# Patient Record
Sex: Male | Born: 1975 | Race: White | Hispanic: No | Smoking: Never smoker
Health system: Southern US, Community
[De-identification: ages and names within clinical notes are randomized; demographics above are authoritative.]

## PROBLEM LIST (undated history)

## (undated) DIAGNOSIS — F32A Depression, unspecified: Secondary | ICD-10-CM

## (undated) DIAGNOSIS — R079 Chest pain, unspecified: Secondary | ICD-10-CM

## (undated) DIAGNOSIS — I2510 Atherosclerotic heart disease of native coronary artery without angina pectoris: Secondary | ICD-10-CM

## (undated) DIAGNOSIS — E785 Hyperlipidemia, unspecified: Secondary | ICD-10-CM

## (undated) DIAGNOSIS — K219 Gastro-esophageal reflux disease without esophagitis: Secondary | ICD-10-CM

## (undated) DIAGNOSIS — F419 Anxiety disorder, unspecified: Secondary | ICD-10-CM

## (undated) DIAGNOSIS — F329 Major depressive disorder, single episode, unspecified: Secondary | ICD-10-CM

## (undated) DIAGNOSIS — N200 Calculus of kidney: Secondary | ICD-10-CM

## (undated) DIAGNOSIS — Z9289 Personal history of other medical treatment: Secondary | ICD-10-CM

## (undated) DIAGNOSIS — I700 Atherosclerosis of aorta: Secondary | ICD-10-CM

## (undated) HISTORY — DX: Depression, unspecified: F32.A

## (undated) HISTORY — DX: Hyperlipidemia, unspecified: E78.5

## (undated) HISTORY — DX: Personal history of other medical treatment: Z92.89

## (undated) HISTORY — DX: Calculus of kidney: N20.0

## (undated) HISTORY — PX: CHOLECYSTECTOMY: SHX55

## (undated) HISTORY — PX: OTHER SURGICAL HISTORY: SHX169

## (undated) HISTORY — DX: Atherosclerotic heart disease of native coronary artery without angina pectoris: I25.10

## (undated) HISTORY — PX: VASECTOMY: SHX75

## (undated) HISTORY — DX: Gastro-esophageal reflux disease without esophagitis: K21.9

## (undated) HISTORY — DX: Anxiety disorder, unspecified: F41.9

## (undated) HISTORY — DX: Chest pain, unspecified: R07.9

## (undated) HISTORY — DX: Major depressive disorder, single episode, unspecified: F32.9

---

## 2011-02-06 HISTORY — PX: CHOLECYSTECTOMY: SHX55

## 2017-06-12 ENCOUNTER — Encounter: Payer: Self-pay | Admitting: Internal Medicine

## 2017-06-12 ENCOUNTER — Telehealth: Payer: Self-pay | Admitting: *Deleted

## 2017-06-12 ENCOUNTER — Ambulatory Visit (INDEPENDENT_AMBULATORY_CARE_PROVIDER_SITE_OTHER): Payer: PRIVATE HEALTH INSURANCE | Admitting: Internal Medicine

## 2017-06-12 VITALS — BP 136/78 | HR 76 | Temp 98.5°F | Ht 66.0 in | Wt 217.6 lb

## 2017-06-12 DIAGNOSIS — Z87442 Personal history of urinary calculi: Secondary | ICD-10-CM | POA: Diagnosis not present

## 2017-06-12 DIAGNOSIS — D229 Melanocytic nevi, unspecified: Secondary | ICD-10-CM

## 2017-06-12 DIAGNOSIS — N419 Inflammatory disease of prostate, unspecified: Secondary | ICD-10-CM | POA: Diagnosis not present

## 2017-06-12 DIAGNOSIS — R6882 Decreased libido: Secondary | ICD-10-CM | POA: Insufficient documentation

## 2017-06-12 DIAGNOSIS — M25542 Pain in joints of left hand: Secondary | ICD-10-CM

## 2017-06-12 DIAGNOSIS — D239 Other benign neoplasm of skin, unspecified: Secondary | ICD-10-CM

## 2017-06-12 DIAGNOSIS — N401 Enlarged prostate with lower urinary tract symptoms: Secondary | ICD-10-CM | POA: Diagnosis not present

## 2017-06-12 DIAGNOSIS — E785 Hyperlipidemia, unspecified: Secondary | ICD-10-CM | POA: Insufficient documentation

## 2017-06-12 DIAGNOSIS — F419 Anxiety disorder, unspecified: Secondary | ICD-10-CM | POA: Insufficient documentation

## 2017-06-12 DIAGNOSIS — E559 Vitamin D deficiency, unspecified: Secondary | ICD-10-CM

## 2017-06-12 DIAGNOSIS — K219 Gastro-esophageal reflux disease without esophagitis: Secondary | ICD-10-CM | POA: Insufficient documentation

## 2017-06-12 DIAGNOSIS — E669 Obesity, unspecified: Secondary | ICD-10-CM | POA: Diagnosis not present

## 2017-06-12 DIAGNOSIS — M2550 Pain in unspecified joint: Secondary | ICD-10-CM | POA: Insufficient documentation

## 2017-06-12 DIAGNOSIS — E291 Testicular hypofunction: Secondary | ICD-10-CM

## 2017-06-12 DIAGNOSIS — Z23 Encounter for immunization: Secondary | ICD-10-CM

## 2017-06-12 DIAGNOSIS — E538 Deficiency of other specified B group vitamins: Secondary | ICD-10-CM

## 2017-06-12 DIAGNOSIS — R3912 Poor urinary stream: Secondary | ICD-10-CM | POA: Insufficient documentation

## 2017-06-12 DIAGNOSIS — M25541 Pain in joints of right hand: Secondary | ICD-10-CM

## 2017-06-12 DIAGNOSIS — F329 Major depressive disorder, single episode, unspecified: Secondary | ICD-10-CM

## 2017-06-12 DIAGNOSIS — Z1329 Encounter for screening for other suspected endocrine disorder: Secondary | ICD-10-CM

## 2017-06-12 HISTORY — DX: Gastro-esophageal reflux disease without esophagitis: K21.9

## 2017-06-12 HISTORY — DX: Melanocytic nevi, unspecified: D22.9

## 2017-06-12 MED ORDER — LOVASTATIN 20 MG PO TABS: 20.0000 mg | ORAL_TABLET | Freq: Every day | ORAL | 3 refills | Status: DC

## 2017-06-12 MED ORDER — PANTOPRAZOLE SODIUM 40 MG PO TBEC: 40.0000 mg | DELAYED_RELEASE_TABLET | Freq: Every day | ORAL | 1 refills | Status: DC

## 2017-06-12 MED ORDER — LORAZEPAM 1 MG PO TABS: 1.0000 mg | ORAL_TABLET | Freq: Every day | ORAL | 2 refills | Status: DC | PRN

## 2017-06-12 NOTE — Progress Notes (Signed)
Pre visit review using our clinic review tool, if applicable. No additional management support is needed unless otherwise documented below in the visit note. 

## 2017-06-12 NOTE — Telephone Encounter (Signed)
Please place future orders for fasting labs. Pt coming in the morning.  Thanks

## 2017-06-12 NOTE — Patient Instructions (Signed)
Try Insight Timer, headspace, or calm meditation apps for phone  Take care   Tdap/DTaP Vaccine (Diphtheria, Tetanus, and Pertussis): What You Need to Know 1. Why get vaccinated? Diphtheria, tetanus, and pertussis are serious diseases caused by bacteria. Diphtheria and pertussis are spread from person to person. Tetanus enters the body through cuts or wounds. DIPHTHERIA causes a thick covering in the back of the throat.  It can lead to breathing problems, paralysis, heart failure, and even death.  TETANUS (Lockjaw) causes painful tightening of the muscles, usually all over the body.  It can lead to "locking" of the jaw so the victim cannot open his mouth or swallow. Tetanus leads to death in up to 2 out of 10 cases.  PERTUSSIS (Whooping Cough) causes coughing spells so bad that it is hard for infants to eat, drink, or breathe. These spells can last for weeks.  It can lead to pneumonia, seizures (jerking and staring spells), brain damage, and death.  Diphtheria, tetanus, and pertussis vaccine (DTaP) can help prevent these diseases. Most children who are vaccinated with DTaP will be protected throughout childhood. Many more children would get these diseases if we stopped vaccinating. DTaP is a safer version of an older vaccine called DTP. DTP is no longer used in the [place]. 2. Who should get DTaP vaccine and when? Children should get 5 doses of DTaP vaccine, one dose at each of the following ages:  2 months  4 months  6 months  15-18 months  4-6 years  DTaP may be given at the same time as other vaccines. 3. Some children should not get DTaP vaccine or should wait  Children with minor illnesses, such as a cold, may be vaccinated. But children who are moderately or severely ill should usually wait until they recover before getting DTaP vaccine.  Any child who had a life-threatening allergic reaction after a dose of DTaP should not get another dose.  Any child who suffered  a brain or nervous system disease within 7 days after a dose of DTaP should not get another dose.  Talk with your doctor if your child: ? had a seizure or collapsed after a dose of DTaP, ? cried non-stop for 3 hours or more after a dose of DTaP, ? had a fever over 105F after a dose of DTaP. Ask your doctor for more information. Some of these children should not get another dose of pertussis vaccine, but may get a vaccine without pertussis, called DT. 4. Older children and adults DTaP is not licensed for adolescents, adults, or children 73 years of age and older. But older people still need protection. A vaccine called Tdap is similar to DTaP. A single dose of Tdap is recommended for people 11 through 40 years of age. Another vaccine, called Td, protects against tetanus and diphtheria, but not pertussis. It is recommended every 10 years. There are separate Vaccine Information Statements for these vaccines. 5. What are the risks from DTaP vaccine? Getting diphtheria, tetanus, or pertussis disease is much riskier than getting DTaP vaccine. However, a vaccine, like any medicine, is capable of causing serious problems, such as severe allergic reactions. The risk of DTaP vaccine causing serious harm, or death, is extremely small. Mild problems (common)  Fever (up to about 1 child in 4)  Redness or swelling where the shot was given (up to about 1 child in 4)  Soreness or tenderness where the shot was given (up to about 1 child in 4) These problems  occur more often after the 4th and 5th doses of the DTaP series than after earlier doses. Sometimes the 4th or 5th dose of DTaP vaccine is followed by swelling of the entire arm or leg in which the shot was given, lasting 1-7 days (up to about 1 child in 14). Other mild problems include:  Fussiness (up to about 1 child in 3)  Tiredness or poor appetite (up to about 1 child in 10)  Vomiting (up to about 1 child in 54) These problems generally occur 1-3  days after the shot. Moderate problems (uncommon)  Seizure (jerking or staring) (about 1 child out of 14,000)  Non-stop crying, for 3 hours or more (up to about 1 child out of 1,000)  High fever, over 105F (about 1 child out of 16,000) Severe problems (very rare)  Serious allergic reaction (less than 1 out of a million doses)  Several other severe problems have been reported after DTaP vaccine. These include: ? Long-term seizures, coma, or lowered consciousness ? Permanent brain damage. These are so rare it is hard to tell if they are caused by the vaccine. Controlling fever is especially important for children who have had seizures, for any reason. It is also important if another family member has had seizures. You can reduce fever and pain by giving your child an aspirin-free pain reliever when the shot is given, and for the next 24 hours, following the package instructions. 6. What if there is a serious reaction? What should I look for? Look for anything that concerns you, such as signs of a severe allergic reaction, very high fever, or behavior changes. Signs of a severe allergic reaction can include hives, swelling of the face and throat, difficulty breathing, a fast heartbeat, dizziness, and weakness. These would start a few minutes to a few hours after the vaccination. What should I do?  If you think it is a severe allergic reaction or other emergency that can't wait, call 9-1-1 or get the person to the nearest hospital. Otherwise, call your doctor.  Afterward, the reaction should be reported to the Vaccine Adverse Event Reporting System (VAERS). Your doctor might file this report, or you can do it yourself through the VAERS web site at www.vaers.SamedayNews.es, or by calling 7045004218. ? VAERS is only for reporting reactions. They do not give medical advice. 7. The National Vaccine Injury Compensation Program The [employer] Vaccine Injury Compensation Program (VICP) is a federal  program that was created to compensate people who may have been injured by certain vaccines. Persons who believe they may have been injured by a vaccine can learn about the program and about filing a claim by calling 408-332-6070 or visiting the [place] website at GoldCloset.com.ee. 8. How can I learn more?  Ask your doctor.  Call your local or state health department.  Contact the Centers for Disease Control and Prevention (CDC): ? Call (843)720-8026 (1-800-CDC-INFO) or ? Visit CDC's website at http://hunter.com/ CDC DTaP Vaccine (Diphtheria, Tetanus, and Pertussis) VIS (06/21/05) This information is not intended to replace advice given to you by your health care provider. Make sure you discuss any questions you have with your health care provider. Document Released: 11/19/2005 Document Revised: 10/13/2015 Document Reviewed: 10/13/2015 Elsevier Interactive Patient Education  2017 [place].  Generalized Anxiety Disorder, Adult Generalized anxiety disorder (GAD) is a mental health disorder. People with this condition constantly worry about everyday events. Unlike normal anxiety, worry related to GAD is not triggered by a specific event. These worries also do  not fade or get better with time. GAD interferes with life functions, including relationships, work, and school. GAD can vary from mild to severe. People with severe GAD can have intense waves of anxiety with physical symptoms (panic attacks). What are the causes? The exact cause of GAD is not known. What increases the risk? This condition is more likely to develop in:  Women.  People who have a family history of anxiety disorders.  People who are very shy.  People who experience very stressful life events, such as the death of a loved one.  People who have a very stressful family environment.  What are the signs or symptoms? People with GAD often worry excessively about many things in their lives, such as  their health and family. They may also be overly concerned about:  Doing well at work.  Being on time.  Natural disasters.  Friendships.  Physical symptoms of GAD include:  Fatigue.  Muscle tension or having muscle twitches.  Trembling or feeling shaky.  Being easily startled.  Feeling like your heart is pounding or racing.  Feeling out of breath or like you cannot take a deep breath.  Having trouble falling asleep or staying asleep.  Sweating.  Nausea, diarrhea, or irritable bowel syndrome (IBS).  Headaches.  Trouble concentrating or remembering facts.  Restlessness.  Irritability.  How is this diagnosed? Your health care provider can diagnose GAD based on your symptoms and medical history. You will also have a physical exam. The health care provider will ask specific questions about your symptoms, including how severe they are, when they started, and if they come and go. Your health care provider may ask you about your use of alcohol or drugs, including prescription medicines. Your health care provider may refer you to a mental health specialist for further evaluation. Your health care provider will do a thorough examination and may perform additional tests to rule out other possible causes of your symptoms. To be diagnosed with GAD, a person must have anxiety that:  Is out of his or her control.  Affects several different aspects of his or her life, such as work and relationships.  Causes distress that makes him or her unable to take part in normal activities.  Includes at least three physical symptoms of GAD, such as restlessness, fatigue, trouble concentrating, irritability, muscle tension, or sleep problems.  Before your health care provider can confirm a diagnosis of GAD, these symptoms must be present more days than they are not, and they must last for six months or longer. How is this treated? The following therapies are usually used to treat  GAD:  Medicine. Antidepressant medicine is usually prescribed for long-term daily control. Antianxiety medicines may be added in severe cases, especially when panic attacks occur.  Talk therapy (psychotherapy). Certain types of talk therapy can be helpful in treating GAD by providing support, education, and guidance. Options include: ? Cognitive behavioral therapy (CBT). People learn coping skills and techniques to ease their anxiety. They learn to identify unrealistic or negative thoughts and behaviors and to replace them with positive ones. ? Acceptance and commitment therapy (ACT). This treatment teaches people how to be mindful as a way to cope with unwanted thoughts and feelings. ? Biofeedback. This process trains you to manage your body's response (physiological response) through breathing techniques and relaxation methods. You will work with a therapist while machines are used to monitor your physical symptoms.  Stress management techniques. These include yoga, meditation, and exercise.  A  mental health specialist can help determine which treatment is best for you. Some people see improvement with one type of therapy. However, other people require a combination of therapies. Follow these instructions at home:  Take over-the-counter and prescription medicines only as told by your health care provider.  Try to maintain a normal routine.  Try to anticipate stressful situations and allow extra time to manage them.  Practice any stress management or self-calming techniques as taught by your health care provider.  Do not punish yourself for setbacks or for not making progress.  Try to recognize your accomplishments, even if they are small.  Keep all follow-up visits as told by your health care provider. This is important. Contact a health care provider if:  Your symptoms do not get better.  Your symptoms get worse.  You have signs of depression, such as: ? A persistently sad,  cranky, or irritable mood. ? Loss of enjoyment in activities that used to bring you joy. ? Change in weight or eating. ? Changes in sleeping habits. ? Avoiding friends or family members. ? Loss of energy for normal tasks. ? Feelings of guilt or worthlessness. Get help right away if:  You have serious thoughts about hurting yourself or others. If you ever feel like you may hurt yourself or others, or have thoughts about taking your own life, get help right away. You can go to your nearest emergency department or call:  Your local emergency services (911 in the [place]  A suicide crisis helpline, such as the [facility] at (615)699-7168. This is open 24 hours a day.  Summary  Generalized anxiety disorder (GAD) is a mental health disorder that involves worry that is not triggered by a specific event.  People with GAD often worry excessively about many things in their lives, such as their health and family.  GAD may cause physical symptoms such as restlessness, trouble concentrating, sleep problems, frequent sweating, nausea, diarrhea, headaches, and trembling or muscle twitching.  A mental health specialist can help determine which treatment is best for you. Some people see improvement with one type of therapy. However, other people require a combination of therapies. This information is not intended to replace advice given to you by your health care provider. Make sure you discuss any questions you have with your health care provider. Document Released: 05/19/2012 Document Revised: 12/13/2015 Document Reviewed: 12/13/2015 Elsevier Interactive Patient Education  2018 [place] After being diagnosed with an anxiety disorder, you may be relieved to know why you have felt or behaved a certain way. It is natural to also feel overwhelmed about the treatment ahead and what it will mean for your life. With care and support, you can manage this  condition and recover from it. How to cope with anxiety Dealing with stress Stress is your body's reaction to life changes and events, both good and bad. Stress can last just a few hours or it can be ongoing. Stress can play a major role in anxiety, so it is important to learn both how to cope with stress and how to think about it differently. Talk with your health care provider or a counselor to learn more about stress reduction. He or she may suggest some stress reduction techniques, such as:  Music therapy. This can include creating or listening to music that you enjoy and that inspires you.  Mindfulness-based meditation. This involves being aware of your normal breaths, rather than trying to control your breathing.  It can be done while sitting or walking.  Centering prayer. This is a kind of meditation that involves focusing on a word, phrase, or sacred image that is meaningful to you and that brings you peace.  Deep breathing. To do this, expand your stomach and inhale slowly through your nose. Hold your breath for 3-5 seconds. Then exhale slowly, allowing your stomach muscles to relax.  Self-talk. This is a skill where you identify thought patterns that lead to anxiety reactions and correct those thoughts.  Muscle relaxation. This involves tensing muscles then relaxing them.  Choose a stress reduction technique that fits your lifestyle and personality. Stress reduction techniques take time and practice. Set aside 5-15 minutes a day to do them. Therapists can offer training in these techniques. The training may be covered by some insurance plans. Other things you can do to manage stress include:  Keeping a stress diary. This can help you learn what triggers your stress and ways to control your response.  Thinking about how you respond to certain situations. You may not be able to control everything, but you can control your reaction.  Making time for activities that help you relax, and  not feeling guilty about spending your time in this way.  Therapy combined with coping and stress-reduction skills provides the best chance for successful treatment. Medicines Medicines can help ease symptoms. Medicines for anxiety include:  Anti-anxiety drugs.  Antidepressants.  Beta-blockers.  Medicines may be used as the main treatment for anxiety disorder, along with therapy, or if other treatments are not working. Medicines should be prescribed by a health care provider. Relationships Relationships can play a big part in helping you recover. Try to spend more time connecting with trusted friends and family members. Consider going to couples counseling, taking family education classes, or going to family therapy. Therapy can help you and others better understand the condition. How to recognize changes in your condition Everyone has a different response to treatment for anxiety. Recovery from anxiety happens when symptoms decrease and stop interfering with your daily activities at home or work. This may mean that you will start to:  Have better concentration and focus.  Sleep better.  Be less irritable.  Have more energy.  Have improved memory.  It is important to recognize when your condition is getting worse. Contact your health care provider if your symptoms interfere with home or work and you do not feel like your condition is improving. Where to find help and support: You can get help and support from these sources:  Self-help groups.  Online and [employer].  A trusted spiritual leader.  Couples counseling.  Family education classes.  Family therapy.  Follow these instructions at home:  Eat a healthy diet that includes plenty of vegetables, fruits, whole grains, low-fat dairy products, and lean protein. Do not eat a lot of foods that are high in solid fats, added sugars, or salt.  Exercise. Most adults should do the following: ? Exercise for at  least 150 minutes each week. The exercise should increase your heart rate and make you sweat (moderate-intensity exercise). ? Strengthening exercises at least twice a week.  Cut down on caffeine, tobacco, alcohol, and other potentially harmful substances.  Get the right amount and quality of sleep. Most adults need 7-9 hours of sleep each night.  Make choices that simplify your life.  Take over-the-counter and prescription medicines only as told by your health care provider.  Avoid caffeine, alcohol, and certain over-the-counter cold  medicines. These may make you feel worse. Ask your pharmacist which medicines to avoid.  Keep all follow-up visits as told by your health care provider. This is important. Questions to ask your health care provider  Would I benefit from therapy?  How often should I follow up with a health care provider?  How long do I need to take medicine?  Are there any long-term side effects of my medicine?  Are there any alternatives to taking medicine? Contact a health care provider if:  You have a hard time staying focused or finishing daily tasks.  You spend many hours a day feeling worried about everyday life.  You become exhausted by worry.  You start to have headaches, feel tense, or have nausea.  You urinate more than normal.  You have diarrhea. Get help right away if:  You have a racing heart and shortness of breath.  You have thoughts of hurting yourself or others. If you ever feel like you may hurt yourself or others, or have thoughts about taking your own life, get help right away. You can go to your nearest emergency department or call:  Your local emergency services (911 in the [place]  A suicide crisis helpline, such as the [facility] at (936)197-4571. This is open 24-hours a day.  Summary  Taking steps to deal with stress can help calm you.  Medicines cannot cure anxiety disorders, but they can help ease  symptoms.  Family, friends, and partners can play a big part in helping you recover from an anxiety disorder. This information is not intended to replace advice given to you by your health care provider. Make sure you discuss any questions you have with your health care provider. Document Released: 01/17/2016 Document Revised: 01/17/2016 Document Reviewed: 01/17/2016 Elsevier Interactive Patient Education  [employer].

## 2017-06-12 NOTE — Telephone Encounter (Signed)
Orders in   TMS 

## 2017-06-12 NOTE — Progress Notes (Signed)
Chief Complaint  Patient presents with  . New Patient (Initial Visit)   New patient  1. GAD x 5-6 years more than depression but PHQ 9 score 13 today takes 1/2-1/4 to 1 whole Ativan 1 mg prn. He tried Celexa in the past but messed with sex drive which he is worried about with other medications similar to this. He also tried therapy in the past which he did not find beneficial  2. S/p wt loss surgery in 2013 was 280 now up to 217.6 and has gained some weight back.  3. GERD on prilosec 20 mg not effective and requests to be back on protonix 40 mg which helped  4. C/o joint pain in b/l PIP joints middle fingers both hands  5. He c/o nocturia, weak stream, reduced sex drive prostate pressure and h/o kidney stones calcium citrate. He feels like empyting bladder ok  6. HLD on lovastatin 20 mg qhs  Review of Systems  Constitutional: Negative for weight loss.  HENT: Negative for hearing loss.   Eyes: Negative for blurred vision.  Respiratory: Negative for shortness of breath.   Cardiovascular: Negative for chest pain.  Gastrointestinal: Positive for heartburn. Negative for abdominal pain.  Genitourinary:       +weak stream +nocturia  +reduced sex drive  +prostate pressure   Musculoskeletal: Positive for joint pain.  Skin:       +moles   Neurological: Negative for headaches.  Endo/Heme/Allergies: Positive for environmental allergies.  Psychiatric/Behavioral: Positive for depression. The patient is nervous/anxious. The patient does not have insomnia.    Past Medical History:  Diagnosis Date  . Anxiety   . Depression   . GERD (gastroesophageal reflux disease)   . Hyperlipidemia    Past Surgical History:  Procedure Laterality Date  . vertical sleeve gastrectomy     2013 85% stomach removed was 280 lbs before surgery    Family History  Problem Relation Age of Onset  . Diabetes Father   . Heart disease Father   . Hyperlipidemia Father   . Hypertension Father   . Heart disease  Brother    Social History   Socioeconomic History  . Marital status: Married    Spouse name: Not on file  . Number of children: Not on file  . Years of education: Not on file  . Highest education level: Not on file  Occupational History  . Occupation: medical coding     Comment: [employer]  . Financial resource strain: Not on file  . Food insecurity:    Worry: Not on file    Inability: Not on file  . Transportation needs:    Medical: Not on file    Non-medical: Not on file  Tobacco Use  . Smoking status: Never Smoker  . Smokeless tobacco: Never Used  Substance and Sexual Activity  . Alcohol use: Yes    Comment: occas. 2-3 x per year   . Drug use: Not Currently  . Sexual activity: Yes    Partners: Female  Lifestyle  . Physical activity:    Days per week: Not on file    Minutes per session: Not on file  . Stress: Not on file  Relationships  . Social connections:    Talks on phone: Not on file    Gets together: Not on file    Attends religious service: Not on file    Active member of club or organization: Not on file    Attends meetings of clubs or  organizations: Not on file    Relationship status: Not on file  . Intimate partner violence:    Fear of current or ex partner: Not on file    Emotionally abused: Not on file    Physically abused: Not on file    Forced sexual activity: Not on file  Other Topics Concern  . Not on file  Social History Narrative   Moved from [place]    Married    Some college    Careers information officer for [employer]   Current Meds  Medication Sig  . aspirin EC 81 MG tablet Take 81 mg by mouth daily. Taking 5 days per week  . fluticasone (FLONASE) 50 MCG/ACT nasal spray Place into both nostrils daily.  Marland Kitchen L-Arginine 500 MG CAPS Take 1,000 mg by mouth daily.   Marland Kitchen lovastatin (MEVACOR) 20 MG tablet Take 1 tablet (20 mg total) by mouth at bedtime.  . triamcinolone (NASACORT ALLERGY 24HR CHILDREN) 55 MCG/ACT AERO nasal inhaler Place 2 sprays  into the nose daily.  Marland Kitchen UNABLE TO FIND 400 mg daily. Med Name: SAMe Supplement per pt for anxiety  . YOHIMBINE HCL PO Take 5 mg by mouth daily.  . [DISCONTINUED] LORazepam (ATIVAN) 1 MG tablet Take 1 mg by mouth as needed for anxiety.  . [DISCONTINUED] lovastatin (MEVACOR) 20 MG tablet Take 20 mg by mouth at bedtime.  . [DISCONTINUED] omeprazole (PRILOSEC) 20 MG capsule Take 20 mg by mouth daily.   No Known Allergies No results found for this or any previous visit (from the past 2160 hour(s)). Objective  Body mass index is 35.12 kg/m. Wt Readings from Last 3 Encounters:  06/12/17 217 lb 9.6 oz (98.7 kg)   Temp Readings from Last 3 Encounters:  06/12/17 98.5 F (36.9 C) (Oral)   BP Readings from Last 3 Encounters:  06/12/17 136/78   Pulse Readings from Last 3 Encounters:  06/12/17 76    Physical Exam  Constitutional: He is oriented to person, place, and time. Vital signs are normal. He appears well-developed and well-nourished. He is cooperative.  HENT:  Head: Normocephalic and atraumatic.  Mouth/Throat: Oropharynx is clear and moist and mucous membranes are normal.  Eyes: Pupils are equal, round, and reactive to light. Conjunctivae are normal.  Cardiovascular: Normal rate, regular rhythm and normal heart sounds.  Pulmonary/Chest: Effort normal and breath sounds normal.  Neurological: He is alert and oriented to person, place, and time. Gait normal.  Skin: Skin is warm, dry and intact.  Multiple nevi to trunk   Psychiatric: He has a normal mood and affect. His speech is normal and behavior is normal. Judgment and thought content normal. Cognition and memory are normal.  Nursing note and vitals reviewed.   Assessment   1. GERD 2. HLD  3. Anxiety/depression PHQ 13 per pt has more anxiety than depression  4. Obesity BMI >35 s/p wt loss surgery 2013  5. Arthralgia b/l PIP joints middle fingers b/l  6. Nocturia (1x qhs), reduced sex drive and prostate pressure and weak stream  (ddx prostatitis vs BPH) with h/o kidney stones calcium citrate  7. Multiple nevi to trunk 8. HM Plan   1. Failed omeprazole 20 mg not effective per pt change to protonix 40 mg qd prev. On and helped better  2. Check fasting labs  Refilled lovastatin 20 mg qhs  Given cholesterol handout  3.  Referred to [facility]  Pt declines to try SSRIs 2/2 sexual side effects  Disc meditation  Prn Ativan 1 mg  4. Disc exercise to lose  5. RA w/u  Consider Xrays hands in future  6.  Referred to urology  Check PSA and UA and testosterone levels  7.  Referred derm Dr. Kellie Moor  8.  Had flu shot 11/21/16  Tdap given today  Never smoker  Had MMR and hep B shot 2nd series with work f/u with job titers in future -may want to get records of titer in future    Provider: Dr. Olivia Mackie McLean-[facility]oc[facility]

## 2017-06-13 ENCOUNTER — Other Ambulatory Visit (INDEPENDENT_AMBULATORY_CARE_PROVIDER_SITE_OTHER): Payer: PRIVATE HEALTH INSURANCE

## 2017-06-13 ENCOUNTER — Other Ambulatory Visit: Payer: Self-pay | Admitting: Internal Medicine

## 2017-06-13 DIAGNOSIS — N419 Inflammatory disease of prostate, unspecified: Secondary | ICD-10-CM | POA: Diagnosis not present

## 2017-06-13 DIAGNOSIS — E538 Deficiency of other specified B group vitamins: Secondary | ICD-10-CM | POA: Diagnosis not present

## 2017-06-13 DIAGNOSIS — Z87442 Personal history of urinary calculi: Secondary | ICD-10-CM

## 2017-06-13 DIAGNOSIS — E291 Testicular hypofunction: Secondary | ICD-10-CM

## 2017-06-13 DIAGNOSIS — E559 Vitamin D deficiency, unspecified: Secondary | ICD-10-CM | POA: Diagnosis not present

## 2017-06-13 DIAGNOSIS — F329 Major depressive disorder, single episode, unspecified: Secondary | ICD-10-CM

## 2017-06-13 DIAGNOSIS — E785 Hyperlipidemia, unspecified: Secondary | ICD-10-CM

## 2017-06-13 DIAGNOSIS — Z1329 Encounter for screening for other suspected endocrine disorder: Secondary | ICD-10-CM

## 2017-06-13 DIAGNOSIS — R6882 Decreased libido: Secondary | ICD-10-CM

## 2017-06-13 DIAGNOSIS — F419 Anxiety disorder, unspecified: Secondary | ICD-10-CM

## 2017-06-13 DIAGNOSIS — M25541 Pain in joints of right hand: Secondary | ICD-10-CM

## 2017-06-13 DIAGNOSIS — M25542 Pain in joints of left hand: Secondary | ICD-10-CM

## 2017-06-13 DIAGNOSIS — R3912 Poor urinary stream: Secondary | ICD-10-CM

## 2017-06-13 LAB — COMPREHENSIVE METABOLIC PANEL
ALT: 13 U/L (ref 0–53)
AST: 13 U/L (ref 0–37)
Albumin: 4.4 g/dL (ref 3.5–5.2)
Alkaline Phosphatase: 67 U/L (ref 39–117)
BUN: 11 mg/dL (ref 6–23)
CO2: 32 mEq/L (ref 19–32)
Calcium: 9.5 mg/dL (ref 8.4–10.5)
Chloride: 103 mEq/L (ref 96–112)
Creatinine, Ser: 0.88 mg/dL (ref 0.40–1.50)
GFR: 102.1 mL/min (ref >60.00)
Glucose, Bld: 100 mg/dL — ABNORMAL HIGH (ref 70–99)
Potassium: 4.3 mEq/L (ref 3.5–5.1)
Sodium: 140 mEq/L (ref 135–145)
Total Bilirubin: 0.8 mg/dL (ref 0.2–1.2)
Total Protein: 7.1 g/dL (ref 6.0–8.3)

## 2017-06-13 LAB — CBC WITH DIFFERENTIAL/PLATELET
Basophils Absolute: 0 10*3/uL (ref 0.0–0.1)
Basophils Relative: 0.6 % (ref 0.0–3.0)
Eosinophils Absolute: 0.5 10*3/uL (ref 0.0–0.7)
Eosinophils Relative: 7 % — ABNORMAL HIGH (ref 0.0–5.0)
HCT: 44.4 % (ref 39.0–52.0)
Hemoglobin: 15.5 g/dL (ref 13.0–17.0)
Lymphocytes Relative: 30.9 % (ref 12.0–46.0)
Lymphs Abs: 2.1 10*3/uL (ref 0.7–4.0)
MCHC: 35 g/dL (ref 30.0–36.0)
MCV: 88.8 fl (ref 78.0–100.0)
Monocytes Absolute: 0.5 10*3/uL (ref 0.1–1.0)
Monocytes Relative: 7.4 % (ref 3.0–12.0)
Neutro Abs: 3.6 10*3/uL (ref 1.4–7.7)
Neutrophils Relative %: 54.1 % (ref 43.0–77.0)
Platelets: 235 10*3/uL (ref 150.0–400.0)
RBC: 5 Mil/uL (ref 4.22–5.81)
RDW: 14 % (ref 11.5–15.5)
WBC: 6.7 10*3/uL (ref 4.0–10.5)

## 2017-06-13 LAB — LIPID PANEL
Cholesterol: 218 mg/dL — ABNORMAL HIGH (ref 0–200)
HDL: 42.8 mg/dL (ref >39.00)
LDL Cholesterol: 140 mg/dL — ABNORMAL HIGH (ref 0–99)
NonHDL: 174.73
Total CHOL/HDL Ratio: 5
Triglycerides: 173 mg/dL — ABNORMAL HIGH (ref 0.0–149.0)
VLDL: 34.6 mg/dL (ref 0.0–40.0)

## 2017-06-13 LAB — SEDIMENTATION RATE: Sed Rate: 3 mm/hr (ref 0–15)

## 2017-06-13 LAB — C-REACTIVE PROTEIN: CRP: 0.2 mg/dL — ABNORMAL LOW (ref 0.5–20.0)

## 2017-06-13 LAB — VITAMIN D 25 HYDROXY (VIT D DEFICIENCY, FRACTURES): VITD: 25.13 ng/mL — ABNORMAL LOW (ref 30.00–100.00)

## 2017-06-13 LAB — TSH: TSH: 1.67 u[IU]/mL (ref 0.35–4.50)

## 2017-06-13 LAB — T4, FREE: Free T4: 0.81 ng/dL (ref 0.60–1.60)

## 2017-06-13 LAB — VITAMIN B12: Vitamin B-12: 285 pg/mL (ref 211–911)

## 2017-06-13 LAB — PSA: PSA: 0.78 ng/mL (ref 0.10–4.00)

## 2017-06-13 MED ORDER — LOVASTATIN 40 MG PO TABS: 40.0000 mg | ORAL_TABLET | Freq: Every day | ORAL | 1 refills | Status: DC

## 2017-06-14 LAB — URINALYSIS, ROUTINE W REFLEX MICROSCOPIC
Bilirubin, UA: NEGATIVE
Glucose, UA: NEGATIVE
Ketones, UA: NEGATIVE
Leukocytes, UA: NEGATIVE
Nitrite, UA: NEGATIVE
Protein, UA: NEGATIVE
RBC, UA: NEGATIVE
Specific Gravity, UA: <=1.005 — AB (ref 1.005–1.030)
Urobilinogen, Ur: 0.2 mg/dL (ref 0.2–1.0)
pH, UA: 7.5 (ref 5.0–7.5)

## 2017-06-14 LAB — TESTOSTERONE TOTAL,FREE,BIO, MALES
Albumin: 4.4 g/dL (ref 3.6–5.1)
Sex Hormone Binding: 31 nmol/L (ref 10–50)
Testosterone, Bioavailable: 147.9 ng/dL (ref >=110.0)
Testosterone, Free: 73.5 pg/mL (ref 46.0–224.0)
Testosterone: 511 ng/dL (ref 250–827)

## 2017-06-16 LAB — ANA: Anti Nuclear Antibody(ANA): NEGATIVE

## 2017-06-16 LAB — RHEUMATOID FACTOR: Rhuematoid fact SerPl-aCnc: <14 IU/mL (ref <14)

## 2017-06-16 LAB — CYCLIC CITRUL PEPTIDE ANTIBODY, IGG: Cyclic Citrullin Peptide Ab: <16 UNITS

## 2017-06-20 ENCOUNTER — Other Ambulatory Visit: Payer: Self-pay | Admitting: Internal Medicine

## 2017-06-20 DIAGNOSIS — K219 Gastro-esophageal reflux disease without esophagitis: Secondary | ICD-10-CM

## 2017-06-20 MED ORDER — PANTOPRAZOLE SODIUM 40 MG PO TBEC: 40.0000 mg | DELAYED_RELEASE_TABLET | Freq: Every day | ORAL | 1 refills | Status: DC

## 2017-06-20 MED ORDER — ESOMEPRAZOLE MAGNESIUM 40 MG PO CPDR: 40.0000 mg | DELAYED_RELEASE_CAPSULE | Freq: Every day | ORAL | 1 refills | Status: DC

## 2017-06-21 ENCOUNTER — Other Ambulatory Visit: Payer: Self-pay | Admitting: Internal Medicine

## 2017-06-28 ENCOUNTER — Ambulatory Visit: Payer: No Typology Code available for payment source | Admitting: Psychology

## 2017-07-23 ENCOUNTER — Ambulatory Visit: Payer: Self-pay | Admitting: Urology

## 2017-09-12 ENCOUNTER — Ambulatory Visit: Payer: PRIVATE HEALTH INSURANCE | Admitting: Internal Medicine

## 2017-10-21 ENCOUNTER — Other Ambulatory Visit: Payer: Self-pay | Admitting: Internal Medicine

## 2017-10-21 ENCOUNTER — Encounter: Payer: Self-pay | Admitting: Internal Medicine

## 2017-10-21 DIAGNOSIS — F419 Anxiety disorder, unspecified: Secondary | ICD-10-CM

## 2017-10-21 DIAGNOSIS — F329 Major depressive disorder, single episode, unspecified: Secondary | ICD-10-CM

## 2017-10-21 MED ORDER — BUPROPION HCL ER (XL) 150 MG PO TB24: 150.0000 mg | ORAL_TABLET | Freq: Every day | ORAL | 2 refills | Status: DC

## 2017-11-06 ENCOUNTER — Encounter: Payer: Self-pay | Admitting: Internal Medicine

## 2017-12-04 ENCOUNTER — Encounter: Payer: Self-pay | Admitting: Internal Medicine

## 2017-12-04 ENCOUNTER — Ambulatory Visit (INDEPENDENT_AMBULATORY_CARE_PROVIDER_SITE_OTHER): Payer: PRIVATE HEALTH INSURANCE | Admitting: Internal Medicine

## 2017-12-04 VITALS — BP 124/84 | HR 70 | Temp 98.1°F | Ht 66.0 in | Wt 202.6 lb

## 2017-12-04 DIAGNOSIS — F329 Major depressive disorder, single episode, unspecified: Secondary | ICD-10-CM

## 2017-12-04 DIAGNOSIS — F419 Anxiety disorder, unspecified: Secondary | ICD-10-CM

## 2017-12-04 DIAGNOSIS — E785 Hyperlipidemia, unspecified: Secondary | ICD-10-CM

## 2017-12-04 DIAGNOSIS — D229 Melanocytic nevi, unspecified: Secondary | ICD-10-CM

## 2017-12-04 MED ORDER — BUPROPION HCL 75 MG PO TABS: 75.0000 mg | ORAL_TABLET | Freq: Every day | ORAL | 12 refills | Status: DC

## 2017-12-04 MED ORDER — LORAZEPAM 1 MG PO TABS: 1.0000 mg | ORAL_TABLET | Freq: Every day | ORAL | 2 refills | Status: DC | PRN

## 2017-12-04 NOTE — Patient Instructions (Addendum)
L theanine  -[employer] or Whole Foods supplement for anxiety and sleep  D3 5000 IU daily  Look into a diffuser with [facility]  [facility] [place] check with insurance  My chart me in 1-2 weeks and let me know what is going on with headaches     Mediterranean Diet A Mediterranean diet refers to food and lifestyle choices that are based on the traditions of countries located on the [employer]. This way of eating has been shown to help prevent certain conditions and improve outcomes for people who have chronic diseases, like kidney disease and heart disease. What are tips for following this plan? Lifestyle  Cook and eat meals together with your family, when possible.  Drink enough fluid to keep your urine clear or pale yellow.  Be physically active every day. This includes: ? Aerobic exercise like running or swimming. ? Leisure activities like gardening, walking, or housework.  Get 7-8 hours of sleep each night.  If recommended by your health care provider, drink red wine in moderation. This means 1 glass a day for nonpregnant women and 2 glasses a day for men. A glass of wine equals 5 oz (150 mL). Reading food labels  Check the serving size of packaged foods. For foods such as rice and pasta, the serving size refers to the amount of cooked product, not dry.  Check the total fat in packaged foods. Avoid foods that have saturated fat or trans fats.  Check the ingredients list for added sugars, such as corn syrup. Shopping  At the grocery store, buy most of your food from the areas near the walls of the store. This includes: ? Fresh fruits and vegetables (produce). ? Grains, beans, nuts, and seeds. Some of these may be available in unpackaged forms or large amounts (in bulk). ? Fresh seafood. ? Poultry and eggs. ? Low-fat dairy products.  Buy whole ingredients instead of prepackaged foods.  Buy fresh fruits and vegetables in-season from local farmers  markets.  Buy frozen fruits and vegetables in resealable bags.  If you do not have access to quality fresh seafood, buy precooked frozen shrimp or canned fish, such as tuna, salmon, or sardines.  Buy small amounts of raw or cooked vegetables, salads, or olives from the deli or salad bar at your store.  Stock your pantry so you always have certain foods on hand, such as olive oil, canned tuna, canned tomatoes, rice, pasta, and beans. Cooking  Cook foods with extra-virgin olive oil instead of using butter or other vegetable oils.  Have meat as a side dish, and have vegetables or grains as your main dish. This means having meat in small portions or adding small amounts of meat to foods like pasta or stew.  Use beans or vegetables instead of meat in common dishes like chili or lasagna.  Experiment with different cooking methods. Try roasting or broiling vegetables instead of steaming or sauteing them.  Add frozen vegetables to soups, stews, pasta, or rice.  Add nuts or seeds for added healthy fat at each meal. You can add these to yogurt, salads, or vegetable dishes.  Marinate fish or vegetables using olive oil, lemon juice, garlic, and fresh herbs. Meal planning  Plan to eat 1 vegetarian meal one day each week. Try to work up to 2 vegetarian meals, if possible.  Eat seafood 2 or more times a week.  Have healthy snacks readily available, such as: ? Vegetable sticks with hummus. ? [place] yogurt. ? Fruit  and nut trail mix.  Eat balanced meals throughout the week. This includes: ? Fruit: 2-3 servings a day ? Vegetables: 4-5 servings a day ? Low-fat dairy: 2 servings a day ? Fish, poultry, or lean meat: 1 serving a day ? Beans and legumes: 2 or more servings a week ? Nuts and seeds: 1-2 servings a day ? Whole grains: 6-8 servings a day ? Extra-virgin olive oil: 3-4 servings a day  Limit red meat and sweets to only a few servings a month What are my food choices?  Mediterranean  diet ? Recommended ? Grains: Whole-grain pasta. Brown rice. Bulgar wheat. Polenta. Couscous. Whole-wheat bread. [place]. ? Vegetables: Artichokes. Beets. Broccoli. Cabbage. Carrots. Eggplant. Green beans. Chard. Kale. Spinach. Onions. Leeks. Peas. Squash. Tomatoes. Peppers. Radishes. ? Fruits: Apples. Apricots. Avocado. Berries. Bananas. Cherries. Dates. Figs. Grapes. Lemons. Melon. Oranges. Peaches. Plums. Pomegranate. ? Meats and other protein foods: Beans. Almonds. Sunflower seeds. Pine nuts. Peanuts. [place]. Salmon. Scallops. Shrimp. [place]. Tilapia. Clams. Oysters. Eggs. ? Dairy: Low-fat milk. Cheese. Greek yogurt. ? Beverages: Water. Red wine. Herbal tea. ? Fats and oils: Extra virgin olive oil. Avocado oil. Grape seed oil. ? Sweets and desserts: [place] yogurt with honey. Baked apples. Poached pears. Trail mix. ? Seasoning and other foods: Basil. Cilantro. Coriander. Cumin. Mint. Parsley. Sage. Rosemary. Tarragon. Garlic. Oregano. Thyme. Pepper. Balsalmic vinegar. Tahini. Hummus. Tomato sauce. Olives. Mushrooms. ? Limit these ? Grains: Prepackaged pasta or rice dishes. Prepackaged cereal with added sugar. ? Vegetables: Deep fried potatoes (french fries). ? Fruits: Fruit canned in syrup. ? Meats and other protein foods: Beef. Pork. Lamb. Poultry with skin. Hot dogs. [place] Salines. ? Dairy: Ice cream. Sour cream. Whole milk. ? Beverages: Juice. Sugar-sweetened soft drinks. Beer. Liquor and spirits. ? Fats and oils: Butter. Canola oil. Vegetable oil. Beef fat (tallow). Lard. ? Sweets and desserts: Cookies. Cakes. Pies. Candy. ? Seasoning and other foods: Mayonnaise. Premade sauces and marinades. ? The items listed may not be a complete list. Talk with your dietitian about what dietary choices are right for you. Summary  The Mediterranean diet includes both food and lifestyle choices.  Eat a variety of fresh fruits and vegetables, beans, nuts, seeds, and whole grains.  Limit the amount of red  meat and sweets that you eat.  Talk with your health care provider about whether it is safe for you to drink red wine in moderation. This means 1 glass a day for nonpregnant women and 2 glasses a day for men. A glass of wine equals 5 oz (150 mL). This information is not intended to replace advice given to you by your health care provider. Make sure you discuss any questions you have with your health care provider. Document Released: 09/15/2015 Document Revised: 10/18/2015 Document Reviewed: 09/15/2015 Elsevier Interactive Patient Education  2018 [place].   Generalized Anxiety Disorder, Adult Generalized anxiety disorder (GAD) is a mental health disorder. People with this condition constantly worry about everyday events. Unlike normal anxiety, worry related to GAD is not triggered by a specific event. These worries also do not fade or get better with time. GAD interferes with life functions, including relationships, work, and school. GAD can vary from mild to severe. People with severe GAD can have intense waves of anxiety with physical symptoms (panic attacks). What are the causes? The exact cause of GAD is not known. What increases the risk? This condition is more likely to develop in:  Women.  People who have a family history of anxiety disorders.  People who are very shy.  People who experience very stressful life events, such as the death of a loved one.  People who have a very stressful family environment.  What are the signs or symptoms? People with GAD often worry excessively about many things in their lives, such as their health and family. They may also be overly concerned about:  Doing well at work.  Being on time.  Natural disasters.  Friendships.  Physical symptoms of GAD include:  Fatigue.  Muscle tension or having muscle twitches.  Trembling or feeling shaky.  Being easily startled.  Feeling like your heart is pounding or racing.  Feeling out of  breath or like you cannot take a deep breath.  Having trouble falling asleep or staying asleep.  Sweating.  Nausea, diarrhea, or irritable bowel syndrome (IBS).  Headaches.  Trouble concentrating or remembering facts.  Restlessness.  Irritability.  How is this diagnosed? Your health care provider can diagnose GAD based on your symptoms and medical history. You will also have a physical exam. The health care provider will ask specific questions about your symptoms, including how severe they are, when they started, and if they come and go. Your health care provider may ask you about your use of alcohol or drugs, including prescription medicines. Your health care provider may refer you to a mental health specialist for further evaluation. Your health care provider will do a thorough examination and may perform additional tests to rule out other possible causes of your symptoms. To be diagnosed with GAD, a person must have anxiety that:  Is out of his or her control.  Affects several different aspects of his or her life, such as work and relationships.  Causes distress that makes him or her unable to take part in normal activities.  Includes at least three physical symptoms of GAD, such as restlessness, fatigue, trouble concentrating, irritability, muscle tension, or sleep problems.  Before your health care provider can confirm a diagnosis of GAD, these symptoms must be present more days than they are not, and they must last for six months or longer. How is this treated? The following therapies are usually used to treat GAD:  Medicine. Antidepressant medicine is usually prescribed for long-term daily control. Antianxiety medicines may be added in severe cases, especially when panic attacks occur.  Talk therapy (psychotherapy). Certain types of talk therapy can be helpful in treating GAD by providing support, education, and guidance. Options include: ? Cognitive behavioral therapy  (CBT). People learn coping skills and techniques to ease their anxiety. They learn to identify unrealistic or negative thoughts and behaviors and to replace them with positive ones. ? Acceptance and commitment therapy (ACT). This treatment teaches people how to be mindful as a way to cope with unwanted thoughts and feelings. ? Biofeedback. This process trains you to manage your body's response (physiological response) through breathing techniques and relaxation methods. You will work with a therapist while machines are used to monitor your physical symptoms.  Stress management techniques. These include yoga, meditation, and exercise.  A mental health specialist can help determine which treatment is best for you. Some people see improvement with one type of therapy. However, other people require a combination of therapies. Follow these instructions at home:  Take over-the-counter and prescription medicines only as told by your health care provider.  Try to maintain a normal routine.  Try to anticipate stressful situations and allow extra time to manage them.  Practice any stress management or self-calming techniques  as taught by your health care provider.  Do not punish yourself for setbacks or for not making progress.  Try to recognize your accomplishments, even if they are small.  Keep all follow-up visits as told by your health care provider. This is important. Contact a health care provider if:  Your symptoms do not get better.  Your symptoms get worse.  You have signs of depression, such as: ? A persistently sad, cranky, or irritable mood. ? Loss of enjoyment in activities that used to bring you joy. ? Change in weight or eating. ? Changes in sleeping habits. ? Avoiding friends or family members. ? Loss of energy for normal tasks. ? Feelings of guilt or worthlessness. Get help right away if:  You have serious thoughts about hurting yourself or others. If you ever feel like  you may hurt yourself or others, or have thoughts about taking your own life, get help right away. You can go to your nearest emergency department or call:  Your local emergency services (911 in the [place]  A suicide crisis helpline, such as the [facility] at 863-263-2007. This is open 24 hours a day.  Summary  Generalized anxiety disorder (GAD) is a mental health disorder that involves worry that is not triggered by a specific event.  People with GAD often worry excessively about many things in their lives, such as their health and family.  GAD may cause physical symptoms such as restlessness, trouble concentrating, sleep problems, frequent sweating, nausea, diarrhea, headaches, and trembling or muscle twitching.  A mental health specialist can help determine which treatment is best for you. Some people see improvement with one type of therapy. However, other people require a combination of therapies. This information is not intended to replace advice given to you by your health care provider. Make sure you discuss any questions you have with your health care provider. Document Released: 05/19/2012 Document Revised: 12/13/2015 Document Reviewed: 12/13/2015 Elsevier Interactive Patient Education  2018 [employer].   Cholesterol Cholesterol is a white, waxy, fat-like substance that is needed by the human body in small amounts. The liver makes all the cholesterol we need. Cholesterol is carried from the liver by the blood through the blood vessels. Deposits of cholesterol (plaques) may build up on blood vessel (artery) walls. Plaques make the arteries narrower and stiffer. Cholesterol plaques increase the risk for heart attack and stroke. You cannot feel your cholesterol level even if it is very high. The only way to know that it is high is to have a blood test. Once you know your cholesterol levels, you should keep a record of the test results. Work with your health  care provider to keep your levels in the desired range. What do the results mean?  Total cholesterol is a rough measure of all the cholesterol in your blood.  LDL (low-density lipoprotein) is the "bad" cholesterol. This is the type that causes plaque to build up on the artery walls. You want this level to be low.  HDL (high-density lipoprotein) is the "good" cholesterol because it cleans the arteries and carries the LDL away. You want this level to be high.  Triglycerides are fat that the body can either burn for energy or store. High levels are closely linked to heart disease. What are the desired levels of cholesterol?  Total cholesterol below 200.  LDL below 100 for people who are at risk, below 70 for people at very high risk.  HDL above 40 is good.  A level of 60 or higher is considered to be protective against heart disease.  Triglycerides below 150. How can I lower my cholesterol? Diet Follow your diet program as told by your health care provider.  Choose fish or white meat chicken and [place], roasted or baked. Limit fatty cuts of red meat, fried foods, and processed meats, such as sausage and lunch meats.  Eat lots of fresh fruits and vegetables.  Choose whole grains, beans, pasta, potatoes, and cereals.  Choose olive oil, corn oil, or canola oil, and use only small amounts.  Avoid butter, mayonnaise, shortening, or palm kernel oils.  Avoid foods with trans fats.  Drink skim or nonfat milk and eat low-fat or nonfat yogurt and cheeses. Avoid whole milk, cream, ice cream, egg yolks, and full-fat cheeses.  Healthier desserts include angel food cake, ginger snaps, animal crackers, hard candy, popsicles, and low-fat or nonfat frozen yogurt. Avoid pastries, cakes, pies, and cookies.  Exercise  Follow your exercise program as told by your health care provider. A regular program: ? Helps to decrease LDL and raise HDL. ? Helps with weight control.  Do things that increase  your activity level, such as gardening, walking, and taking the stairs.  Ask your health care provider about ways that you can be more active in your daily life.  Medicine  Take over-the-counter and prescription medicines only as told by your health care provider. ? Medicine may be prescribed by your health care provider to help lower cholesterol and decrease the risk for heart disease. This is usually done if diet and exercise have failed to bring down cholesterol levels. ? If you have several risk factors, you may need medicine even if your levels are normal.  This information is not intended to replace advice given to you by your health care provider. Make sure you discuss any questions you have with your health care provider. Document Released: 10/17/2000 Document Revised: 08/20/2015 Document Reviewed: 07/23/2015 Elsevier Interactive Patient Education  [employer].

## 2017-12-04 NOTE — Progress Notes (Signed)
Pre visit review using our clinic review tool, if applicable. No additional management support is needed unless otherwise documented below in the visit note. 

## 2017-12-04 NOTE — Progress Notes (Signed)
Chief Complaint  Patient presents with  . Follow-up   F/u  1. Anxiety and depression phq 5 improved from 13 and GAD 7 13. He has generalized anxiety about everything though he is meditating. He cant afford therapy due to cost -wellbutrin 150 mg xl qd helped with mood but caused mild h/a daily and he was having had to take ibuprofen prn. Reviewed side effects and this is 25-35% change h/a wellbutrin  2. HLD on mevacor 40 mg qd and pt reports doing keto diet with a lot of pork and eggs   Review of Systems  Constitutional: Positive for weight loss.  HENT: Negative for hearing loss.   Eyes: Negative for blurred vision.  Cardiovascular: Negative for chest pain.  Genitourinary:       Denies weak urinary stream    Skin: Negative for rash.  Psychiatric/Behavioral: Positive for depression. The patient is nervous/anxious.    Past Medical History:  Diagnosis Date  . Anxiety   . Depression   . GERD (gastroesophageal reflux disease)   . Hyperlipidemia    Past Surgical History:  Procedure Laterality Date  . vertical sleeve gastrectomy     2013 85% stomach removed was 280 lbs before surgery    Family History  Problem Relation Age of Onset  . Diabetes Father   . Heart disease Father   . Hyperlipidemia Father   . Hypertension Father   . Heart disease Brother    Social History   Socioeconomic History  . Marital status: Married    Spouse name: Not on file  . Number of children: Not on file  . Years of education: Not on file  . Highest education level: Not on file  Occupational History  . Occupation: medical coding     Comment: [employer]  . Financial resource strain: Not on file  . Food insecurity:    Worry: Not on file    Inability: Not on file  . Transportation needs:    Medical: Not on file    Non-medical: Not on file  Tobacco Use  . Smoking status: Never Smoker  . Smokeless tobacco: Never Used  Substance and Sexual Activity  . Alcohol use: Yes    Comment:  occas. 2-3 x per year   . Drug use: Not Currently  . Sexual activity: Yes    Partners: Female  Lifestyle  . Physical activity:    Days per week: Not on file    Minutes per session: Not on file  . Stress: Not on file  Relationships  . Social connections:    Talks on phone: Not on file    Gets together: Not on file    Attends religious service: Not on file    Active member of club or organization: Not on file    Attends meetings of clubs or organizations: Not on file    Relationship status: Not on file  . Intimate partner violence:    Fear of current or ex partner: Not on file    Emotionally abused: Not on file    Physically abused: Not on file    Forced sexual activity: Not on file  Other Topics Concern  . Not on file  Social History Narrative   Moved from [place]    Married    Some college    Careers information officer for [employer]   Owns work, wears seat belt, safe in relationship    Current Meds  Medication Sig  . aspirin EC 81 MG tablet  Take 81 mg by mouth daily. Taking 5 days per week  . cyclobenzaprine (FLEXERIL) 10 MG tablet Take 10 mg by mouth 3 (three) times daily as needed for muscle spasms.  Marland Kitchen esomeprazole (NEXIUM) 40 MG capsule Take 1 capsule (40 mg total) by mouth daily. 30 minutes before food  . fluticasone (FLONASE) 50 MCG/ACT nasal spray Place into both nostrils daily.  Marland Kitchen L-Arginine 500 MG CAPS Take 1,000 mg by mouth daily.   Marland Kitchen LORazepam (ATIVAN) 1 MG tablet Take 1 tablet (1 mg total) by mouth daily as needed for anxiety.  . lovastatin (MEVACOR) 40 MG tablet Take 1 tablet (40 mg total) by mouth at bedtime.  . pantoprazole (PROTONIX) 40 MG tablet Take 1 tablet (40 mg total) by mouth daily. 30 minutes before meal  . triamcinolone (NASACORT ALLERGY 24HR CHILDREN) 55 MCG/ACT AERO nasal inhaler Place 2 sprays into the nose daily.  Marland Kitchen UNABLE TO FIND 400 mg daily. Med Name: SAMe Supplement per pt for anxiety  . YOHIMBINE HCL PO Take 5 mg by mouth daily.  . [DISCONTINUED]  buPROPion (WELLBUTRIN XL) 150 MG 24 hr tablet Take 1 tablet (150 mg total) by mouth daily.  . [DISCONTINUED] LORazepam (ATIVAN) 1 MG tablet Take 1 tablet (1 mg total) by mouth daily as needed for anxiety.   No Known Allergies No results found for this or any previous visit (from the past 2160 hour(s)). Objective  Body mass index is 32.7 kg/m. Wt Readings from Last 3 Encounters:  12/04/17 202 lb 9.6 oz (91.9 kg)  06/12/17 217 lb 9.6 oz (98.7 kg)   Temp Readings from Last 3 Encounters:  12/04/17 98.1 F (36.7 C) (Oral)  06/12/17 98.5 F (36.9 C) (Oral)   BP Readings from Last 3 Encounters:  12/04/17 124/84  06/12/17 136/78   Pulse Readings from Last 3 Encounters:  12/04/17 70  06/12/17 76    Physical Exam  Constitutional: He is oriented to person, place, and time. Vital signs are normal. He appears well-developed and well-nourished. He is cooperative.  HENT:  Head: Normocephalic and atraumatic.  Mouth/Throat: Oropharynx is clear and moist and mucous membranes are normal.  Eyes: Pupils are equal, round, and reactive to light. Conjunctivae are normal.  Cardiovascular: Normal rate, regular rhythm and normal heart sounds.  Pulmonary/Chest: Effort normal and breath sounds normal.  Neurological: He is alert and oriented to person, place, and time. Gait normal.  Skin: Skin is warm, dry and intact.  Multi nevi trunk   Psychiatric: He has a normal mood and affect. His speech is normal and behavior is normal. Judgment and thought content normal. Cognition and memory are normal.  Nursing note and vitals reviewed.   Assessment   1. Anxiety and depression PHQ 9 score 5 today and GAD 7 score 13 today 2. HLD  3. HM  Plan  1. Reduce wellbutrin xl 150 mg qd to 75 IR to see if helps with h/a pt to my chart in 1-2 weeks Prn Ativan 1 mg qd prn if needed can cut in 1/2 bid prn  Declines SSRI was on celexa caused reduced libido in past  Unable to afford therapy 2/2 cost currently check with  insurance to see if they have preferred choice of therapy  Disc aromotherapy, meditation and mindfulness, L theanine supplement otc  2.  On mevacor 40 mg qd  Given cholesterol info today  rec not keto diet  rec chia/flaxseed premier protein shake   3.  Had flu shot 11/21/16 consider in  future  Tdap utd  Never smoker  Had MMR and hep B shot 2nd series with work f/u with job titers in future -may want to get records of titer in future  Declines urology dermatology for now 2/2 cost  rec D3 5000 IU qd   Provider: Dr. Olivia Mackie McLean-Scocuzza-[facility] Medicine

## 2017-12-12 ENCOUNTER — Encounter: Payer: Self-pay | Admitting: Internal Medicine

## 2017-12-20 ENCOUNTER — Other Ambulatory Visit: Payer: Self-pay | Admitting: Internal Medicine

## 2017-12-20 DIAGNOSIS — F419 Anxiety disorder, unspecified: Secondary | ICD-10-CM

## 2017-12-20 DIAGNOSIS — F329 Major depressive disorder, single episode, unspecified: Secondary | ICD-10-CM

## 2017-12-20 MED ORDER — BUPROPION HCL 75 MG PO TABS: 75.0000 mg | ORAL_TABLET | Freq: Two times a day (BID) | ORAL | 3 refills | Status: DC

## 2017-12-24 ENCOUNTER — Other Ambulatory Visit: Payer: Self-pay | Admitting: Internal Medicine

## 2017-12-24 DIAGNOSIS — F329 Major depressive disorder, single episode, unspecified: Secondary | ICD-10-CM

## 2017-12-24 DIAGNOSIS — F419 Anxiety disorder, unspecified: Secondary | ICD-10-CM

## 2017-12-24 MED ORDER — BUPROPION HCL 75 MG PO TABS: 75.0000 mg | ORAL_TABLET | Freq: Two times a day (BID) | ORAL | 3 refills | Status: DC

## 2018-01-27 ENCOUNTER — Encounter: Payer: Self-pay | Admitting: Internal Medicine

## 2018-02-27 ENCOUNTER — Encounter: Payer: Self-pay | Admitting: Internal Medicine

## 2018-02-28 ENCOUNTER — Other Ambulatory Visit: Payer: Self-pay | Admitting: Internal Medicine

## 2018-02-28 ENCOUNTER — Ambulatory Visit: Payer: Self-pay | Admitting: Internal Medicine

## 2018-02-28 DIAGNOSIS — N529 Male erectile dysfunction, unspecified: Secondary | ICD-10-CM

## 2018-03-27 ENCOUNTER — Ambulatory Visit: Payer: Self-pay | Admitting: Internal Medicine

## 2018-04-11 ENCOUNTER — Encounter: Payer: Self-pay | Admitting: Urology

## 2018-04-11 ENCOUNTER — Ambulatory Visit: Payer: 59 | Admitting: Urology

## 2018-04-11 VITALS — BP 144/79 | HR 77 | Ht 66.5 in | Wt 200.0 lb

## 2018-04-11 DIAGNOSIS — N529 Male erectile dysfunction, unspecified: Secondary | ICD-10-CM

## 2018-04-11 DIAGNOSIS — R399 Unspecified symptoms and signs involving the genitourinary system: Secondary | ICD-10-CM | POA: Diagnosis not present

## 2018-04-11 MED ORDER — TAMSULOSIN HCL 0.4 MG PO CAPS: 0.4000 mg | ORAL_CAPSULE | Freq: Every day | ORAL | 0 refills | Status: DC

## 2018-04-11 MED ORDER — SILDENAFIL CITRATE 20 MG PO TABS: ORAL_TABLET | ORAL | 0 refills | Status: DC

## 2018-04-11 NOTE — Patient Instructions (Signed)
Erectile Dysfunction  Erectile dysfunction (ED) is the inability to get or keep an erection in order to have sexual intercourse. Erectile dysfunction may include:   Inability to get an erection.   Lack of enough hardness of the erection to allow penetration.   Loss of the erection before sex is finished.  What are the causes?  This condition may be caused by:   Certain medicines, such as:  ? Pain relievers.  ? Antihistamines.  ? Antidepressants.  ? Blood pressure medicines.  ? Water pills (diuretics).  ? Ulcer medicines.  ? Muscle relaxants.  ? Drugs.   Excessive drinking.   Psychological causes, such as:  ? Anxiety.  ? Depression.  ? Sadness.  ? Exhaustion.  ? Performance fear.  ? Stress.   Physical causes, such as:  ? Artery problems. This may include diabetes, smoking, liver disease, or atherosclerosis.  ? High blood pressure.  ? Hormonal problems, such as low testosterone.  ? Obesity.  ? Nerve problems. This may include back or pelvic injuries, diabetes mellitus, multiple sclerosis, or Parkinson disease.  What are the signs or symptoms?  Symptoms of this condition include:   Inability to get an erection.   Lack of enough hardness of the erection to allow penetration.   Loss of the erection before sex is finished.   Normal erections at some times, but with frequent unsatisfactory episodes.   Low sexual satisfaction in either partner due to erection problems.   A curved penis occurring with erection. The curve may cause pain or the penis may be too curved to allow for intercourse.   Never having nighttime erections.  How is this diagnosed?  This condition is often diagnosed by:   Performing a physical exam to find other diseases or specific problems with the penis.   Asking you detailed questions about the problem.   Performing blood tests to check for diabetes mellitus or to measure hormone levels.   Performing other tests to check for underlying health conditions.   Performing an ultrasound  exam to check for scarring.   Performing a test to check blood flow to the penis.   Doing a sleep study at home to measure nighttime erections.  How is this treated?  This condition may be treated by:   Medicine taken by mouth to help you achieve an erection (oral medicine).   Hormone replacement therapy to replace low testosterone levels.   Medicine that is injected into the penis. Your health care provider may instruct you how to give yourself these injections at home.   Vacuum pump. This is a pump with a ring on it. The pump and ring are placed on the penis and used to create pressure that helps the penis become erect.   Penile implant surgery. In this procedure, you may receive:  ? An inflatable implant. This consists of cylinders, a pump, and a reservoir. The cylinders can be inflated with a fluid that helps to create an erection, and they can be deflated after intercourse.  ? A semi-rigid implant. This consists of two silicone rubber rods. The rods provide some rigidity. They are also flexible, so the penis can both curve downward in its normal position and become straight for sexual intercourse.   Blood vessel surgery, to improve blood flow to the penis. During this procedure, a blood vessel from a different part of the body is placed into the penis to allow blood to flow around (bypass) damaged or blocked blood vessels.     Lifestyle changes, such as exercising more, losing weight, and quitting smoking.  Follow these instructions at home:  Medicines     Take over-the-counter and prescription medicines only as told by your health care provider. Do not increase the dosage without first discussing it with your health care provider.   If you are using self-injections, perform injections as directed by your health care provider. Make sure to avoid any veins that are on the surface of the penis. After giving an injection, apply pressure to the injection site for 5 minutes.  General  instructions   Exercise regularly, as directed by your health care provider. Work with your health care provider to lose weight, if needed.   Do not use any products that contain nicotine or tobacco, such as cigarettes and e-cigarettes. If you need help quitting, ask your health care provider.   Before using a vacuum pump, read the instructions that come with the pump and discuss any questions with your health care provider.   Keep all follow-up visits as told by your health care provider. This is important.  Contact a health care provider if:   You feel nauseous.   You vomit.  Get help right away if:   You are taking oral or injectable medicines and you have an erection that lasts longer than 4 hours. If your health care provider is unavailable, go to the nearest emergency room for evaluation. An erection that lasts much longer than 4 hours can result in permanent damage to your penis.   You have severe pain in your groin or abdomen.   You develop redness or severe swelling of your penis.   You have redness spreading up into your groin or lower abdomen.   You are unable to urinate.   You experience chest pain or a rapid heart beat (palpitations) after taking oral medicines.  Summary   Erectile dysfunction (ED) is the inability to get or keep an erection during sexual intercourse. This problem can usually be treated successfully.   This condition is diagnosed based on a physical exam, your symptoms, and tests to determine the cause. Treatment varies depending on the cause, and may include medicines, hormone therapy, surgery, or vacuum pump.   You may need follow-up visits to make sure that you are using your medicines or devices correctly.   Get help right away if you are taking or injecting medicines and you have an erection that lasts longer than 4 hours.  This information is not intended to replace advice given to you by your health care provider. Make sure you discuss any questions you have with  your health care provider.  Document Released: 01/20/2000 Document Revised: 02/08/2016 Document Reviewed: 02/08/2016  Elsevier Interactive Patient Education  2019 [employer] Inc[employer]

## 2018-04-11 NOTE — Progress Notes (Signed)
04/11/2018 12:27 PM   Marcus Roberts 12/14/1975 [id number]  Referring provider: McLean-Scocuzza, Nino Glow, MD [place], [place] [address]  Chief Complaint  Patient presents with  . Erectile Dysfunction    HPI: 43 year old male seen in consultation at the request of Dr. Terese Door for evaluation of erectile dysfunction and lower urinary tract symptoms.  He presents with a 6 to 7-year history of intermittent lower urinary tract symptoms consisting of urinary hesitancy, intermittent urinary stream and a weak urinary stream.  This is associated with a pressure sensation in his perineum.  He denies dysuria or gross hematuria.  Denies flank, abdominal, pelvic or scrotal pain.  Denies previous treatment for these symptoms.  He also complains of difficulty achieving and maintaining an erection.  He states his current erections are 30-40% of normal.  He can usually achieve penetration however is typically unable to maintain the erection and intercourse is generally not successful.  Denies pain or curvature with erections.  Total, free and bioavailable testosterone levels in May 2019 were normal.  He has had some decreased libido.  No prior treatment for his ED other than L-arginine and yohimbine.  Organic risk factors include hypercholesterolemia.  Denies tobacco use.   PMH: Past Medical History:  Diagnosis Date  . Anxiety   . Depression   . GERD (gastroesophageal reflux disease)   . Hyperlipidemia     Surgical History: Past Surgical History:  Procedure Laterality Date  . vertical sleeve gastrectomy     2013 85% stomach removed was 280 lbs before surgery     Home Medications:  Allergies as of 04/11/2018   No Known Allergies     Medication List       Accurate as of April 11, 2018 12:27 PM. Always use your most recent med list.        aspirin EC 81 MG tablet Take 81 mg by mouth daily. Taking 5 days per week   buPROPion 75 MG tablet Commonly known as:   Wellbutrin Take 1 tablet (75 mg total) by mouth 2 (two) times daily. In am   cyclobenzaprine 10 MG tablet Commonly known as:  FLEXERIL Take 10 mg by mouth 3 (three) times daily as needed for muscle spasms.   esomeprazole 40 MG capsule Commonly known as:  NexIUM Take 1 capsule (40 mg total) by mouth daily. 30 minutes before food   fluticasone 50 MCG/ACT nasal spray Commonly known as:  FLONASE Place into both nostrils daily.   L-Arginine 500 MG Caps Take 1,000 mg by mouth daily.   LORazepam 1 MG tablet Commonly known as:  ATIVAN Take 1 tablet (1 mg total) by mouth daily as needed for anxiety.   lovastatin 40 MG tablet Commonly known as:  MEVACOR Take 1 tablet (40 mg total) by mouth at bedtime.   Nasacort Allergy 24HR Children 55 MCG/ACT Aero nasal inhaler Generic drug:  triamcinolone Place 2 sprays into the nose daily.   pantoprazole 40 MG tablet Commonly known as:  Protonix Take 1 tablet (40 mg total) by mouth daily. 30 minutes before meal   UNABLE TO FIND 400 mg daily. Med Name: SAMe Supplement per pt for anxiety   YOHIMBINE HCL PO Take 5 mg by mouth daily.       Allergies: No Known Allergies  Family History: Family History  Problem Relation Age of Onset  . Diabetes Father   . Heart disease Father   . Hyperlipidemia Father   . Hypertension Father   . Heart  disease Brother     Social History:  reports that he has never smoked. He has never used smokeless tobacco. He reports current alcohol use. He reports previous drug use.  ROS: UROLOGY Frequent Urination?: No Hard to postpone urination?: No Burning/pain with urination?: No Get up at night to urinate?: No Leakage of urine?: No Urine stream starts and stops?: Yes Trouble starting stream?: Yes Do you have to strain to urinate?: No Blood in urine?: No Urinary tract infection?: No Sexually transmitted disease?: No Injury to kidneys or bladder?: No Painful intercourse?: No Weak stream?: Yes Erection  problems?: Yes Penile pain?: No  Gastrointestinal Nausea?: No Vomiting?: No Indigestion/heartburn?: Yes Diarrhea?: No Constipation?: Yes  Constitutional Fever: No Night sweats?: No Weight loss?: No Fatigue?: No  Skin Skin rash/lesions?: No Itching?: No  Eyes Blurred vision?: No Double vision?: No  Ears/Nose/Throat Sore throat?: No Sinus problems?: Yes  Hematologic/Lymphatic Swollen glands?: No Easy bruising?: No  Cardiovascular Leg swelling?: No Chest pain?: No  Respiratory Cough?: No Shortness of breath?: No  Endocrine Excessive thirst?: No  Musculoskeletal Back pain?: No Joint pain?: No  Neurological Headaches?: No Dizziness?: No  Psychologic Depression?: Yes Anxiety?: Yes  Physical Exam: BP (!) 144/79 (BP Location: Left Arm, Patient Position: Sitting, Cuff Size: Normal)   Pulse 77   Ht 5' 6.5" (1.689 m)   Wt 200 lb (90.7 kg)   BMI 31.80 kg/m   Constitutional:  Alert and oriented, No acute distress. HEENT: [place] AT, moist mucus membranes.  Trachea midline, no masses. Cardiovascular: No clubbing, cyanosis, or edema. Respiratory: Normal respiratory effort, no increased work of breathing. GI: Abdomen is soft, nontender, nondistended, no abdominal masses GU: No CVA tenderness.  Penis subcoronal hypospadias.  Testes descended bilaterally without masses or tenderness cord/epididymis palpably normal bilaterally.  Prostate 30 g, smooth without tenderness.  No pelvic floor tenderness noted. Skin: No rashes, bruises or suspicious lesions. Neurologic: Grossly intact, no focal deficits, moving all 4 extremities. Psychiatric: Normal mood and affect.  Laboratory Data:  Lab Results  Component Value Date   PSA 0.78 06/13/2017    Lab Results  Component Value Date   TESTOSTERONE 511 06/13/2017    Assessment & Plan:   43 year old male with lower urinary tract symptoms and ED.  He states his symptoms are bothersome enough that he desires a trial of  medication.  When initially give a trial of tamsulosin 0.4 mg daily.  For his ED Rx generic sildenafil was sent to his pharmacy.  Return in about 6 weeks (around 05/23/2018) for Recheck 4-6 weeks.   Abbie Sons, [place] [address] [place],  [address] [phone]

## 2018-04-14 ENCOUNTER — Encounter: Payer: Self-pay | Admitting: Urology

## 2018-04-16 ENCOUNTER — Other Ambulatory Visit: Payer: Self-pay | Admitting: Family Medicine

## 2018-04-16 ENCOUNTER — Encounter: Payer: Self-pay | Admitting: Urology

## 2018-04-16 MED ORDER — SILDENAFIL CITRATE 20 MG PO TABS: ORAL_TABLET | ORAL | 0 refills | Status: DC

## 2018-04-20 ENCOUNTER — Other Ambulatory Visit: Payer: Self-pay | Admitting: Urology

## 2018-04-20 MED ORDER — ALFUZOSIN HCL ER 10 MG PO TB24: 10.0000 mg | ORAL_TABLET | Freq: Every day | ORAL | 0 refills | Status: DC

## 2018-05-05 ENCOUNTER — Telehealth: Payer: Self-pay | Admitting: Family Medicine

## 2018-05-05 ENCOUNTER — Encounter: Payer: Self-pay | Admitting: Urology

## 2018-05-05 NOTE — Telephone Encounter (Signed)
Please call and reschedule appointment.  Hello, Can this visit be rescheduled from Thursday May 15, 2018 to sometime in May please?    Marcus Roberts

## 2018-05-05 NOTE — Telephone Encounter (Signed)
App has been rescheduled to 06-26-18 @ 8:15 I have sent the patient a my chart message   Sharyn Lull

## 2018-05-15 ENCOUNTER — Ambulatory Visit: Payer: 59 | Admitting: Urology

## 2018-05-15 ENCOUNTER — Other Ambulatory Visit: Payer: Self-pay | Admitting: *Deleted

## 2018-05-15 MED ORDER — TAMSULOSIN HCL 0.4 MG PO CAPS: 0.4000 mg | ORAL_CAPSULE | Freq: Every day | ORAL | 0 refills | Status: DC

## 2018-05-19 ENCOUNTER — Telehealth: Payer: Self-pay

## 2018-05-19 MED ORDER — ALFUZOSIN HCL ER 10 MG PO TB24: 10.0000 mg | ORAL_TABLET | Freq: Every day | ORAL | 0 refills | Status: DC

## 2018-05-19 NOTE — Telephone Encounter (Signed)
Refill for Alfuzosin sent to [facility]

## 2018-06-05 ENCOUNTER — Ambulatory Visit: Payer: Self-pay | Admitting: Internal Medicine

## 2018-06-09 ENCOUNTER — Other Ambulatory Visit: Payer: Self-pay

## 2018-06-09 MED ORDER — TAMSULOSIN HCL 0.4 MG PO CAPS: 0.4000 mg | ORAL_CAPSULE | Freq: Every day | ORAL | 0 refills | Status: DC

## 2018-06-24 ENCOUNTER — Telehealth: Payer: Self-pay | Admitting: Internal Medicine

## 2018-06-24 ENCOUNTER — Other Ambulatory Visit: Payer: Self-pay | Admitting: Internal Medicine

## 2018-06-24 DIAGNOSIS — F419 Anxiety disorder, unspecified: Secondary | ICD-10-CM

## 2018-06-24 DIAGNOSIS — K219 Gastro-esophageal reflux disease without esophagitis: Secondary | ICD-10-CM

## 2018-06-24 MED ORDER — ESOMEPRAZOLE MAGNESIUM 40 MG PO CPDR: 40.0000 mg | DELAYED_RELEASE_CAPSULE | Freq: Every day | ORAL | 1 refills | Status: DC

## 2018-06-24 MED ORDER — LORAZEPAM 1 MG PO TABS: 1.0000 mg | ORAL_TABLET | Freq: Every day | ORAL | 2 refills | Status: DC | PRN

## 2018-06-24 NOTE — Telephone Encounter (Signed)
Call and schedule f/u yearly by 12/05/18   Thanks [place]

## 2018-06-26 ENCOUNTER — Telehealth (INDEPENDENT_AMBULATORY_CARE_PROVIDER_SITE_OTHER): Payer: 59 | Admitting: Urology

## 2018-06-26 ENCOUNTER — Other Ambulatory Visit: Payer: Self-pay

## 2018-06-26 DIAGNOSIS — R399 Unspecified symptoms and signs involving the genitourinary system: Secondary | ICD-10-CM

## 2018-06-26 DIAGNOSIS — N529 Male erectile dysfunction, unspecified: Secondary | ICD-10-CM | POA: Insufficient documentation

## 2018-06-26 MED ORDER — TAMSULOSIN HCL 0.4 MG PO CAPS: 0.4000 mg | ORAL_CAPSULE | Freq: Every day | ORAL | 3 refills | Status: DC

## 2018-06-26 MED ORDER — SILDENAFIL CITRATE 20 MG PO TABS: ORAL_TABLET | ORAL | 3 refills | Status: DC

## 2018-06-26 NOTE — Progress Notes (Signed)
Virtual Visit via Video Note  I connected with Marcus Roberts on 06/26/18 at 10:00 AM EDT by a video enabled telemedicine application and verified that I am speaking with the correct person using two identifiers.  Location: Patient: Home Provider::   I discussed the limitations of evaluation and management by telemedicine and the availability of in person appointments. The patient expressed understanding and agreed to proceed.  History of Present Illness: 43 year old male presents for follow-up of lower urinary tract symptoms and erectile dysfunction via video visit secondary to COVID-19 pandemic.  He was initially seen on 04/11/2018 with a 6-7-year history of intermittent obstructive voiding symptoms.  He also complained of difficulty achieving and maintaining an erection.  He was initially started on alfuzosin which with mild improvement in his voiding symptoms.  He was subsequently switched to tamsulosin which he states has worked well.  He is currently satisfied with his voiding pattern.  He was also started on sildenafil and is taking 20 mg prior to intercourse which he states has been effective.   Observations/Objective: Alert, in no acute distress  Assessment and Plan: 43 year old male with lower urinary tract symptoms and erectile dysfunction doing well on alpha-blocker and PDE 5 therapy respectively.  Tamsulosin and sildenafil were refilled.  Follow Up Instructions: Annual follow-up   I discussed the assessment and treatment plan with the patient. The patient was provided an opportunity to ask questions and all were answered. The patient agreed with the plan and demonstrated an understanding of the instructions.   The patient was advised to call back or seek an in-person evaluation if the symptoms worsen or if the condition fails to improve as anticipated.  I provided 8 minutes of non-face-to-face time during this encounter.   Abbie Sons, MD

## 2018-07-09 ENCOUNTER — Other Ambulatory Visit: Payer: Self-pay | Admitting: Internal Medicine

## 2018-07-09 DIAGNOSIS — K219 Gastro-esophageal reflux disease without esophagitis: Secondary | ICD-10-CM

## 2018-10-03 ENCOUNTER — Other Ambulatory Visit: Payer: Self-pay | Admitting: Urology

## 2018-10-03 ENCOUNTER — Encounter: Payer: Self-pay | Admitting: Urology

## 2018-10-03 MED ORDER — SILODOSIN 8 MG PO CAPS: 8.0000 mg | ORAL_CAPSULE | Freq: Every day | ORAL | 1 refills | Status: DC

## 2018-10-28 ENCOUNTER — Other Ambulatory Visit: Payer: Self-pay

## 2018-10-28 DIAGNOSIS — R399 Unspecified symptoms and signs involving the genitourinary system: Secondary | ICD-10-CM

## 2018-10-28 MED ORDER — SILODOSIN 8 MG PO CAPS: 8.0000 mg | ORAL_CAPSULE | Freq: Every day | ORAL | 11 refills | Status: DC

## 2018-12-05 ENCOUNTER — Ambulatory Visit: Payer: PRIVATE HEALTH INSURANCE | Admitting: Internal Medicine

## 2018-12-08 ENCOUNTER — Other Ambulatory Visit: Payer: Self-pay

## 2018-12-08 ENCOUNTER — Emergency Department: Payer: 59

## 2018-12-08 ENCOUNTER — Emergency Department
Admission: EM | Admit: 2018-12-08 | Discharge: 2018-12-08 | Disposition: A | Discharge status: Home / Self Care | Payer: 59 | Attending: Emergency Medicine | Admitting: Emergency Medicine

## 2018-12-08 ENCOUNTER — Encounter: Payer: Self-pay | Admitting: Internal Medicine

## 2018-12-08 ENCOUNTER — Encounter: Payer: Self-pay | Admitting: Emergency Medicine

## 2018-12-08 DIAGNOSIS — R0789 Other chest pain: Secondary | ICD-10-CM

## 2018-12-08 DIAGNOSIS — R0602 Shortness of breath: Secondary | ICD-10-CM | POA: Insufficient documentation

## 2018-12-08 DIAGNOSIS — Z7982 Long term (current) use of aspirin: Secondary | ICD-10-CM | POA: Insufficient documentation

## 2018-12-08 DIAGNOSIS — R079 Chest pain, unspecified: Secondary | ICD-10-CM | POA: Diagnosis present

## 2018-12-08 DIAGNOSIS — Z79899 Other long term (current) drug therapy: Secondary | ICD-10-CM | POA: Insufficient documentation

## 2018-12-08 LAB — COMPREHENSIVE METABOLIC PANEL
ALT: 20 U/L (ref 0–44)
AST: 20 U/L (ref 15–41)
Albumin: 4.3 g/dL (ref 3.5–5.0)
Alkaline Phosphatase: 56 U/L (ref 38–126)
Anion gap: 11 (ref 5–15)
BUN: 12 mg/dL (ref 6–20)
CO2: 27 mmol/L (ref 22–32)
Calcium: 9.1 mg/dL (ref 8.9–10.3)
Chloride: 103 mmol/L (ref 98–111)
Creatinine, Ser: 1 mg/dL (ref 0.61–1.24)
GFR calc Af Amer: >60 mL/min (ref >60)
GFR calc non Af Amer: >60 mL/min (ref >60)
Glucose, Bld: 120 mg/dL — ABNORMAL HIGH (ref 70–99)
Potassium: 3.9 mmol/L (ref 3.5–5.1)
Sodium: 141 mmol/L (ref 135–145)
Total Bilirubin: 0.9 mg/dL (ref 0.3–1.2)
Total Protein: 7.2 g/dL (ref 6.5–8.1)

## 2018-12-08 LAB — CBC
HCT: 43.7 % (ref 39.0–52.0)
Hemoglobin: 15.1 g/dL (ref 13.0–17.0)
MCH: 30.8 pg (ref 26.0–34.0)
MCHC: 34.6 g/dL (ref 30.0–36.0)
MCV: 89 fL (ref 80.0–100.0)
Platelets: 200 10*3/uL (ref 150–400)
RBC: 4.91 MIL/uL (ref 4.22–5.81)
RDW: 12.1 % (ref 11.5–15.5)
WBC: 6.6 10*3/uL (ref 4.0–10.5)
nRBC: 0 % (ref 0.0–0.2)

## 2018-12-08 LAB — TROPONIN I (HIGH SENSITIVITY)
Troponin I (High Sensitivity): 36 ng/L — ABNORMAL HIGH (ref <18)
Troponin I (High Sensitivity): 43 ng/L — ABNORMAL HIGH (ref <18)
Troponin I (High Sensitivity): 45 ng/L — ABNORMAL HIGH (ref <18)

## 2018-12-08 MED ORDER — SODIUM CHLORIDE 0.9% FLUSH: 3.0000 mL | Freq: Once | INTRAVENOUS | Status: DC

## 2018-12-08 MED ORDER — ASPIRIN 81 MG PO CHEW
324.0000 mg | CHEWABLE_TABLET | Freq: Once | ORAL | Status: AC
  Administered 2018-12-08: 324 mg via ORAL
  Filled 2018-12-08: qty 4

## 2018-12-08 NOTE — ED Triage Notes (Signed)
Pt reports pain to the center of his chest intermittently for the past 2-3 weeks; tonight the pain woke him from sleep and this was concerning; pt reports some shortness of breath, some nausea, no vomiting and some diaphoresis when he woke; pt awake and alert; skin warm and dry; talking in complete coherent sentences

## 2018-12-08 NOTE — ED Provider Notes (Signed)
[facility] Emergency Department Provider Note  ____________________________________________  Time seen: Approximately 9:04 AM  I have reviewed the triage vital signs and the nursing notes.   HISTORY  Chief Complaint Chest Pain and Shortness of Breath    HPI Marcus Roberts is a 43 y.o. male with a history of GERD, anxiety, hyperlipidemia who comes the ED complaining of intermittent central chest pain radiating to the throat, described as burning, occurring for the past 3 weeks, seems to be worse at night.  Woke him up from sleep this evening at about 2:30 AM.  Resolved by the time he arrived in the emergency department.   Not exertional.  Not pleuritic.  No fevers or chills or cough.  Denies vomiting or diaphoresis.  No shortness of breath, palpitations or dizziness.  Has a history of GERD.  Takes esomeprazole every other day.     Past Medical History:  Diagnosis Date  . Anxiety   . Depression   . GERD (gastroesophageal reflux disease)   . Hyperlipidemia      Patient Active Problem List   Diagnosis Date Noted  . Lower urinary tract symptoms 06/26/2018  . Erectile dysfunction 06/26/2018  . GERD (gastroesophageal reflux disease) 06/12/2017  . HLD (hyperlipidemia) 06/12/2017  . Anxiety and depression 06/12/2017  . Obesity (BMI 30.0-34.9) 06/12/2017  . Arthralgia 06/12/2017  . Reduced libido 06/12/2017  . History of kidney stones 06/12/2017  . Multiple nevi 06/12/2017     Past Surgical History:  Procedure Laterality Date  . vertical sleeve gastrectomy     2013 85% stomach removed was 280 lbs before surgery      Prior to Admission medications   Medication Sig Start Date End Date Taking? Authorizing Provider  aspirin EC 81 MG tablet Take 81 mg by mouth daily. Taking 5 days per week   Yes [provider]  cyclobenzaprine (FLEXERIL) 10 MG tablet Take 10 mg by mouth 3 (three) times daily as needed for muscle spasms.   Yes [provider]  esomeprazole (NEXIUM) 40 MG capsule Take 1 capsule (40 mg total) by mouth daily. 30 minutes before food 06/24/18  Yes McLean-Scocuzza, Nino Glow, MD  fluticasone (FLONASE) 50 MCG/ACT nasal spray Place into both nostrils daily.   Yes [provider]  L-Arginine 500 MG CAPS Take 1,000 mg by mouth daily.    Yes [provider]  LORazepam (ATIVAN) 1 MG tablet Take 1 tablet (1 mg total) by mouth daily as needed for anxiety. 06/24/18  Yes McLean-Scocuzza, Nino Glow, MD     Allergies Patient has no known allergies.   Family History  Problem Relation Age of Onset  . Diabetes Father   . Heart disease Father   . Hyperlipidemia Father   . Hypertension Father   . Heart disease Brother   Positive early CAD in brother and father.  Social History Social History   Tobacco Use  . Smoking status: Never Smoker  . Smokeless tobacco: Never Used  Substance Use Topics  . Alcohol use: Yes    Comment: occas. 2-3 x per year   . Drug use: Never    Review of Systems  Constitutional:   No fever or chills.  ENT:   No sore throat. No rhinorrhea. Cardiovascular:   Positive chest pain as above without syncope. Respiratory:   No dyspnea or cough. Gastrointestinal:   Negative for abdominal pain, vomiting and diarrhea.  Musculoskeletal:   Negative for focal pain or swelling All other systems reviewed  and are negative except as documented above in ROS and HPI.  ____________________________________________   PHYSICAL EXAM:  VITAL SIGNS: ED Triage Vitals  Enc Vitals Group     BP 12/08/18 0332 (!) 151/85     Pulse Rate 12/08/18 0332 70     Resp 12/08/18 0850 14     Temp 12/08/18 0332 98.8 F (37.1 C)     Temp Source 12/08/18 0332 Oral     SpO2 12/08/18 0332 100 %     Weight 12/08/18 0335 200 lb (90.7 kg)     Height 12/08/18 0335 5\' 6"  (1.676 m)     Head Circumference --      Peak Flow --      Pain Score 12/08/18 0334 4     Pain Loc --      Pain Edu? --      Excl.  in [place]? --     Vital signs reviewed, nursing assessments reviewed.   Constitutional:   Alert and oriented. Non-toxic appearance. Eyes:   Conjunctivae are normal. EOMI. PERRL. ENT      Head:   Normocephalic and atraumatic.      Nose:   Wearing a mask.      Mouth/Throat:   Wearing a mask.      Neck:   No meningismus. Full ROM. Hematological/Lymphatic/Immunilogical:   No cervical lymphadenopathy. Cardiovascular:   RRR. Symmetric bilateral radial and DP pulses.  No murmurs. Cap refill less than 2 seconds. Respiratory:   Normal respiratory effort without tachypnea/retractions. Breath sounds are clear and equal bilaterally. No wheezes/rales/rhonchi.  No inducible wheezing or cough with FEV1 maneuver Gastrointestinal:   Soft and nontender. Non distended. There is no CVA tenderness.  No rebound, rigidity, or guarding.  Musculoskeletal:   Normal range of motion in all extremities. No joint effusions.  No lower extremity tenderness.  No edema. Neurologic:   Normal speech and language.  Motor grossly intact. No acute focal neurologic deficits are appreciated.  Skin:    Skin is warm, dry and intact. No rash noted.  No petechiae, purpura, or bullae.  ____________________________________________    LABS (pertinent positives/negatives) (all labs ordered are listed, but only abnormal results are displayed) Labs Reviewed  COMPREHENSIVE METABOLIC PANEL - Abnormal; Notable for the following components:      Result Value   Glucose, Bld 120 (*)    All other components within normal limits  TROPONIN I (HIGH SENSITIVITY) - Abnormal; Notable for the following components:   Troponin I (High Sensitivity) 36 (*)    All other components within normal limits  TROPONIN I (HIGH SENSITIVITY) - Abnormal; Notable for the following components:   Troponin I (High Sensitivity) 43 (*)    All other components within normal limits  TROPONIN I (HIGH SENSITIVITY) - Abnormal; Notable for the following components:    Troponin I (High Sensitivity) 45 (*)    All other components within normal limits  CBC   ____________________________________________   EKG  Interpreted by me  Date: 12/08/2018  Rate: 75  Rhythm: normal sinus rhythm  QRS Axis: normal  Intervals: normal  ST/T Wave abnormalities: normal  Conduction Disutrbances: none  Narrative Interpretation: unremarkable      ____________________________________________    RADIOLOGY  Dg Chest 2 View  Result Date: 12/08/2018 CLINICAL DATA:  Chest pain EXAM: CHEST - 2 VIEW COMPARISON:  None. FINDINGS: The heart size and mediastinal contours are within normal limits. Both lungs are clear. The visualized skeletal structures are unremarkable. IMPRESSION: No active cardiopulmonary  disease. Electronically Signed   By: Constance Holster M.D.   On: 12/08/2018 04:09    ____________________________________________   PROCEDURES Procedures  ____________________________________________  DIFFERENTIAL DIAGNOSIS   GERD, non-STEMI  CLINICAL IMPRESSION / ASSESSMENT AND PLAN / ED COURSE  Medications ordered in the ED: Medications  sodium chloride flush (NS) 0.9 % injection 3 mL (has no administration in time range)  aspirin chewable tablet 324 mg (324 mg Oral Given 12/08/18 0851)    Pertinent labs & imaging results that were available during my care of the patient were reviewed by me and considered in my medical decision making (see chart for details).  Nickalis Warrell Common was evaluated in Emergency Department on 12/08/2018 for the symptoms described in the history of present illness. He was evaluated in the context of the global COVID-19 pandemic, which necessitated consideration that the patient might be at risk for infection with the SARS-CoV-2 virus that causes COVID-19. Institutional protocols and algorithms that pertain to the evaluation of patients at risk for COVID-19 are in a state of rapid change based on information released by regulatory bodies  including the CDC and federal and state organizations. These policies and algorithms were followed during the patient's care in the ED.   Patient presents with atypical chest pain symptoms.Considering the patient's symptoms, medical history, and physical examination today, I have low suspicion for ACS, PE, TAD, pneumothorax, carditis, mediastinitis, pneumonia, CHF, or sepsis.  Initial EKG, chest x-ray, and labs are all reassuring.  Due to positive family history, will check a second troponin for interval change, plan for outpatient follow-up with primary care if further reassuring.  Recommended he increase his Nexium to once daily for the next week and take Tums as needed to help with the symptoms.   ----------------------------------------- 10:09 AM on 12/08/2018 -----------------------------------------  Repeat troponin stable in the ED.  Not consistent with non-STEMI or other ACS event.  Stable for discharge home and primary care follow-up.     ____________________________________________   FINAL CLINICAL IMPRESSION(S) / ED DIAGNOSES    Final diagnoses:  Atypical chest pain     ED Discharge Orders    None      Portions of this note were generated with dragon dictation software. Dictation errors may occur despite best attempts at proofreading.   Carrie Mew, MD 12/08/18 1010

## 2018-12-10 ENCOUNTER — Other Ambulatory Visit: Payer: Self-pay

## 2018-12-11 ENCOUNTER — Ambulatory Visit: Payer: 59 | Admitting: Internal Medicine

## 2018-12-11 ENCOUNTER — Other Ambulatory Visit: Payer: Self-pay

## 2018-12-11 ENCOUNTER — Encounter: Payer: Self-pay | Admitting: Internal Medicine

## 2018-12-11 VITALS — BP 122/60 | HR 84 | Temp 98.2°F | Ht 66.0 in | Wt 213.0 lb

## 2018-12-11 DIAGNOSIS — R778 Other specified abnormalities of plasma proteins: Secondary | ICD-10-CM

## 2018-12-11 DIAGNOSIS — Z1329 Encounter for screening for other suspected endocrine disorder: Secondary | ICD-10-CM

## 2018-12-11 DIAGNOSIS — R739 Hyperglycemia, unspecified: Secondary | ICD-10-CM

## 2018-12-11 DIAGNOSIS — R079 Chest pain, unspecified: Secondary | ICD-10-CM | POA: Diagnosis not present

## 2018-12-11 DIAGNOSIS — R7989 Other specified abnormal findings of blood chemistry: Secondary | ICD-10-CM

## 2018-12-11 DIAGNOSIS — Z1389 Encounter for screening for other disorder: Secondary | ICD-10-CM

## 2018-12-11 DIAGNOSIS — E559 Vitamin D deficiency, unspecified: Secondary | ICD-10-CM

## 2018-12-11 DIAGNOSIS — E785 Hyperlipidemia, unspecified: Secondary | ICD-10-CM | POA: Diagnosis not present

## 2018-12-11 HISTORY — DX: Other specified abnormal findings of blood chemistry: R79.89

## 2018-12-11 LAB — D-DIMER, QUANTITATIVE: D-Dimer, Quant: <0.19 mcg/mL FEU (ref <0.50)

## 2018-12-11 LAB — TROPONIN I (HIGH SENSITIVITY): High Sens Troponin I: 12 ng/L (ref 2–17)

## 2018-12-11 NOTE — Patient Instructions (Addendum)
Dr. Lorin [facility] cardiology  Debrox ear wax drops x 4-7 days 1x per month    Nonspecific Chest Pain, Adult Chest pain can be caused by many different conditions. It can be caused by a condition that is life-threatening and requires treatment right away. It can also be caused by something that is not life-threatening. If you have chest pain, it can be hard to know the difference, so it is important to get help right away to make sure that you do not have a serious condition. Some life-threatening causes of chest pain include:  Heart attack.  A tear in the body's main blood vessel (aortic dissection).  Inflammation around your heart (pericarditis).  A problem in the lungs, such as a blood clot (pulmonary embolism) or a collapsed lung (pneumothorax). Some non life-threatening causes of chest pain include:  Heartburn.  Anxiety or stress.  Damage to the bones, muscles, and cartilage that make up your chest wall.  Pneumonia or bronchitis.  Shingles infection (varicella-zoster virus). Chest pain can feel like:  Pain or discomfort on the surface of your chest or deep in your chest.  Crushing, pressure, aching, or squeezing pain.  Burning or tingling.  Dull or sharp pain that is worse when you move, cough, or take a deep breath.  Pain or discomfort that is also felt in your back, neck, jaw, shoulder, or arm, or pain that spreads to any of these areas. Your chest pain may come and go. It may also be constant. Your health care provider will do lab tests and other studies to find the cause of your pain. Treatment will depend on the cause of your chest pain. Follow these instructions at home: Medicines  Take over-the-counter and prescription medicines only as told by your health care provider.  If you were prescribed an antibiotic, take it as told by your health care provider. Do not stop taking the antibiotic even if you start to feel better. Lifestyle   Rest as directed by  your health care provider.  Do not use any products that contain nicotine or tobacco, such as cigarettes and e-cigarettes. If you need help quitting, ask your health care provider.  Do not drink alcohol.  Make healthy lifestyle choices as recommended. These may include: ? Getting regular exercise. Ask your health care provider to suggest some activities that are safe for you. ? Eating a heart-healthy diet. This includes plenty of fresh fruits and vegetables, whole grains, low-fat (lean) protein, and low-fat dairy products. A dietitian can help you find healthy eating options. ? Maintaining a healthy weight. ? Managing any other health conditions you have, such as high blood pressure (hypertension) or diabetes. ? Reducing stress, such as with yoga or relaxation techniques. General instructions  Pay attention to any changes in your symptoms. Tell your health care provider about them or any new symptoms.  Avoid any activities that cause chest pain.  Keep all follow-up visits as told by your health care provider. This is important. This includes visits for any further testing if your chest pain does not go away. Contact a health care provider if:  Your chest pain does not go away.  You feel depressed.  You have a fever. Get help right away if:  Your chest pain gets worse.  You have a cough that gets worse, or you cough up blood.  You have severe pain in your abdomen.  You faint.  You have sudden, unexplained chest discomfort.  You have sudden, unexplained discomfort in your arms,  back, neck, or jaw.  You have shortness of breath at any time.  You suddenly start to sweat, or your skin gets clammy.  You feel nausea or you vomit.  You suddenly feel lightheaded or dizzy.  You have severe weakness, or unexplained weakness or fatigue.  Your heart begins to beat quickly, or it feels like it is skipping beats. These symptoms may represent a serious problem that is an emergency.  Do not wait to see if the symptoms will go away. Get medical help right away. Call your local emergency services (911 in the [place] Do not drive yourself to the hospital. Summary  Chest pain can be caused by a condition that is serious and requires urgent treatment. It may also be caused by something that is not life-threatening.  If you have chest pain, it is very important to see your health care provider. Your health care provider may do lab tests and other studies to find the cause of your pain.  Follow your health care provider's instructions on taking medicines, making lifestyle changes, and getting emergency treatment if symptoms become worse.  Keep all follow-up visits as told by your health care provider. This includes visits for any further testing if your chest pain does not go away. This information is not intended to replace advice given to you by your health care provider. Make sure you discuss any questions you have with your health care provider. Document Released: 11/01/2004 Document Revised: 07/25/2017 Document Reviewed: 07/25/2017 Elsevier Patient Education  2020 [employer].

## 2018-12-11 NOTE — Progress Notes (Signed)
Chief Complaint  Patient presents with  . Follow-up   F/u  1. Chest pain ED 12/08/2018 he does have GERD but this is different 12/08/2018 6-7/10 woke out of sleep and 12/10/2018 4-5/10. CP with exertion and rest and FH MI x 2 dad and brother around age 34s. No chest pain today He also had left leg pain recently. He was given aspirin in the ED 325 mg x 1 and has tried tums (unclear if helped) and nexium w/o relief  He is concerned. Also CP lasted 10-15 minutes squeezing sensation, burning associated with SOB, erratic pulse from 40s-100 and nausea.  2. HLD and FH heart disease in brother and dad   Review of Systems  Constitutional: Negative for weight loss.  HENT: Negative for hearing loss.   Respiratory: Positive for shortness of breath.   Cardiovascular: Positive for chest pain and palpitations.  Gastrointestinal: Negative for heartburn.  Skin: Negative for rash.   Past Medical History:  Diagnosis Date  . Anxiety   . Depression   . GERD (gastroesophageal reflux disease)   . Hyperlipidemia    Past Surgical History:  Procedure Laterality Date  . vertical sleeve gastrectomy     2013 85% stomach removed was 280 lbs before surgery    Family History  Problem Relation Age of Onset  . Diabetes Father   . Heart disease Father        x2  . Hyperlipidemia Father   . Hypertension Father   . Heart disease Brother        MI in 47s   Social History   Socioeconomic History  . Marital status: Married    Spouse name: Not on file  . Number of children: Not on file  . Years of education: Not on file  . Highest education level: Not on file  Occupational History  . Occupation: medical coding     Comment: [employer]  . Financial resource strain: Not on file  . Food insecurity    Worry: Not on file    Inability: Not on file  . Transportation needs    Medical: Not on file    Non-medical: Not on file  Tobacco Use  . Smoking status: Never Smoker  . Smokeless tobacco: Never  Used  Substance and Sexual Activity  . Alcohol use: Yes    Comment: occas. 2-3 x per year   . Drug use: Never  . Sexual activity: Yes    Partners: Female  Lifestyle  . Physical activity    Days per week: Not on file    Minutes per session: Not on file  . Stress: Not on file  Relationships  . Social Herbalist on phone: Not on file    Gets together: Not on file    Attends religious service: Not on file    Active member of club or organization: Not on file    Attends meetings of clubs or organizations: Not on file    Relationship status: Not on file  . Intimate partner violence    Fear of current or ex partner: Not on file    Emotionally abused: Not on file    Physically abused: Not on file    Forced sexual activity: Not on file  Other Topics Concern  . Not on file  Social History Narrative   Moved from [place]    Married    Some college    Careers information officer for [employer]   Owns work, wears  seat belt, safe in relationship    Current Meds  Medication Sig  . aspirin EC 81 MG tablet Take 81 mg by mouth daily. Taking 5 days per week  . cyclobenzaprine (FLEXERIL) 10 MG tablet Take 10 mg by mouth 3 (three) times daily as needed for muscle spasms.  Marland Kitchen esomeprazole (NEXIUM) 40 MG capsule Take 1 capsule (40 mg total) by mouth daily. 30 minutes before food  . fluticasone (FLONASE) 50 MCG/ACT nasal spray Place into both nostrils daily.  Marland Kitchen L-Arginine 500 MG CAPS Take 1,000 mg by mouth daily.   Marland Kitchen LORazepam (ATIVAN) 1 MG tablet Take 1 tablet (1 mg total) by mouth daily as needed for anxiety.   No Known Allergies Recent Results (from the past 2160 hour(s))  CBC     Status: None   Collection Time: 12/08/18  3:36 AM  Result Value Ref Range   WBC 6.6 4.0 - 10.5 K/uL   RBC 4.91 4.22 - 5.81 MIL/uL   Hemoglobin 15.1 13.0 - 17.0 g/dL   HCT 43.7 39.0 - 52.0 %   MCV 89.0 80.0 - 100.0 fL   MCH 30.8 26.0 - 34.0 pg   MCHC 34.6 30.0 - 36.0 g/dL   RDW 12.1 11.5 - 15.5 %   Platelets 200  150 - 400 K/uL   nRBC 0.0 0.0 - 0.2 %    Comment: Performed at [facility], [address], [place], [place] [address]  Troponin I (High Sensitivity)     Status: Abnormal   Collection Time: 12/08/18  3:36 AM  Result Value Ref Range   Troponin I (High Sensitivity) 36 (H) <18 ng/L    Comment: (NOTE) Elevated high sensitivity troponin I (hsTnI) values and significant  changes across serial measurements may suggest ACS but many other  chronic and acute conditions are known to elevate hsTnI results.  Refer to the "Links" section for chest pain algorithms and additional  guidance. Performed at [facility], [address] [place], [place] [address]   Comprehensive metabolic panel     Status: Abnormal   Collection Time: 12/08/18  3:36 AM  Result Value Ref Range   Sodium 141 135 - 145 mmol/L   Potassium 3.9 3.5 - 5.1 mmol/L   Chloride 103 98 - 111 mmol/L   CO2 27 22 - 32 mmol/L   Glucose, Bld 120 (H) 70 - 99 mg/dL   BUN 12 6 - 20 mg/dL   Creatinine, Ser 1.00 0.61 - 1.24 mg/dL   Calcium 9.1 8.9 - 10.3 mg/dL   Total Protein 7.2 6.5 - 8.1 g/dL   Albumin 4.3 3.5 - 5.0 g/dL   AST 20 15 - 41 U/L   ALT 20 0 - 44 U/L   Alkaline Phosphatase 56 38 - 126 U/L   Total Bilirubin 0.9 0.3 - 1.2 mg/dL   GFR calc non Af Amer >60 >60 mL/min   GFR calc Af Amer >60 >60 mL/min   Anion gap 11 5 - 15    Comment: Performed at [facility], [address], [place],  [address]  Troponin I (High Sensitivity)     Status: Abnormal   Collection Time: 12/08/18  5:44 AM  Result Value Ref Range   Troponin I (High Sensitivity) 43 (H) <18 ng/L    Comment: (NOTE) Elevated high sensitivity troponin I (hsTnI) values and significant  changes across serial measurements may suggest ACS but many other  chronic and acute conditions are known to elevate hsTnI results.  Refer to the "Links" section for chest pain algorithms and additional  guidance. Performed at [facility],  [place], [place] [address]   Troponin I (High Sensitivity)     Status: Abnormal   Collection Time: 12/08/18  8:49 AM  Result Value Ref Range   Troponin I (High Sensitivity) 45 (H) <18 ng/L    Comment: (NOTE) Elevated high sensitivity troponin I (hsTnI) values and significant  changes across serial measurements may suggest ACS but many other  chronic and acute conditions are known to elevate hsTnI results.  Refer to the "Links" section for chest pain algorithms and additional  guidance. Performed at [facility], [address] [place], [place] [address]    Objective  Body mass index is 34.38 kg/m. Wt Readings from Last 3 Encounters:  12/11/18 213 lb (96.6 kg)  12/08/18 200 lb (90.7 kg)  04/11/18 200 lb (90.7 kg)   Temp Readings from Last 3 Encounters:  12/11/18 98.2 F (36.8 C) (Oral)  12/08/18 98.8 F (37.1 C) (Oral)  12/04/17 98.1 F (36.7 C) (Oral)   BP Readings from Last 3 Encounters:  12/11/18 122/60  12/08/18 130/84  04/11/18 (!) 144/79   Pulse Readings from Last 3 Encounters:  12/11/18 84  12/08/18 (!) 58  04/11/18 77    Physical Exam Vitals signs and nursing note reviewed.  Constitutional:      Appearance: Normal appearance. He is well-developed and well-groomed. He is obese.     Comments: +mask on    HENT:     Head: Normocephalic and atraumatic.  Eyes:     Conjunctiva/sclera: Conjunctivae normal.     Pupils: Pupils are equal, round, and reactive to light.  Cardiovascular:     Rate and Rhythm: Normal rate and regular rhythm.     Heart sounds: Normal heart sounds. No murmur.     Comments: CP not reproducible   Pulmonary:     Effort: Pulmonary effort is normal.     Breath sounds: Normal breath sounds.  Skin:    General: Skin is warm and dry.  Neurological:     General: No focal deficit present.     Mental Status: He is alert and oriented to person, place, and time. Mental status is at baseline.     Gait: Gait  normal.  Psychiatric:        Attention and Perception: Attention and perception normal.        Mood and Affect: Mood and affect normal.        Speech: Speech normal.        Behavior: Behavior normal. Behavior is cooperative.        Thought Content: Thought content normal.        Cognition and Memory: Cognition and memory normal.        Judgment: Judgment normal.     Assessment  Plan  Chest pain, with elevating troponin - Plan: D-Dimer r/o PE/DVT, Quantitative, Ambulatory referral to Cardiology, Lipid panel, Troponin I (High Sensitivity), CANCELED: Troponin I (High Sensitivity) Results for ANDY, ALLENDE (MRN [id number]) as of 12/11/2018 14:51  Ref. Range 12/08/2018 03:36 12/08/2018 05:44 12/08/2018 08:49  Troponin I (High Sensitivity) Latest Ref Range: <18 ng/L 36 (H) 43 (H) 45 (H)  See if cards can work in sooner than 12/18/2018  Consider CTA chest and [place] legs   Hyperlipidemia, unspecified hyperlipidemia type - Plan: Lipid panel  HM Had flu shot had 11/10/2018  Tdap utd  Never smoker  Had MMR  and hep B shot 2nd series with work f/u with job titers in future -may want to get records of titer in future Declines urology dermatology for now 2/2 cost  rec D3 5000 IU qd    Provider: Dr. Olivia Mackie McLean-Scocuzza-Internal Medicine

## 2018-12-12 ENCOUNTER — Other Ambulatory Visit: Payer: Self-pay | Admitting: Internal Medicine

## 2018-12-12 ENCOUNTER — Ambulatory Visit (INDEPENDENT_AMBULATORY_CARE_PROVIDER_SITE_OTHER): Payer: 59 | Admitting: Internal Medicine

## 2018-12-12 ENCOUNTER — Encounter: Payer: Self-pay | Admitting: Internal Medicine

## 2018-12-12 VITALS — BP 140/87 | HR 70 | Ht 66.5 in | Wt 211.5 lb

## 2018-12-12 DIAGNOSIS — R0609 Other forms of dyspnea: Secondary | ICD-10-CM

## 2018-12-12 DIAGNOSIS — E782 Mixed hyperlipidemia: Secondary | ICD-10-CM | POA: Diagnosis not present

## 2018-12-12 DIAGNOSIS — R0789 Other chest pain: Secondary | ICD-10-CM | POA: Diagnosis not present

## 2018-12-12 DIAGNOSIS — R0600 Dyspnea, unspecified: Secondary | ICD-10-CM | POA: Diagnosis not present

## 2018-12-12 DIAGNOSIS — K219 Gastro-esophageal reflux disease without esophagitis: Secondary | ICD-10-CM

## 2018-12-12 MED ORDER — ESOMEPRAZOLE MAGNESIUM 40 MG PO CPDR: 40.0000 mg | DELAYED_RELEASE_CAPSULE | Freq: Every day | ORAL | 3 refills | Status: DC

## 2018-12-12 NOTE — Patient Instructions (Addendum)
Medication Instructions:  Your physician recommends that you continue on your current medications as directed. Please refer to the Current Medication list given to you today.  *If you need a refill on your cardiac medications before your next appointment, please call your pharmacy*  Lab Work: 1) COVID PRE- TEST: You will need a COVID TEST prior to the procedure:  LOCATION: [address] site.  DATE/TIME:  ______________________________________    If you have labs (blood work) drawn today and your tests are completely normal, you will receive your results only by: Marland Kitchen MyChart Message (if you have MyChart) OR . A paper copy in the mail If you have any lab test that is abnormal or we need to change your treatment, we will call you to review the results.  Testing/Procedures: 1) Your physician has requested that you have an echocardiogram. Echocardiography is a painless test that uses sound waves to create images of your heart. It provides your doctor with information about the size and shape of your heart and how well your heart's chambers and valves are working. This procedure takes approximately one hour. There are no restrictions for this procedure. You may get an IV, if needed, to receive an ultrasound enhancing agent through to better visualize your heart.   2) Your physician has requested that you have an exercise tolerance test. For further information please visit [email or web address]. Please also follow instruction sheet, as given.   DO NOT drink or eat foods with caffeine for 24 hours before the test. (Chocolate, coffee, tea, decaf coffee/tea, or energy drinks)  DO NOT smoke for 4 hours before your test.  If you use an inhaler, bring it with you to the test.  Wear comfortable shoes and clothing. Women do not wear dresses.   Follow-Up: At [facility], you and your health needs are our priority.  As part of our continuing mission to provide you with  exceptional heart care, we have created designated Provider Care Teams.  These Care Teams include your primary Cardiologist (physician) and Advanced Practice Providers (APPs -  Physician Assistants and Nurse Practitioners) who all work together to provide you with the care you need, when you need it.  Your next appointment:   1 month.  The format for your next appointment:   In Person  Provider:    You may see DR Harrell Gave END or one of the following Advanced Practice Providers on your designated Care Team:    Murray Hodgkins, NP  Christell Faith, PA-C  Marrianne Mood, PA-C    Exercise Stress Test An exercise stress test is a test to check how your heart works during exercise. You will need to walk on a treadmill or ride an exercise bike for this test. An electrocardiogram (ECG) will record your heartbeat when you are at rest and when you are exercising. You may have an ultrasound or nuclear test after the exercise test. The test is done to check for coronary artery disease (CAD). It is also done to:  See how well you can exercise.  Watch for high blood pressure during exercise.  Test how well you can exercise after treatment.  Check the blood flow to your arms and legs. If your test result is not normal, more testing may be needed. What happens before the procedure?  Follow instructions from your doctor about what you cannot eat or drink. ? Do not have any drinks or foods that have caffeine in them for 24 hours before the test,  or as told by your doctor. This includes coffee, tea (even decaf tea), sodas, chocolate, and cocoa.  Ask your doctor about changing or stopping your normal medicines. This is important if you: ? Take diabetes medicines. ? Take beta-blocker medicines. ? Wear a nitroglycerin patch.  If you use an inhaler, bring it with you to the test.  Do not put lotions, powders, creams, or oils on your chest before the test.  Wear comfortable shoes and clothing.   Do not use any products that have nicotine or tobacco in them, such as cigarettes and e-cigarettes. Stop using them at least 4 hours before the test. If you need help quitting, ask your doctor. What happens during the procedure?   Patches (electrodes) will be put on your chest.  Wires will be connected to the patches. The wires will send signals to a machine to record your heartbeat.  Your heart rate will be watched while you are resting and while you are exercising. Your blood pressure will also be watched during the test.  You will walk on a treadmill or use a stationary bike. If you cannot use these, you may be asked to turn a crank with your hands.  The activity will get harder and will raise your heart rate.  You may be asked to breathe into a tube a few times during the test. This measures the gases that you breathe out.  You will be asked how you are feeling throughout the test.  You will exercise until your heart reaches a target heart rate. You will stop early if: ? You feel dizzy. ? You have chest pain. ? You are out of breath. ? Your blood pressure is too high or too low. ? You have an irregular heartbeat. ? You have pain or aching in your arms or legs. The procedure may vary among doctors and hospitals. What happens after the procedure?  Your blood pressure, heart rate, breathing rate, and blood oxygen level will be watched after the test.  You may return to your normal diet and activities as told by your doctor.  It is up to you to get the results of your test. Ask your doctor, or the department that is doing the test, when your results will be ready. Summary  An exercise stress test is a test to check how your heart works during exercise.  This test is done to check for coronary artery disease.  Your heart rate will be watched while you are resting and while you are exercising.  Follow instructions from your doctor about what you cannot eat or drink before  the test. This information is not intended to replace advice given to you by your health care provider. Make sure you discuss any questions you have with your health care provider. Document Released: 07/11/2007 Document Revised: 05/06/2018 Document Reviewed: 04/24/2016 Elsevier Patient Education  [place].   Echocardiogram An echocardiogram is a procedure that uses painless sound waves (ultrasound) to produce an image of the heart. Images from an echocardiogram can provide important information about:  Signs of coronary artery disease (CAD).  Aneurysm detection. An aneurysm is a weak or damaged part of an artery wall that bulges out from the normal force of blood pumping through the body.  Heart size and shape. Changes in the size or shape of the heart can be associated with certain conditions, including heart failure, aneurysm, and CAD.  Heart muscle function.  Heart valve function.  Signs of a past heart  attack.  Fluid buildup around the heart.  Thickening of the heart muscle.  A tumor or infectious growth around the heart valves. Tell a health care provider about:  Any allergies you have.  All medicines you are taking, including vitamins, herbs, eye drops, creams, and over-the-counter medicines.  Any blood disorders you have.  Any surgeries you have had.  Any medical conditions you have.  Whether you are pregnant or may be pregnant. What are the risks? Generally, this is a safe procedure. However, problems may occur, including:  Allergic reaction to dye (contrast) that may be used during the procedure. What happens before the procedure? No specific preparation is needed. You may eat and drink normally. What happens during the procedure?   An IV tube may be inserted into one of your veins.  You may receive contrast through this tube. A contrast is an injection that improves the quality of the pictures from your heart.  A gel will be applied to your  chest.  A wand-like tool (transducer) will be moved over your chest. The gel will help to transmit the sound waves from the transducer.  The sound waves will harmlessly bounce off of your heart to allow the heart images to be captured in real-time motion. The images will be recorded on a computer. The procedure may vary among health care providers and hospitals. What happens after the procedure?  You may return to your normal, everyday life, including diet, activities, and medicines, unless your health care provider tells you not to do that. Summary  An echocardiogram is a procedure that uses painless sound waves (ultrasound) to produce an image of the heart.  Images from an echocardiogram can provide important information about the size and shape of your heart, heart muscle function, heart valve function, and fluid buildup around your heart.  You do not need to do anything to prepare before this procedure. You may eat and drink normally.  After the echocardiogram is completed, you may return to your normal, everyday life, unless your health care provider tells you not to do that. This information is not intended to replace advice given to you by your health care provider. Make sure you discuss any questions you have with your health care provider. Document Released: 01/20/2000 Document Revised: 05/15/2018 Document Reviewed: 02/25/2016 Elsevier Patient Education  2020 [employer].

## 2018-12-12 NOTE — Progress Notes (Signed)
New Outpatient Visit Date: 12/12/2018  Referring Provider: McLean-Scocuzza, Nino Glow, MD [place],  [place] [address]  Chief Complaint: Chest pain  HPI:  Marcus Roberts is a 43 y.o. male who is being seen today for the evaluation of chest pain at the request of Dr. Terese Door. He has a history of hyperlipidemia, GERD, obesity status post bariatric surgery, and kidney stones.  He reports chest pain off and on for the last couple of weeks.  He describes it as tightness and burning in the center of his chest.  He often feels "pasty" when it happens and notes that his heart rate is elevated.  Sunday night (5 days ago), he had more severe episode that woke him from sleep and led to evaluation in the emergency department.  Work-up was unrevealing other than minimally elevated high-sensitivity troponin that remained flat.  He has used as needed use omeprazole in the past but has been taking it daily for the last few days.  He has not had any further chest discomfort during this time.  In retrospect, however, he reports that over the last 2 weeks he has experienced vague tightness in his chest when he walks across his yard as well as mild dyspnea.  He describes it as feeling like he has a cold in his chest.  Marcus Roberts denies a history of prior cardiac disease and testing.  He denies lightheadedness, edema, orthopnea, and PND.  He has not had significant palpitations other than the aforementioned elevations in his heart rate that seem to accompany his chest pain at times.  He consumes 2 cups of "half caff" coffee per day but otherwise no significant caffeine.  --------------------------------------------------------------------------------------------------  Cardiovascular History & Procedures: Cardiovascular Problems:  Chest pain  Palpitations/tachycardia  Risk Factors:  Hyperlipidemia, male gender, obesity, and family history.  Cath/PCI:  None.  CV Surgery:  None.  EP  Procedures and Devices:  None.  Non-Invasive Evaluation(s):  None.  Recent CV Pertinent Labs: Lab Results  Component Value Date   CHOL 218 (H) 06/13/2017   HDL 42.80 06/13/2017   LDLCALC 140 (H) 06/13/2017   TRIG 173.0 (H) 06/13/2017   CHOLHDL 5 06/13/2017   K 3.9 12/08/2018   BUN 12 12/08/2018   CREATININE 1.00 12/08/2018    --------------------------------------------------------------------------------------------------  Past Medical History:  Diagnosis Date  . Anxiety   . Depression   . GERD (gastroesophageal reflux disease)   . Hyperlipidemia   . Kidney stones     Past Surgical History:  Procedure Laterality Date  . CHOLECYSTECTOMY    . vertical sleeve gastrectomy     20 13 85% stomach removed was 280 lbs before surgery     Current Meds  Medication Sig  . aspirin EC 81 MG tablet Take 81 mg by mouth daily. Taking 5 days per week  . cyclobenzaprine (FLEXERIL) 10 MG tablet Take 10 mg by mouth 3 (three) times daily as needed for muscle spasms.  Marland Kitchen esomeprazole (NEXIUM) 40 MG capsule Take 1 capsule (40 mg total) by mouth daily. 30 minutes before food  . fluticasone (FLONASE) 50 MCG/ACT nasal spray Place into both nostrils daily.  Marland Kitchen L-Arginine 500 MG CAPS Take 1,000 mg by mouth daily.   Marland Kitchen LORazepam (ATIVAN) 1 MG tablet Take 1 tablet (1 mg total) by mouth daily as needed for anxiety.    Allergies: Patient has no known allergies.  Social History   Tobacco Use  . Smoking status: Never Smoker  . Smokeless tobacco: Never Used  Substance Use Topics  . Alcohol use: Yes    Comment: occas. 2-3 x per year   . Drug use: Never    Family History  Problem Relation Age of Onset  . Diabetes Father   . Heart disease Father        x2  . Hyperlipidemia Father   . Hypertension Father   . Heart attack Father 42       x2  . Heart disease Brother        MI in 39s  . Heart attack Brother 40    Review of Systems: A 12-system review of systems was performed and was  negative except as noted in the HPI.  --------------------------------------------------------------------------------------------------  Physical Exam: BP 140/87 (BP Location: Right Arm, Patient Position: Sitting, Cuff Size: Normal)   Pulse 70   Ht 5' 6.5" (1.689 m)   Wt 211 lb 8 oz (95.9 kg)   SpO2 98%   BMI 33.63 kg/m   General: NAD. HEENT: No conjunctival pallor or scleral icterus.  Facemask in place. Neck: Supple without lymphadenopathy, thyromegaly, JVD, or HJR. No carotid bruit. Lungs: Normal work of breathing. Clear to auscultation bilaterally without wheezes or crackles. Heart: Regular rate and rhythm without murmurs, rubs, or gallops. Non-displaced PMI. Abd: Bowel sounds present. Soft, NT/ND without hepatosplenomegaly Ext: No lower extremity edema. Radial, PT, and DP pulses are 2+ bilaterally Skin: Warm and dry without rash. Neuro: CNIII-XII intact. Strength and fine-touch sensation intact in upper and lower extremities bilaterally. Psych: Normal mood and affect.  EKG: Normal sinus rhythm with sinus arrhythmia.  No significant abnormality.  Lab Results  Component Value Date   WBC 6.6 12/08/2018   HGB 15.1 12/08/2018   HCT 43.7 12/08/2018   MCV 89.0 12/08/2018   PLT 200 12/08/2018    Lab Results  Component Value Date   NA 141 12/08/2018   K 3.9 12/08/2018   CL 103 12/08/2018   CO2 27 12/08/2018   BUN 12 12/08/2018   CREATININE 1.00 12/08/2018   GLUCOSE 120 (H) 12/08/2018   ALT 20 12/08/2018    Lab Results  Component Value Date   CHOL 218 (H) 06/13/2017   HDL 42.80 06/13/2017   LDLCALC 140 (H) 06/13/2017   TRIG 173.0 (H) 06/13/2017   CHOLHDL 5 06/13/2017     --------------------------------------------------------------------------------------------------  ASSESSMENT AND PLAN: Atypical chest pain and dyspnea: Chest pain has been present for about 2 weeks.  There is a subtle exertional component, though the most severe episode happened at rest.   This led to ED evaluation where minimal troponin elevation without upward trend was noted.  This finding is nonspecific.  His episodes have at times been accompanied by elevated heart rates.  I wonder if there could be a transient tachyarrhythmia contributing to his symptoms.  His examination and EKG are normal today.  Risk factors include hyperlipidemia, obesity, male gender, and family history of premature coronary artery disease.  We will obtain a transthoracic echocardiogram and exercise tolerance test.  If these tests are normal, coronary calcium score may be helpful for further risk stratification and optimization of medical therapy in the setting of a strong family history of ASCVD.  I will defer medication changes at this time.  Based on results of testing, we may need to consider further assessment for ischemic heart disease versus event monitor placement.  I think it is reasonable to continue with esoomeprazole for now.  Hyperlipidemia: We will work on lifestyle modifications for the time being.  However,  if there is evidence of ASCVD, pharmacotherapy may need to be considered.  Follow-up: Return to clinic in 1 month.  Nelva Bush, MD 12/14/2018 11:07 AM

## 2018-12-14 ENCOUNTER — Encounter: Payer: Self-pay | Admitting: Internal Medicine

## 2018-12-16 ENCOUNTER — Other Ambulatory Visit: Payer: Self-pay | Admitting: *Deleted

## 2018-12-16 NOTE — Addendum Note (Signed)
Addended by: Othelia Pulling C on: 12/16/2018 11:30 AM   Modules accepted: Orders

## 2018-12-17 ENCOUNTER — Other Ambulatory Visit: Payer: Self-pay

## 2018-12-17 ENCOUNTER — Other Ambulatory Visit
Admission: RE | Admit: 2018-12-17 | Discharge: 2018-12-17 | Disposition: A | Discharge status: Home / Self Care | Payer: 59 | Source: Ambulatory Visit | Attending: Internal Medicine | Admitting: Internal Medicine

## 2018-12-17 DIAGNOSIS — Z20828 Contact with and (suspected) exposure to other viral communicable diseases: Secondary | ICD-10-CM | POA: Insufficient documentation

## 2018-12-17 DIAGNOSIS — Z01812 Encounter for preprocedural laboratory examination: Secondary | ICD-10-CM | POA: Insufficient documentation

## 2018-12-17 LAB — SARS CORONAVIRUS 2 (TAT 6-24 HRS): SARS Coronavirus 2: NEGATIVE

## 2018-12-18 ENCOUNTER — Ambulatory Visit: Payer: 59 | Admitting: Cardiology

## 2018-12-18 ENCOUNTER — Encounter (INDEPENDENT_AMBULATORY_CARE_PROVIDER_SITE_OTHER): Payer: 59

## 2018-12-18 DIAGNOSIS — R0600 Dyspnea, unspecified: Secondary | ICD-10-CM | POA: Diagnosis not present

## 2018-12-18 DIAGNOSIS — R0789 Other chest pain: Secondary | ICD-10-CM

## 2018-12-18 DIAGNOSIS — R0609 Other forms of dyspnea: Secondary | ICD-10-CM

## 2018-12-19 ENCOUNTER — Telehealth: Payer: Self-pay | Admitting: Internal Medicine

## 2018-12-19 DIAGNOSIS — R079 Chest pain, unspecified: Secondary | ICD-10-CM

## 2018-12-19 LAB — EXERCISE TOLERANCE TEST
Estimated workload: 11.1 METS
Exercise duration (min): 9 min
Exercise duration (sec): 39 s
MPHR: 179 {beats}/min
Peak HR: 157 {beats}/min
Percent HR: 87 %
Rest HR: 76 {beats}/min

## 2018-12-19 NOTE — Telephone Encounter (Signed)
Attempted to reach patient by phone but did not receive an answer.  Left message stating that I will try calling back on Monday to discuss results of ETT.  This showed 2 mm ST depressions in V4-6 during stress.  Patient was reportedly asymptomatic.  Given recent ED visit for chest pain and stress test findings, I recommend that we proceed with cardiac catheterization at the patient's earliest convenience.  I will try reaching out to him again on Monday to discuss this further.  Nelva Bush, MD [facility] Pager: [phone]

## 2018-12-22 MED ORDER — METOPROLOL TARTRATE 25 MG PO TABS: 12.5000 mg | ORAL_TABLET | Freq: Two times a day (BID) | ORAL | 1 refills | Status: DC

## 2018-12-22 NOTE — Telephone Encounter (Signed)
Results of exercise tolerance test performed last week were discussed with Marcus Roberts.  He did not have any angina while on the treadmill but was noted to have 2 mm ST depression in V4 through V6.  He has noted some sporadic chest discomfort, though it has been minimal the last few days and is not exertional.  He wonders if it could be GI related.  We discussed further evaluation options, including cardiac CTA and cardiac catheterization.  Marcus Roberts would like to proceed with noninvasive testing; we will therefore make arrangements for cardiac CTA at his earliest convenience.  I recommended adding metoprolol tartrate 12.5 mg twice daily as well as continuation of aspirin 81 mg daily.  If his chest pain were to worsen at all, he should contact us for further evaluation or seek immediate medical attention.  Nelva Bush, MD [facility] Pager: [phone]

## 2018-12-22 NOTE — Telephone Encounter (Signed)
Called patient. Metoprolol sent to pharmacy and patient verbalized understanding of plan of care including going over the instructions verbally over the phone. Instructions sent to patient via MyChart as well. Message sent to Mack Guise and precert.   Your cardiac CT will be scheduled at one of the below locations:   [facility] [address] [place], [place] [address] [phone] [place] [address] [place] [address] [phone]  If scheduled at [facility], please arrive at the [facility] main entrance of [facility] 30-45 minutes prior to test start time. Proceed to the [facility] Radiology Department (first floor) to check-in and test prep.  If scheduled at [facility], please arrive 15 mins early for check-in and test prep.  Please follow these instructions carefully (unless otherwise directed):  Hold all erectile dysfunction medications at least 3 days (72 hrs) prior to test.  On the Night Before the Test: . Be sure to Drink plenty of water. . Do not consume any caffeinated/decaffeinated beverages or chocolate 12 hours prior to your test. . Do not take any antihistamines 12 hours prior to your test.  On the Day of the Test: . Drink plenty of water. Do not drink any water within one hour of the test. . Do not eat any food 4 hours prior to the test. . You may take your regular medications prior to the test.  . Take metoprolol (Lopressor) two hours prior to test.      After the Test: . Drink plenty of water. . After receiving IV contrast, you may experience a mild flushed feeling. This is normal. . On occasion, you may experience a mild rash up to 24 hours after the test. This is not dangerous. If this occurs, you can take Benadryl 25 mg and increase your fluid intake. . If you experience trouble breathing, this can be serious. If it is severe  call 911 IMMEDIATELY. If it is mild, please call our office.  Once we have confirmed authorization from your insurance company, we will call you to set up a date and time for your test.   For non-scheduling related questions, please contact the cardiac imaging nurse navigator should you have any questions/concerns: Marchia Bond, RN Navigator Cardiac Imaging [facility] [phone] Office

## 2018-12-22 NOTE — Addendum Note (Signed)
Addended by: Vanessa Ralphs on: 12/22/2018 12:15 PM   Modules accepted: Orders

## 2018-12-26 ENCOUNTER — Ambulatory Visit (INDEPENDENT_AMBULATORY_CARE_PROVIDER_SITE_OTHER): Payer: 59

## 2018-12-26 ENCOUNTER — Other Ambulatory Visit: Payer: Self-pay

## 2018-12-26 DIAGNOSIS — R0789 Other chest pain: Secondary | ICD-10-CM | POA: Diagnosis not present

## 2018-12-26 DIAGNOSIS — R0600 Dyspnea, unspecified: Secondary | ICD-10-CM | POA: Diagnosis not present

## 2018-12-26 DIAGNOSIS — R0609 Other forms of dyspnea: Secondary | ICD-10-CM

## 2018-12-31 ENCOUNTER — Ambulatory Visit: Payer: 59

## 2019-01-02 DIAGNOSIS — E782 Mixed hyperlipidemia: Secondary | ICD-10-CM

## 2019-01-06 ENCOUNTER — Other Ambulatory Visit: Payer: 59

## 2019-01-06 ENCOUNTER — Encounter

## 2019-01-06 ENCOUNTER — Other Ambulatory Visit: Payer: Self-pay

## 2019-01-06 MED ORDER — ATORVASTATIN CALCIUM 20 MG PO TABS: 20.0000 mg | ORAL_TABLET | Freq: Every day | ORAL | 11 refills | Status: DC

## 2019-01-07 NOTE — Telephone Encounter (Signed)
Hi Niko Jakel,   I went ahead and started Mr. Shark on atorvastatin. Could you help arrange for a fasting lipid panel and ALT in ~3 months? Thanks.   Gerald Stabs

## 2019-01-08 ENCOUNTER — Other Ambulatory Visit: Payer: 59

## 2019-01-13 ENCOUNTER — Telehealth: Payer: Self-pay | Admitting: *Deleted

## 2019-01-13 DIAGNOSIS — R0609 Other forms of dyspnea: Secondary | ICD-10-CM

## 2019-01-13 DIAGNOSIS — R0600 Dyspnea, unspecified: Secondary | ICD-10-CM

## 2019-01-13 DIAGNOSIS — R079 Chest pain, unspecified: Secondary | ICD-10-CM

## 2019-01-13 DIAGNOSIS — Z01810 Encounter for preprocedural cardiovascular examination: Secondary | ICD-10-CM

## 2019-01-13 NOTE — Telephone Encounter (Signed)
Message -----  From: Alta Corning  Sent: 01/13/2019  8:29 AM EST  To: Jeannette How  Subject: FW: Patient Finance Issue- Cardiac CT       Good Morning Izora Gala,   I was able to speak with [facility] Patient Access Manager and confirmed that the patient would be responsible for $1,208.20 but this is not a upfront cost. They do attempt to collect this amount before services are provided but the patient can be billed for this amount and set up a payment plan after their claim is processed by their insurance provider.   Unfortunately, payment plans cannot be set up before the claim is processed per patient accounting.   Please let me know if any additional information is needed. Mr. Henningsen can be placed back on the schedule for his test if he is fine with being billed this amount.   Thank you,  Alta Corning

## 2019-01-13 NOTE — Telephone Encounter (Signed)
No answer. Left message to call back.   

## 2019-01-14 NOTE — Telephone Encounter (Signed)
Spoke with patient concerning the below message on payment for service. He verbalized understanding. He is willing proceed with Cardiac CT. Number given to patient to call and reschedule. He is also aware he will need a BMET at the [facility] prior to. Also, rescheduled his appointment for tomorrow with Dr End out a few weeks to give time for the test to be done.  He was appreciative.

## 2019-01-14 NOTE — Telephone Encounter (Signed)
Patient is returning your call.  

## 2019-01-15 ENCOUNTER — Ambulatory Visit: Payer: 59 | Admitting: Internal Medicine

## 2019-01-16 ENCOUNTER — Ambulatory Visit: Payer: 59 | Admitting: Internal Medicine

## 2019-01-28 ENCOUNTER — Encounter (HOSPITAL_COMMUNITY): Payer: Self-pay

## 2019-01-28 ENCOUNTER — Telehealth (HOSPITAL_COMMUNITY): Payer: Self-pay | Admitting: Emergency Medicine

## 2019-01-28 ENCOUNTER — Other Ambulatory Visit: Payer: Self-pay | Admitting: Internal Medicine

## 2019-01-28 DIAGNOSIS — F419 Anxiety disorder, unspecified: Secondary | ICD-10-CM

## 2019-01-28 MED ORDER — LORAZEPAM 1 MG PO TABS: 1.0000 mg | ORAL_TABLET | Freq: Every day | ORAL | 2 refills | Status: DC | PRN

## 2019-01-28 NOTE — Telephone Encounter (Signed)
Reaching out to patient to offer assistance regarding upcoming cardiac imaging study; pt verbalizes understanding of appt date/time, parking situation and where to check in, pre-test NPO status and medications ordered, and verified current allergies; name and call back number provided for further questions should they arise Mahlon Gabrielle RN Navigator Cardiac Imaging [facility] [phone] office [phone] cell 

## 2019-02-02 ENCOUNTER — Telehealth: Payer: Self-pay | Admitting: Internal Medicine

## 2019-02-02 ENCOUNTER — Ambulatory Visit
Admission: RE | Admit: 2019-02-02 | Discharge: 2019-02-02 | Disposition: A | Discharge status: Home / Self Care | Payer: 59 | Source: Ambulatory Visit | Attending: Internal Medicine | Admitting: Internal Medicine

## 2019-02-02 ENCOUNTER — Other Ambulatory Visit: Payer: Self-pay

## 2019-02-02 DIAGNOSIS — R079 Chest pain, unspecified: Secondary | ICD-10-CM

## 2019-02-02 DIAGNOSIS — I2510 Atherosclerotic heart disease of native coronary artery without angina pectoris: Secondary | ICD-10-CM

## 2019-02-02 MED ORDER — IOHEXOL 350 MG/ML SOLN
100.0000 mL | Freq: Once | INTRAVENOUS | Status: AC | PRN
  Administered 2019-02-02: 100 mL via INTRAVENOUS

## 2019-02-02 MED ORDER — NITROGLYCERIN 0.4 MG SL SUBL: 0.4000 mg | SUBLINGUAL_TABLET | SUBLINGUAL | 99 refills | Status: DC | PRN

## 2019-02-02 MED ORDER — METOPROLOL TARTRATE 5 MG/5ML IV SOLN
5.0000 mg | INTRAVENOUS | Status: DC | PRN
  Administered 2019-02-02 (×2): 5 mg via INTRAVENOUS

## 2019-02-02 MED ORDER — NITROGLYCERIN 0.4 MG SL SUBL
0.8000 mg | SUBLINGUAL_TABLET | Freq: Once | SUBLINGUAL | Status: AC
  Administered 2019-02-02: 0.8 mg via SUBLINGUAL

## 2019-02-02 NOTE — Progress Notes (Signed)
Patient tolerated CT without incident. Drank coffee after. Ambulatory to exit steady gait.

## 2019-02-02 NOTE — Telephone Encounter (Signed)
Results of CTA discussed with Marcus Roberts.  Given findings of significant two-vessel CAD, I have recommended that we proceed with cardiac catheterization.  We will arrange for him to be reevaluated in the office within the next week and discuss proceeding with catheterization again.  I favor scheduling the catheterization to be performed at [facility] in case atherectomy as needed given significant calcium burden noted on cardiac CTA.  I will send in a prescription for sublingual nitroglycerin to be used as needed for chest pain.  We will continue aspirin, metoprolol, and atorvastatin.  I advised Marcus Roberts to seek immediate medical attention should he have recurrent chest pain that does not resolve promptly with rest or sublingual nitroglycerin.  Nelva Bush, MD [facility]

## 2019-02-03 NOTE — Telephone Encounter (Signed)
Called patient and scheduled him to see Ignacia Bayley, NP tomorrow morning at 9:15 am. Patient very appreciative and aware of appointment date and time and process.

## 2019-02-04 ENCOUNTER — Encounter: Payer: Self-pay | Admitting: Nurse Practitioner

## 2019-02-04 ENCOUNTER — Ambulatory Visit (INDEPENDENT_AMBULATORY_CARE_PROVIDER_SITE_OTHER): Payer: 59 | Admitting: Nurse Practitioner

## 2019-02-04 ENCOUNTER — Ambulatory Visit: Payer: 59 | Admitting: Nurse Practitioner

## 2019-02-04 ENCOUNTER — Other Ambulatory Visit: Payer: Self-pay

## 2019-02-04 VITALS — BP 120/80 | HR 73 | Temp 98.2°F | Ht 66.0 in | Wt 215.2 lb

## 2019-02-04 DIAGNOSIS — I200 Unstable angina: Secondary | ICD-10-CM

## 2019-02-04 DIAGNOSIS — R079 Chest pain, unspecified: Secondary | ICD-10-CM | POA: Diagnosis not present

## 2019-02-04 DIAGNOSIS — E785 Hyperlipidemia, unspecified: Secondary | ICD-10-CM

## 2019-02-04 DIAGNOSIS — I25110 Atherosclerotic heart disease of native coronary artery with unstable angina pectoris: Secondary | ICD-10-CM

## 2019-02-04 DIAGNOSIS — I2510 Atherosclerotic heart disease of native coronary artery without angina pectoris: Secondary | ICD-10-CM | POA: Insufficient documentation

## 2019-02-04 MED ORDER — ATORVASTATIN CALCIUM 80 MG PO TABS: 80.0000 mg | ORAL_TABLET | Freq: Every day | ORAL | 11 refills | Status: DC

## 2019-02-04 MED ORDER — ISOSORBIDE MONONITRATE ER 30 MG PO TB24: 30.0000 mg | ORAL_TABLET | Freq: Every day | ORAL | 3 refills | Status: DC

## 2019-02-04 NOTE — Addendum Note (Signed)
Addended by: Murray Hodgkins R on: 02/04/2019 01:06 PM   Modules accepted: Orders, SmartSet

## 2019-02-04 NOTE — Patient Instructions (Addendum)
Medication Instructions:  1- INCREASE Lipitor Take 1 tablet (80 mg total) by mouth daily 2- START Imdur Take 1 tablet (30 mg total) by mouth daily  *If you need a refill on your cardiac medications before your next appointment, please call your pharmacy*  Lab Work: Your physician recommends that you have lab work today(CBC, BMET)  CV19 Pre admit testing DRIVE THRU  Please report to the PAT testing site ([facility]) on _____1/5____ date ______12:30-2:30PM_____ time for your DRIVE THRU covid testing that is required prior to your procedure.  Following covid testing, please remain in quarantine. If you must be around others, please wash hands, avoid touching face and wear your mask.    If you have labs (blood work) drawn today and your tests are completely normal, you will receive your results only by: Marland Kitchen MyChart Message (if you have MyChart) OR . A paper copy in the mail If you have any lab test that is abnormal or we need to change your treatment, we will call you to review the results.  Testing/Procedures:    [place], [place] [address] Dept: [phone] Loc: [place]  02/04/2019  You are scheduled for a Cardiac Catheterization on Friday, January 8 with Dr. Harrell Gave End.  1. Please arrive at the [facility] (Main Entrance A) at [facility]: [place], [place] [address] at 5:30 AM (This time is two hours before your procedure to ensure your preparation). Free valet parking service is available.   Special note: Every effort is made to have your procedure done on time. Please understand that emergencies sometimes delay scheduled procedures.  2. Diet: Do not eat solid foods after midnight.  The patient may have clear liquids until 5am upon the day of the procedure.  3. Labs: done 4. Medication instructions in  preparation for your procedure:  On the morning of your procedure, take your Aspirin and any morning medicines NOT listed above.  You may use sips of water.  5. Plan for one night stay--bring personal belongings. 6. Bring a current list of your medications and current insurance cards. 7. You MUST have a responsible person to drive you home. 8. Someone MUST be with you the first 24 hours after you arrive home or your discharge will be delayed. 9. Please wear clothes that are easy to get on and off and wear slip-on shoes.  Thank you for allowing [place] to care for you!   -- [facility]   Follow-Up: At [facility], you and your health needs are our priority.  As part of our continuing mission to provide you with exceptional heart care, we have created designated Provider Care Teams.  These Care Teams include your primary Cardiologist (physician) and Advanced Practice Providers (APPs -  Physician Assistants and Nurse Practitioners) who all work together to provide you with the care you need, when you need it.  Your next appointment:   2 week(s)  The format for your next appointment:   In Person  Provider:    You may see Nelva Bush, MD or Murray Hodgkins, NP.

## 2019-02-04 NOTE — Telephone Encounter (Signed)
Discussed with Ignacia Bayley, NP and we were able to locate the results. Though the results had not been located in another general area as normal. Called patient and he is agreeable to come on in for appointment.  Ignacia Bayley, NP said it was ok to add patient on whenever patient was able to come in. Patient was very Patent attorney. He is on his way.

## 2019-02-04 NOTE — H&P (View-Only) (Signed)
Cardiology Clinic Note   Patient Name: Marcus Roberts Date of Encounter: 02/04/2019  Primary Care Provider:  McLean-Scocuzza, Nino Glow, MD Primary Cardiologist:  Nelva Bush, MD  Patient Profile    43 y/o ? with a history of hyperlipidemia, family history of premature CAD, GERD, obesity status post gastric sleeve, and nephrolithiasis, who presents for follow-up related to chest pain and abnormal coronary CTA.  Past Medical History    Past Medical History:  Diagnosis Date  . Anxiety   . CAD (coronary artery disease)    a. 12/2018 ETT: Ex time 9:39. 61mm horiz ST dep in V4-V6; b. 01/2019 Cor CTA: Ca2+ = 923 (99th%'ile). LM nl, LAD 50-69p, >70p/m. D1/2 small, LCX nondom 25-49, RCA dom, mild plaque p/m, >70d. Ao Atherosclerosis.  . Chest pain   . Depression   . GERD (gastroesophageal reflux disease)   . History of echocardiogram    a. 12/2018 Echo: EF 60-65%, no rwma. Mildly dil LA.  Marland Kitchen Hyperlipidemia   . Kidney stones    Past Surgical History:  Procedure Laterality Date  . CHOLECYSTECTOMY    . vertical sleeve gastrectomy     2013 85% stomach removed was 280 lbs before surgery     Allergies  No Known Allergies  History of Present Illness    43 year old male with the above past medical history including hyperlipidemia, family history of premature CAD, GERD, obesity status post gastric sleeve, and nephrolithiasis.  He was evaluated by Dr. Saunders Revel in November in the setting of a several week history of intermittent chest tightness and burning.  Initially underwent exercise treadmill testing in November which was notable for good exercise tolerance, walking 9: 39 however, he developed 2 mm horizontal ST segment depression in leads V4 through V6.  In light of ST segment changes, the study was felt to be of intermediate risk and he was referred for coronary CT angiography.  This was just performed on December 28 and he was found to have a coronary calcium score of 923, placing him in the  99th percentile for his age/sex.  Further, he was noted to have greater than 70% proximal and mid stenosis within the LAD and also in the distal RCA.  In this setting, follow-up was arranged to discuss diagnostic catheterization.  Since his last visit, he notes some amount of either chest discomfort or dyspnea on a daily basis.  Episodes of chest discomfort occur with both exertion and rest without any particular predictable pattern.  There are some times where he notes dyspnea and chest discomfort when walking across the room and others where he can walk 2 miles without any symptoms or limitations.  He denies palpitations, PND, orthopnea, dizziness, syncope, edema, or early satiety.  We discussed test results and recommendation for diagnostic catheterization at length today and he is interested in proceeding.  Home Medications    Prior to Admission medications   Medication Sig Start Date End Date Taking? Authorizing Provider  aspirin EC 81 MG tablet Take 81 mg by mouth daily. Taking 5 days per week    [provider]  atorvastatin (LIPITOR) 20 MG tablet Take 1 tablet (20 mg total) by mouth daily. 01/06/19 01/06/20  End, Harrell Gave, MD  cyclobenzaprine (FLEXERIL) 10 MG tablet Take 10 mg by mouth 3 (three) times daily as needed for muscle spasms.    [provider]  esomeprazole (NEXIUM) 40 MG capsule Take 1 capsule (40 mg total) by mouth daily. 30 minutes before food 12/12/18  McLean-Scocuzza, Nino Glow, MD  fluticasone (FLONASE) 50 MCG/ACT nasal spray Place into both nostrils daily.    [provider]  L-Arginine 500 MG CAPS Take 1,000 mg by mouth daily.     [provider]  LORazepam (ATIVAN) 1 MG tablet Take 1 tablet (1 mg total) by mouth daily as needed for anxiety. 01/28/19   McLean-Scocuzza, Nino Glow, MD  metoprolol tartrate (LOPRESSOR) 25 MG tablet Take 0.5 tablets (12.5 mg total) by mouth 2 (two) times daily. 12/22/18 03/22/19  End, Harrell Gave, MD    nitroGLYCERIN (NITROSTAT) 0.4 MG SL tablet Place 1 tablet (0.4 mg total) under the tongue every 5 (five) minutes as needed for chest pain. 02/02/19   End, Harrell Gave, MD    Family History    Family History  Problem Relation Age of Onset  . Diabetes Father   . Heart disease Father        x2  . Hyperlipidemia Father   . Hypertension Father   . Heart attack Father 44       x2  . Heart disease Brother        MI in 16s  . Heart attack Brother 73   He indicated that his mother is alive. He indicated that his father is alive. He indicated that his brother is alive.  Social History    Social History   Socioeconomic History  . Marital status: Married    Spouse name: Not on file  . Number of children: Not on file  . Years of education: Not on file  . Highest education level: Not on file  Occupational History  . Occupation: medical coding     Comment: [employer]  Tobacco Use  . Smoking status: Never Smoker  . Smokeless tobacco: Never Used  Substance and Sexual Activity  . Alcohol use: Yes    Comment: occas. 2-3 x per year   . Drug use: Never  . Sexual activity: Yes    Partners: Female  Other Topics Concern  . Not on file  Social History Narrative   Moved from [place]    Married    Some college    Careers information officer for [employer]   Owns work, wears seat belt, safe in relationship    Social Determinants of Health   Financial Resource Strain:   . Difficulty of Paying Living Expenses: Not on file  Food Insecurity:   . Worried About Charity fundraiser in the Last Year: Not on file  . Ran Out of Food in the Last Year: Not on file  Transportation Needs:   . Lack of Transportation (Medical): Not on file  . Lack of Transportation (Non-Medical): Not on file  Physical Activity:   . Days of Exercise per Week: Not on file  . Minutes of Exercise per Session: Not on file  Stress:   . Feeling of Stress : Not on file  Social Connections:   . Frequency of Communication with Friends and  Family: Not on file  . Frequency of Social Gatherings with Friends and Family: Not on file  . Attends Religious Services: Not on file  . Active Member of Clubs or Organizations: Not on file  . Attends Archivist Meetings: Not on file  . Marital Status: Not on file  Intimate Partner Violence:   . Fear of Current or Ex-Partner: Not on file  . Emotionally Abused: Not on file  . Physically Abused: Not on file  . Sexually Abused: Not on file  Review of Systems    General:  No chills, fever, night sweats or weight changes.  Cardiovascular: +++ Rest and exertional chest pain, +++ periodic rest and exertional dyspnea, no edema, orthopnea, palpitations, paroxysmal nocturnal dyspnea. Dermatological: No rash, lesions/masses Respiratory: No cough, +++ dyspnea Urologic: No hematuria, dysuria Abdominal:   No nausea, vomiting, diarrhea, bright red blood per rectum, melena, or hematemesis Neurologic:  No visual changes, wkns, changes in mental status. All other systems reviewed and are otherwise negative except as noted above.  Physical Exam    VS:  BP 120/80 (BP Location: Left Arm, Patient Position: Sitting, Cuff Size: Normal)   Pulse 73   Temp 98.2 F (36.8 C)   Ht 5\' 6"  (1.676 m)   Wt 215 lb 4 oz (97.6 kg)   BMI 34.74 kg/m  , BMI Body mass index is 34.74 kg/m. GEN: Well nourished, well developed, in no acute distress. HEENT: normal. Neck: Supple, no JVD, carotid bruits, or masses. Cardiac: RRR, no murmurs, rubs, or gallops. No clubbing, cyanosis, edema.  Radials/DP/PT 2+ and equal bilaterally.  Abnormal Allen's test on the right.  Normal Allen's on the left. Respiratory:  Respirations regular and unlabored, clear to auscultation bilaterally. GI: Soft, nontender, nondistended, BS + x 4. MS: no deformity or atrophy. Skin: warm and dry, no rash. Neuro:  Strength and sensation are intact. Psych: Normal affect.  Accessory Clinical Findings    ECG personally reviewed by me  today-regular sinus rhythm, 73 - No acute changes  Lab Results  Component Value Date   WBC 6.6 12/08/2018   HGB 15.1 12/08/2018   HCT 43.7 12/08/2018   MCV 89.0 12/08/2018   PLT 200 12/08/2018   Lab Results  Component Value Date   CREATININE 1.00 12/08/2018   BUN 12 12/08/2018   NA 141 12/08/2018   K 3.9 12/08/2018   CL 103 12/08/2018   CO2 27 12/08/2018   Lab Results  Component Value Date   ALT 20 12/08/2018   AST 20 12/08/2018   ALKPHOS 56 12/08/2018   BILITOT 0.9 12/08/2018   Lab Results  Component Value Date   CHOL 218 (H) 06/13/2017   HDL 42.80 06/13/2017   LDLCALC 140 (H) 06/13/2017   TRIG 173.0 (H) 06/13/2017   CHOLHDL 5 06/13/2017     Assessment & Plan   1.  Unstable angina/coronary artery disease: Patient with greater than 1 month history of rest and exertional substernal chest discomfort along with intermittent dyspnea.  Somewhat unpredictable nature of symptoms however, treadmill testing in November was notable for 2 mm horizontal ST segment depression in leads V4 through V6, followed by coronary CT angiography with greater than 70% proximal and mid LAD stenosis as well as distal RCA stenosis.  Further, coronary calcium score was markedly elevated at 923, placing him in the 99th percentile for age and sex.  Since his last office visit, he has continued to have intermittent symptoms.  We discussed test results at length along with plan for diagnostic catheterization.  The patient understands that risks include but are not limited to stroke (1 in 1000), death (1 in 25), kidney failure [usually temporary] (1 in 500), bleeding (1 in 200), allergic reaction [possibly serious] (1 in 200), and agrees to proceed.  I will obtain CBC and basic metabolic panel today and he will undergo Covid testing next week with plan for cath plus/minus PCI on Friday, January 8 with Dr. Saunders Revel in [place].  He remains on aspirin and beta-blocker therapy.  Given  findings on CT, I am going to  escalate statin therapy to atorvastatin 80 mg daily.  Further, given almost daily symptoms, I am adding low-dose long-acting nitrate therapy.  2.  Hyperlipidemia: LDL was 140 May 2019.  He was placed on atorvastatin 20 mg recently.  In light of abnormal coronary CT, I am escalating dose to 80 mg daily with a goal LDL less than 70.  Plan to follow-up lipids and LFTs in approximately 4 to 6 weeks.  3.  Obesity: Status post prior gastric sleeve.  He did ask which diet we would prefer as he is considering a keto diet.  I recommended that he consider a Mediterranean diet and a program such as weight watchers that will help him in learning appropriate portion sizes.  4.  Disposition: CBC and basic metabolic panel today.  Patient will have Covid testing next week with plan for diagnostic catheterization on January 8.  Plan to follow-up in clinic within the next 2 to 3 weeks.  Murray Hodgkins, NP 02/04/2019, 12:49 PM

## 2019-02-04 NOTE — Progress Notes (Deleted)
Cardiology Office Note  Date: 02/04/2019   ID: Marcus Roberts, DOB 14-Aug-1975, MRN [id number]  PCP:  McLean-Scocuzza, Nino Glow, MD  Cardiologist:  No primary care provider on file. Electrophysiologist:  None   No chief complaint on file.   History of Present Illness: Marcus Roberts is a 43 y.o. male last seen December 12, 2018 for evaluation of chest pain.  History of hyperlipidemia GERD, obesity, status post bariatric surgery and kidney stones.  He had reported chest pain on and on for the previous few weeks prior to presentation.  Described it as tightness and burning in the center of chest.  He had a more subsequent severe episode which woke him from sleep with associated increased heart rate and presented to the emergency department.  He had a minimally elevated troponin which remained flat.  He had not had any further chest discomfort during that time.  He explained vague tightness in his chest when he walks across his yard.  Other history includes hyperlipidemia, GERD, depression, anxiety, kidney stones.  Past Medical History:  Diagnosis Date  . Anxiety   . Depression   . GERD (gastroesophageal reflux disease)   . Hyperlipidemia   . Kidney stones     Past Surgical History:  Procedure Laterality Date  . CHOLECYSTECTOMY    . vertical sleeve gastrectomy     2013 85% stomach removed was 280 lbs before surgery     Current Outpatient Medications  Medication Sig Dispense Refill  . aspirin EC 81 MG tablet Take 81 mg by mouth daily. Taking 5 days per week    . atorvastatin (LIPITOR) 20 MG tablet Take 1 tablet (20 mg total) by mouth daily. 30 tablet 11  . cyclobenzaprine (FLEXERIL) 10 MG tablet Take 10 mg by mouth 3 (three) times daily as needed for muscle spasms.    Marland Kitchen esomeprazole (NEXIUM) 40 MG capsule Take 1 capsule (40 mg total) by mouth daily. 30 minutes before food 90 capsule 3  . fluticasone (FLONASE) 50 MCG/ACT nasal spray Place into both nostrils daily.    Marland Kitchen L-Arginine 500  MG CAPS Take 1,000 mg by mouth daily.     Marland Kitchen LORazepam (ATIVAN) 1 MG tablet Take 1 tablet (1 mg total) by mouth daily as needed for anxiety. 30 tablet 2  . metoprolol tartrate (LOPRESSOR) 25 MG tablet Take 0.5 tablets (12.5 mg total) by mouth 2 (two) times daily. 90 tablet 1  . nitroGLYCERIN (NITROSTAT) 0.4 MG SL tablet Place 1 tablet (0.4 mg total) under the tongue every 5 (five) minutes as needed for chest pain. 25 tablet prn   No current facility-administered medications for this visit.   Allergies:  Patient has no known allergies.   Social History: The patient  reports that he has never smoked. He has never used smokeless tobacco. He reports current alcohol use. He reports that he does not use drugs.   Family History: The patient's family history includes Diabetes in his father; Heart attack (age of onset: 82) in his brother; Heart attack (age of onset: 11) in his father; Heart disease in his brother and father; Hyperlipidemia in his father; Hypertension in his father.   ROS:  Please see the history of present illness. Otherwise, complete review of systems is positive for none.  All other systems are reviewed and negative.   Physical Exam: VS:  There were no vitals taken for this visit., BMI There is no height or weight on file to calculate BMI.  Wt Readings  from Last 3 Encounters:  12/12/18 211 lb 8 oz (95.9 kg)  12/11/18 213 lb (96.6 kg)  12/08/18 200 lb (90.7 kg)    General: Patient appears comfortable at rest. HEENT: Conjunctiva and lids normal, oropharynx clear with moist mucosa. Neck: Supple, no elevated JVP or carotid bruits, no thyromegaly. Lungs: Clear to auscultation, nonlabored breathing at rest. Cardiac: Regular rate and rhythm, no S3 or significant systolic murmur, no pericardial rub. Abdomen: Soft, nontender, no hepatomegaly, bowel sounds present, no guarding or rebound. Extremities: No pitting edema, distal pulses 2+. Skin: Warm and dry. Musculoskeletal: No  kyphosis. Neuropsychiatric: Alert and oriented x3, affect grossly appropriate.  ECG:  An ECG dated 12/16/2018 was personally reviewed today and demonstrated:  Sinus rhythm with sinus arrhythmia rate of 70  Recent Labwork: 12/08/2018: ALT 20; AST 20; BUN 12; Creatinine, Ser 1.00; Hemoglobin 15.1; Platelets 200; Potassium 3.9; Sodium 141     Component Value Date/Time   CHOL 218 (H) 06/13/2017 0817   TRIG 173.0 (H) 06/13/2017 0817   HDL 42.80 06/13/2017 0817   CHOLHDL 5 06/13/2017 0817   VLDL 34.6 06/13/2017 0817   LDLCALC 140 (H) 06/13/2017 0817    Other Studies Reviewed Today:    Baseline EKG demonstrates normal sinus rhythm without significant abnormalities.  The patient demonstrated good exercise capacity with normal heart rate and blood pressure responses. The patient did not experience chest pain during the test.  There were 2 mm horizontal ST segment depressions in V4-6. No T wave inversion was seen.  There were no significant arrhythmias during stress or recovery.  Intermediate risk exercise tolerance test (Duke Treadmill Score = -1).   Intermediate risk exercise tolerance test with 2 mm ST depression in V4-6 during peak stress without associated angina.   Assessment and Plan:  No diagnosis found.   Medication Adjustments/Labs and Tests Ordered: Current medicines are reviewed at length with the patient today.  Concerns regarding medicines are outlined above.    There are no Patient Instructions on file for this visit.       Signed, Levell July, NP 02/04/2019 7:49 AM    [facility] [place], [place], [place] [address] Phone: [phone]; Fax: [phone]

## 2019-02-04 NOTE — Progress Notes (Signed)
Cardiology Clinic Note   Patient Name: Marcus Roberts Date of Encounter: 02/04/2019  Primary Care Provider:  McLean-Scocuzza, Nino Glow, MD Primary Cardiologist:  Nelva Bush, MD  Patient Profile    43 y/o ? with a history of hyperlipidemia, family history of premature CAD, GERD, obesity status post gastric sleeve, and nephrolithiasis, who presents for follow-up related to chest pain and abnormal coronary CTA.  Past Medical History    Past Medical History:  Diagnosis Date  . Anxiety   . CAD (coronary artery disease)    a. 12/2018 ETT: Ex time 9:39. 64mm horiz ST dep in V4-V6; b. 01/2019 Cor CTA: Ca2+ = 923 (99th%'ile). LM nl, LAD 50-69p, >70p/m. D1/2 small, LCX nondom 25-49, RCA dom, mild plaque p/m, >70d. Ao Atherosclerosis.  . Chest pain   . Depression   . GERD (gastroesophageal reflux disease)   . History of echocardiogram    a. 12/2018 Echo: EF 60-65%, no rwma. Mildly dil LA.  Marland Kitchen Hyperlipidemia   . Kidney stones    Past Surgical History:  Procedure Laterality Date  . CHOLECYSTECTOMY    . vertical sleeve gastrectomy     2013 85% stomach removed was 280 lbs before surgery     Allergies  No Known Allergies  History of Present Illness    43 year old male with the above past medical history including hyperlipidemia, family history of premature CAD, GERD, obesity status post gastric sleeve, and nephrolithiasis.  He was evaluated by Dr. Saunders Revel in November in the setting of a several week history of intermittent chest tightness and burning.  Initially underwent exercise treadmill testing in November which was notable for good exercise tolerance, walking 9: 39 however, he developed 2 mm horizontal ST segment depression in leads V4 through V6.  In light of ST segment changes, the study was felt to be of intermediate risk and he was referred for coronary CT angiography.  This was just performed on December 28 and he was found to have a coronary calcium score of 923, placing him in the  99th percentile for his age/sex.  Further, he was noted to have greater than 70% proximal and mid stenosis within the LAD and also in the distal RCA.  In this setting, follow-up was arranged to discuss diagnostic catheterization.  Since his last visit, he notes some amount of either chest discomfort or dyspnea on a daily basis.  Episodes of chest discomfort occur with both exertion and rest without any particular predictable pattern.  There are some times where he notes dyspnea and chest discomfort when walking across the room and others where he can walk 2 miles without any symptoms or limitations.  He denies palpitations, PND, orthopnea, dizziness, syncope, edema, or early satiety.  We discussed test results and recommendation for diagnostic catheterization at length today and he is interested in proceeding.  Home Medications    Prior to Admission medications   Medication Sig Start Date End Date Taking? Authorizing Provider  aspirin EC 81 MG tablet Take 81 mg by mouth daily. Taking 5 days per week    [provider]  atorvastatin (LIPITOR) 20 MG tablet Take 1 tablet (20 mg total) by mouth daily. 01/06/19 01/06/20  End, Harrell Gave, MD  cyclobenzaprine (FLEXERIL) 10 MG tablet Take 10 mg by mouth 3 (three) times daily as needed for muscle spasms.    [provider]  esomeprazole (NEXIUM) 40 MG capsule Take 1 capsule (40 mg total) by mouth daily. 30 minutes before food 12/12/18  McLean-Scocuzza, Nino Glow, MD  fluticasone (FLONASE) 50 MCG/ACT nasal spray Place into both nostrils daily.    [provider]  L-Arginine 500 MG CAPS Take 1,000 mg by mouth daily.     [provider]  LORazepam (ATIVAN) 1 MG tablet Take 1 tablet (1 mg total) by mouth daily as needed for anxiety. 01/28/19   McLean-Scocuzza, Nino Glow, MD  metoprolol tartrate (LOPRESSOR) 25 MG tablet Take 0.5 tablets (12.5 mg total) by mouth 2 (two) times daily. 12/22/18 03/22/19  End, Harrell Gave, MD    nitroGLYCERIN (NITROSTAT) 0.4 MG SL tablet Place 1 tablet (0.4 mg total) under the tongue every 5 (five) minutes as needed for chest pain. 02/02/19   End, Harrell Gave, MD    Family History    Family History  Problem Relation Age of Onset  . Diabetes Father   . Heart disease Father        x2  . Hyperlipidemia Father   . Hypertension Father   . Heart attack Father 18       x2  . Heart disease Brother        MI in 25s  . Heart attack Brother 68   He indicated that his mother is alive. He indicated that his father is alive. He indicated that his brother is alive.  Social History    Social History   Socioeconomic History  . Marital status: Married    Spouse name: Not on file  . Number of children: Not on file  . Years of education: Not on file  . Highest education level: Not on file  Occupational History  . Occupation: medical coding     Comment: [employer]  Tobacco Use  . Smoking status: Never Smoker  . Smokeless tobacco: Never Used  Substance and Sexual Activity  . Alcohol use: Yes    Comment: occas. 2-3 x per year   . Drug use: Never  . Sexual activity: Yes    Partners: Female  Other Topics Concern  . Not on file  Social History Narrative   Moved from [place]    Married    Some college    Careers information officer for [employer]   Owns work, wears seat belt, safe in relationship    Social Determinants of Health   Financial Resource Strain:   . Difficulty of Paying Living Expenses: Not on file  Food Insecurity:   . Worried About Charity fundraiser in the Last Year: Not on file  . Ran Out of Food in the Last Year: Not on file  Transportation Needs:   . Lack of Transportation (Medical): Not on file  . Lack of Transportation (Non-Medical): Not on file  Physical Activity:   . Days of Exercise per Week: Not on file  . Minutes of Exercise per Session: Not on file  Stress:   . Feeling of Stress : Not on file  Social Connections:   . Frequency of Communication with Friends and  Family: Not on file  . Frequency of Social Gatherings with Friends and Family: Not on file  . Attends Religious Services: Not on file  . Active Member of Clubs or Organizations: Not on file  . Attends Archivist Meetings: Not on file  . Marital Status: Not on file  Intimate Partner Violence:   . Fear of Current or Ex-Partner: Not on file  . Emotionally Abused: Not on file  . Physically Abused: Not on file  . Sexually Abused: Not on file  Review of Systems    General:  No chills, fever, night sweats or weight changes.  Cardiovascular: +++ Rest and exertional chest pain, +++ periodic rest and exertional dyspnea, no edema, orthopnea, palpitations, paroxysmal nocturnal dyspnea. Dermatological: No rash, lesions/masses Respiratory: No cough, +++ dyspnea Urologic: No hematuria, dysuria Abdominal:   No nausea, vomiting, diarrhea, bright red blood per rectum, melena, or hematemesis Neurologic:  No visual changes, wkns, changes in mental status. All other systems reviewed and are otherwise negative except as noted above.  Physical Exam    VS:  BP 120/80 (BP Location: Left Arm, Patient Position: Sitting, Cuff Size: Normal)   Pulse 73   Temp 98.2 F (36.8 C)   Ht 5\' 6"  (1.676 m)   Wt 215 lb 4 oz (97.6 kg)   BMI 34.74 kg/m  , BMI Body mass index is 34.74 kg/m. GEN: Well nourished, well developed, in no acute distress. HEENT: normal. Neck: Supple, no JVD, carotid bruits, or masses. Cardiac: RRR, no murmurs, rubs, or gallops. No clubbing, cyanosis, edema.  Radials/DP/PT 2+ and equal bilaterally.  Abnormal Allen's test on the right.  Normal Allen's on the left. Respiratory:  Respirations regular and unlabored, clear to auscultation bilaterally. GI: Soft, nontender, nondistended, BS + x 4. MS: no deformity or atrophy. Skin: warm and dry, no rash. Neuro:  Strength and sensation are intact. Psych: Normal affect.  Accessory Clinical Findings    ECG personally reviewed by me  today-regular sinus rhythm, 73 - No acute changes  Lab Results  Component Value Date   WBC 6.6 12/08/2018   HGB 15.1 12/08/2018   HCT 43.7 12/08/2018   MCV 89.0 12/08/2018   PLT 200 12/08/2018   Lab Results  Component Value Date   CREATININE 1.00 12/08/2018   BUN 12 12/08/2018   NA 141 12/08/2018   K 3.9 12/08/2018   CL 103 12/08/2018   CO2 27 12/08/2018   Lab Results  Component Value Date   ALT 20 12/08/2018   AST 20 12/08/2018   ALKPHOS 56 12/08/2018   BILITOT 0.9 12/08/2018   Lab Results  Component Value Date   CHOL 218 (H) 06/13/2017   HDL 42.80 06/13/2017   LDLCALC 140 (H) 06/13/2017   TRIG 173.0 (H) 06/13/2017   CHOLHDL 5 06/13/2017     Assessment & Plan   1.  Unstable angina/coronary artery disease: Patient with greater than 1 month history of rest and exertional substernal chest discomfort along with intermittent dyspnea.  Somewhat unpredictable nature of symptoms however, treadmill testing in November was notable for 2 mm horizontal ST segment depression in leads V4 through V6, followed by coronary CT angiography with greater than 70% proximal and mid LAD stenosis as well as distal RCA stenosis.  Further, coronary calcium score was markedly elevated at 923, placing him in the 99th percentile for age and sex.  Since his last office visit, he has continued to have intermittent symptoms.  We discussed test results at length along with plan for diagnostic catheterization.  The patient understands that risks include but are not limited to stroke (1 in 1000), death (1 in 40), kidney failure [usually temporary] (1 in 500), bleeding (1 in 200), allergic reaction [possibly serious] (1 in 200), and agrees to proceed.  I will obtain CBC and basic metabolic panel today and he will undergo Covid testing next week with plan for cath plus/minus PCI on Friday, January 8 with Dr. Saunders Revel in [place].  He remains on aspirin and beta-blocker therapy.  Given  findings on CT, I am going to  escalate statin therapy to atorvastatin 80 mg daily.  Further, given almost daily symptoms, I am adding low-dose long-acting nitrate therapy.  2.  Hyperlipidemia: LDL was 140 May 2019.  He was placed on atorvastatin 20 mg recently.  In light of abnormal coronary CT, I am escalating dose to 80 mg daily with a goal LDL less than 70.  Plan to follow-up lipids and LFTs in approximately 4 to 6 weeks.  3.  Obesity: Status post prior gastric sleeve.  He did ask which diet we would prefer as he is considering a keto diet.  I recommended that he consider a Mediterranean diet and a program such as weight watchers that will help him in learning appropriate portion sizes.  4.  Disposition: CBC and basic metabolic panel today.  Patient will have Covid testing next week with plan for diagnostic catheterization on January 8.  Plan to follow-up in clinic within the next 2 to 3 weeks.  Murray Hodgkins, NP 02/04/2019, 12:49 PM

## 2019-02-05 LAB — CBC
Hematocrit: 42.8 % (ref 37.5–51.0)
Hemoglobin: 15.1 g/dL (ref 13.0–17.7)
MCH: 31.1 pg (ref 26.6–33.0)
MCHC: 35.3 g/dL (ref 31.5–35.7)
MCV: 88 fL (ref 79–97)
Platelets: 197 10*3/uL (ref 150–450)
RBC: 4.85 x10E6/uL (ref 4.14–5.80)
RDW: 12.9 % (ref 11.6–15.4)
WBC: 7.1 10*3/uL (ref 3.4–10.8)

## 2019-02-05 LAB — BASIC METABOLIC PANEL
BUN/Creatinine Ratio: 18 (ref 9–20)
BUN: 16 mg/dL (ref 6–24)
CO2: 24 mmol/L (ref 20–29)
Calcium: 9.6 mg/dL (ref 8.7–10.2)
Chloride: 103 mmol/L (ref 96–106)
Creatinine, Ser: 0.87 mg/dL (ref 0.76–1.27)
GFR calc Af Amer: 124 mL/min/{1.73_m2} (ref >59)
GFR calc non Af Amer: 107 mL/min/{1.73_m2} (ref >59)
Glucose: 114 mg/dL — ABNORMAL HIGH (ref 65–99)
Potassium: 4.4 mmol/L (ref 3.5–5.2)
Sodium: 141 mmol/L (ref 134–144)

## 2019-02-10 ENCOUNTER — Other Ambulatory Visit: Payer: Self-pay

## 2019-02-10 ENCOUNTER — Other Ambulatory Visit
Admission: RE | Admit: 2019-02-10 | Discharge: 2019-02-10 | Disposition: A | Discharge status: Home / Self Care | Payer: 59 | Source: Ambulatory Visit | Attending: Internal Medicine | Admitting: Internal Medicine

## 2019-02-10 DIAGNOSIS — Z01812 Encounter for preprocedural laboratory examination: Secondary | ICD-10-CM | POA: Diagnosis not present

## 2019-02-10 DIAGNOSIS — Z20822 Contact with and (suspected) exposure to covid-19: Secondary | ICD-10-CM | POA: Insufficient documentation

## 2019-02-11 LAB — SARS CORONAVIRUS 2 (TAT 6-24 HRS): SARS Coronavirus 2: NEGATIVE

## 2019-02-12 ENCOUNTER — Ambulatory Visit: Payer: 59 | Admitting: Family

## 2019-02-12 ENCOUNTER — Telehealth: Payer: Self-pay | Admitting: *Deleted

## 2019-02-12 NOTE — Telephone Encounter (Signed)
Pt contacted pre-catheterization scheduled at [facility] for: Friday February 13, 2019 7:30 AM Verified arrival time and place: [facility]) at: 5:30 AM   No solid food after midnight prior to cath, clear liquids until 5 AM day of procedure. Contrast allergy: no  AM meds can be  taken pre-cath with sip of water including: ASA 81 mg   Confirmed patient has responsible adult to drive home post procedure and observe 24 hours after arriving home: yes  Currently, due to Covid-19 pandemic, only one support person will be allowed with patient. Must be the same support person for that patient's entire stay, will be screened and required to wear a mask. They will be asked to wait in the waiting room for the duration of the patient's stay.  Patients are required to wear a mask when they enter the hospital.      COVID-19 Pre-Screening Questions:  . In the past 7 to 10 days have you had a cough,  shortness of breath, headache, congestion, fever (100 or greater) body aches, chills, sore throat, or sudden loss of taste or sense of smell? no . Have you been around anyone with known Covid 19? no . Have you been around anyone who is awaiting Covid 19 test results in the past 7 to 10 days? no . Have you been around anyone who has been exposed to Covid 19, or has mentioned symptoms of Covid 19 within the past 7 to 10 days? No   I reviewed procedure/mask/visitor instructions, Covid-19 screening questions with patient, he verbalized understanding, thanked me for call.

## 2019-02-13 ENCOUNTER — Other Ambulatory Visit: Payer: Self-pay

## 2019-02-13 ENCOUNTER — Encounter (HOSPITAL_COMMUNITY)
Admission: RE | Disposition: A | Discharge status: Home / Self Care | Payer: Self-pay | Source: Home / Self Care | Attending: Internal Medicine

## 2019-02-13 ENCOUNTER — Ambulatory Visit (HOSPITAL_COMMUNITY)
Admission: RE | Admit: 2019-02-13 | Discharge: 2019-02-14 | Disposition: A | Discharge status: Home / Self Care | Payer: 59 | Attending: Internal Medicine | Admitting: Internal Medicine

## 2019-02-13 DIAGNOSIS — F419 Anxiety disorder, unspecified: Secondary | ICD-10-CM | POA: Insufficient documentation

## 2019-02-13 DIAGNOSIS — Z79899 Other long term (current) drug therapy: Secondary | ICD-10-CM | POA: Insufficient documentation

## 2019-02-13 DIAGNOSIS — I200 Unstable angina: Secondary | ICD-10-CM

## 2019-02-13 DIAGNOSIS — Z6834 Body mass index (BMI) 34.0-34.9, adult: Secondary | ICD-10-CM | POA: Diagnosis not present

## 2019-02-13 DIAGNOSIS — E669 Obesity, unspecified: Secondary | ICD-10-CM | POA: Diagnosis present

## 2019-02-13 DIAGNOSIS — Z9884 Bariatric surgery status: Secondary | ICD-10-CM | POA: Diagnosis not present

## 2019-02-13 DIAGNOSIS — E785 Hyperlipidemia, unspecified: Secondary | ICD-10-CM | POA: Diagnosis not present

## 2019-02-13 DIAGNOSIS — I25118 Atherosclerotic heart disease of native coronary artery with other forms of angina pectoris: Secondary | ICD-10-CM | POA: Diagnosis not present

## 2019-02-13 DIAGNOSIS — I25119 Atherosclerotic heart disease of native coronary artery with unspecified angina pectoris: Secondary | ICD-10-CM | POA: Diagnosis present

## 2019-02-13 DIAGNOSIS — Z8249 Family history of ischemic heart disease and other diseases of the circulatory system: Secondary | ICD-10-CM | POA: Diagnosis not present

## 2019-02-13 DIAGNOSIS — E66811 Obesity, class 1: Secondary | ICD-10-CM | POA: Diagnosis present

## 2019-02-13 DIAGNOSIS — I25110 Atherosclerotic heart disease of native coronary artery with unstable angina pectoris: Secondary | ICD-10-CM | POA: Diagnosis not present

## 2019-02-13 DIAGNOSIS — Z7982 Long term (current) use of aspirin: Secondary | ICD-10-CM | POA: Insufficient documentation

## 2019-02-13 DIAGNOSIS — F329 Major depressive disorder, single episode, unspecified: Secondary | ICD-10-CM | POA: Insufficient documentation

## 2019-02-13 DIAGNOSIS — I2582 Chronic total occlusion of coronary artery: Secondary | ICD-10-CM | POA: Insufficient documentation

## 2019-02-13 DIAGNOSIS — K219 Gastro-esophageal reflux disease without esophagitis: Secondary | ICD-10-CM | POA: Diagnosis not present

## 2019-02-13 DIAGNOSIS — Z955 Presence of coronary angioplasty implant and graft: Secondary | ICD-10-CM

## 2019-02-13 HISTORY — PX: INTRAVASCULAR ULTRASOUND/IVUS: CATH118244

## 2019-02-13 HISTORY — PX: CORONARY ATHERECTOMY: CATH118238

## 2019-02-13 HISTORY — DX: Atherosclerosis of aorta: I70.0

## 2019-02-13 HISTORY — PX: INTRAVASCULAR PRESSURE WIRE/FFR STUDY: CATH118243

## 2019-02-13 HISTORY — PX: CORONARY STENT INTERVENTION: CATH118234

## 2019-02-13 HISTORY — PX: LEFT HEART CATH AND CORONARY ANGIOGRAPHY: CATH118249

## 2019-02-13 LAB — POCT ACTIVATED CLOTTING TIME
Activated Clotting Time: 285 seconds
Activated Clotting Time: 279 seconds
Activated Clotting Time: 307 seconds
Activated Clotting Time: 268 seconds

## 2019-02-13 SURGERY — LEFT HEART CATH AND CORONARY ANGIOGRAPHY
Anesthesia: LOCAL

## 2019-02-13 MED ORDER — SODIUM CHLORIDE 0.9 % IV SOLN: INTRAVENOUS | Status: AC

## 2019-02-13 MED ORDER — FLUTICASONE PROPIONATE 50 MCG/ACT NA SUSP
1.0000 | Freq: Every evening | NASAL | Status: DC
  Administered 2019-02-13: 1 via NASAL
  Filled 2019-02-13: qty 16

## 2019-02-13 MED ORDER — HEPARIN SODIUM (PORCINE) 1000 UNIT/ML IJ SOLN
INTRAMUSCULAR | Status: AC
  Filled 2019-02-13: qty 1

## 2019-02-13 MED ORDER — ADENOSINE (DIAGNOSTIC) 140MCG/KG/MIN
INTRAVENOUS | Status: DC | PRN
  Administered 2019-02-13: 140 ug/kg/min via INTRAVENOUS

## 2019-02-13 MED ORDER — MIDAZOLAM HCL 2 MG/2ML IJ SOLN
INTRAMUSCULAR | Status: AC
  Filled 2019-02-13: qty 2

## 2019-02-13 MED ORDER — HEPARIN (PORCINE) IN NACL 1000-0.9 UT/500ML-% IV SOLN
INTRAVENOUS | Status: AC
  Filled 2019-02-13: qty 1000

## 2019-02-13 MED ORDER — SODIUM CHLORIDE 0.9% FLUSH
3.0000 mL | Freq: Two times a day (BID) | INTRAVENOUS | Status: DC
  Administered 2019-02-13 (×2): 3 mL via INTRAVENOUS

## 2019-02-13 MED ORDER — VERAPAMIL HCL 2.5 MG/ML IV SOLN
INTRAVENOUS | Status: DC | PRN
  Administered 2019-02-13: 10 mL via INTRA_ARTERIAL

## 2019-02-13 MED ORDER — NITROGLYCERIN 1 MG/10 ML FOR IR/CATH LAB
INTRA_ARTERIAL | Status: DC | PRN
  Administered 2019-02-13 (×4): 200 ug via INTRACORONARY

## 2019-02-13 MED ORDER — ASPIRIN 81 MG PO CHEW: 81.0000 mg | CHEWABLE_TABLET | ORAL | Status: DC

## 2019-02-13 MED ORDER — SODIUM CHLORIDE 0.9 % WEIGHT BASED INFUSION: 1.0000 mL/kg/h | INTRAVENOUS | Status: DC

## 2019-02-13 MED ORDER — LIDOCAINE HCL (PF) 1 % IJ SOLN
INTRAMUSCULAR | Status: DC | PRN
  Administered 2019-02-13: 2 mL

## 2019-02-13 MED ORDER — HEPARIN SODIUM (PORCINE) 1000 UNIT/ML IJ SOLN
INTRAMUSCULAR | Status: DC | PRN
  Administered 2019-02-13: 5000 [IU] via INTRAVENOUS
  Administered 2019-02-13: 2000 [IU] via INTRAVENOUS
  Administered 2019-02-13: 3000 [IU] via INTRAVENOUS
  Administered 2019-02-13: 2000 [IU] via INTRAVENOUS
  Administered 2019-02-13: 5000 [IU] via INTRAVENOUS

## 2019-02-13 MED ORDER — NITROGLYCERIN 1 MG/10 ML FOR IR/CATH LAB
INTRA_ARTERIAL | Status: AC
  Filled 2019-02-13: qty 10

## 2019-02-13 MED ORDER — LABETALOL HCL 5 MG/ML IV SOLN: 10.0000 mg | INTRAVENOUS | Status: AC | PRN

## 2019-02-13 MED ORDER — IOHEXOL 350 MG/ML SOLN
INTRAVENOUS | Status: DC | PRN
  Administered 2019-02-13: 150 mL via INTRA_ARTERIAL

## 2019-02-13 MED ORDER — SODIUM CHLORIDE 0.9 % IV SOLN: 250.0000 mL | INTRAVENOUS | Status: DC | PRN

## 2019-02-13 MED ORDER — LIDOCAINE HCL (PF) 1 % IJ SOLN
INTRAMUSCULAR | Status: AC
  Filled 2019-02-13: qty 30

## 2019-02-13 MED ORDER — LORAZEPAM 0.5 MG PO TABS
0.5000 mg | ORAL_TABLET | Freq: Two times a day (BID) | ORAL | Status: DC | PRN
  Administered 2019-02-13: 0.5 mg via ORAL
  Filled 2019-02-13: qty 1

## 2019-02-13 MED ORDER — FENTANYL CITRATE (PF) 100 MCG/2ML IJ SOLN
INTRAMUSCULAR | Status: AC
  Filled 2019-02-13: qty 2

## 2019-02-13 MED ORDER — SODIUM CHLORIDE 0.9% FLUSH: 3.0000 mL | Freq: Two times a day (BID) | INTRAVENOUS | Status: DC

## 2019-02-13 MED ORDER — ASPIRIN EC 81 MG PO TBEC
81.0000 mg | DELAYED_RELEASE_TABLET | Freq: Every day | ORAL | Status: DC
  Administered 2019-02-14: 81 mg via ORAL
  Filled 2019-02-13: qty 1

## 2019-02-13 MED ORDER — SODIUM CHLORIDE 0.9% FLUSH: 3.0000 mL | INTRAVENOUS | Status: DC | PRN

## 2019-02-13 MED ORDER — SODIUM CHLORIDE 0.9 % WEIGHT BASED INFUSION
3.0000 mL/kg/h | INTRAVENOUS | Status: DC
  Administered 2019-02-13: 3 mL/kg/h via INTRAVENOUS

## 2019-02-13 MED ORDER — VERAPAMIL HCL 2.5 MG/ML IV SOLN
INTRAVENOUS | Status: AC
  Filled 2019-02-13: qty 2

## 2019-02-13 MED ORDER — TICAGRELOR 90 MG PO TABS
90.0000 mg | ORAL_TABLET | Freq: Two times a day (BID) | ORAL | Status: DC
  Administered 2019-02-13 – 2019-02-14 (×2): 90 mg via ORAL
  Filled 2019-02-13 (×2): qty 1

## 2019-02-13 MED ORDER — METOPROLOL TARTRATE 12.5 MG HALF TABLET
12.5000 mg | ORAL_TABLET | Freq: Two times a day (BID) | ORAL | Status: DC
  Administered 2019-02-13 – 2019-02-14 (×2): 12.5 mg via ORAL
  Filled 2019-02-13 (×2): qty 1

## 2019-02-13 MED ORDER — ATORVASTATIN CALCIUM 80 MG PO TABS
80.0000 mg | ORAL_TABLET | Freq: Every day | ORAL | Status: DC
  Administered 2019-02-13: 80 mg via ORAL
  Filled 2019-02-13: qty 1

## 2019-02-13 MED ORDER — HYDRALAZINE HCL 20 MG/ML IJ SOLN: 10.0000 mg | INTRAMUSCULAR | Status: AC | PRN

## 2019-02-13 MED ORDER — TICAGRELOR 90 MG PO TABS
ORAL_TABLET | ORAL | Status: AC
  Filled 2019-02-13: qty 2

## 2019-02-13 MED ORDER — ENOXAPARIN SODIUM 40 MG/0.4ML ~~LOC~~ SOLN: 40.0000 mg | SUBCUTANEOUS | Status: DC

## 2019-02-13 MED ORDER — FENTANYL CITRATE (PF) 100 MCG/2ML IJ SOLN
INTRAMUSCULAR | Status: DC | PRN
  Administered 2019-02-13 (×4): 25 ug via INTRAVENOUS
  Administered 2019-02-13: 50 ug via INTRAVENOUS

## 2019-02-13 MED ORDER — VIPERSLIDE LUBRICANT OPTIME: TOPICAL | Status: DC | PRN

## 2019-02-13 MED ORDER — NITROGLYCERIN 0.4 MG SL SUBL: 0.4000 mg | SUBLINGUAL_TABLET | SUBLINGUAL | Status: DC | PRN

## 2019-02-13 MED ORDER — ADENOSINE 12 MG/4ML IV SOLN
INTRAVENOUS | Status: AC
  Filled 2019-02-13: qty 16

## 2019-02-13 MED ORDER — MIDAZOLAM HCL 2 MG/2ML IJ SOLN
INTRAMUSCULAR | Status: DC | PRN
  Administered 2019-02-13 (×4): 1 mg via INTRAVENOUS

## 2019-02-13 MED ORDER — ACETAMINOPHEN 325 MG PO TABS
650.0000 mg | ORAL_TABLET | ORAL | Status: DC | PRN
  Administered 2019-02-13 (×2): 650 mg via ORAL
  Filled 2019-02-13 (×2): qty 2

## 2019-02-13 MED ORDER — HEPARIN (PORCINE) IN NACL 1000-0.9 UT/500ML-% IV SOLN
INTRAVENOUS | Status: AC
  Filled 2019-02-13: qty 500

## 2019-02-13 MED ORDER — TICAGRELOR 90 MG PO TABS
ORAL_TABLET | ORAL | Status: DC | PRN
  Administered 2019-02-13: 180 mg via ORAL

## 2019-02-13 MED ORDER — HEPARIN (PORCINE) IN NACL 1000-0.9 UT/500ML-% IV SOLN
INTRAVENOUS | Status: DC | PRN
  Administered 2019-02-13 (×3): 500 mL

## 2019-02-13 MED ORDER — ONDANSETRON HCL 4 MG/2ML IJ SOLN: 4.0000 mg | Freq: Four times a day (QID) | INTRAMUSCULAR | Status: DC | PRN

## 2019-02-13 MED ORDER — PANTOPRAZOLE SODIUM 40 MG PO TBEC
80.0000 mg | DELAYED_RELEASE_TABLET | Freq: Every day | ORAL | Status: DC
  Administered 2019-02-13: 80 mg via ORAL
  Filled 2019-02-13: qty 2

## 2019-02-13 SURGICAL SUPPLY — 33 items
BALLN MINITREK OTW 2.0X12 (BALLOONS) ×2
BALLN SAPPHIRE 2.75X20 (BALLOONS) ×2
BALLN SAPPHIRE ~~LOC~~ 2.5X12 (BALLOONS) ×2 IMPLANT
BALLN SAPPHIRE ~~LOC~~ 3.5X18 (BALLOONS) ×2 IMPLANT
BALLN SAPPHIRE ~~LOC~~ 4.0X12 (BALLOONS) ×2 IMPLANT
BALLN WOLVERINE 2.50X10 (BALLOONS) ×2
BALLOON MINITREK OTW 2.0X12 (BALLOONS) ×1 IMPLANT
BALLOON SAPPHIRE 2.75X20 (BALLOONS) ×1 IMPLANT
BALLOON WOLVERINE 2.50X10 (BALLOONS) ×1 IMPLANT
CATH 5FR JL3.5 JR4 ANG PIG MP (CATHETERS) ×2 IMPLANT
CATH LAUNCHER 5F JR4 (CATHETERS) ×2 IMPLANT
CATH OPTICROSS 40MHZ (CATHETERS) ×2 IMPLANT
CATH VISTA GUIDE 6FR XBLAD3.5 (CATHETERS) ×2 IMPLANT
CROWN DIAMONDBACK CLASSIC 1.25 (BURR) ×2 IMPLANT
DEVICE RAD COMP TR BAND LRG (VASCULAR PRODUCTS) ×2 IMPLANT
GLIDESHEATH SLEND A-KIT 6F 22G (SHEATH) ×2 IMPLANT
GUIDEWIRE INQWIRE 1.5J.035X260 (WIRE) ×1 IMPLANT
GUIDEWIRE PRESSURE COMET II (WIRE) ×2 IMPLANT
INQWIRE 1.5J .035X260CM (WIRE) ×2
KIT ENCORE 26 ADVANTAGE (KITS) ×2 IMPLANT
KIT ESSENTIALS PG (KITS) ×2 IMPLANT
KIT HEART LEFT (KITS) ×2 IMPLANT
KIT HEMO VALVE WATCHDOG (MISCELLANEOUS) ×2 IMPLANT
LUBRICANT VIPERSLIDE CORONARY (MISCELLANEOUS) ×2 IMPLANT
PACK CARDIAC CATHETERIZATION (CUSTOM PROCEDURE TRAY) ×2 IMPLANT
SLED PULL BACK IVUS (MISCELLANEOUS) ×2 IMPLANT
STENT SYNERGY XD 3.0X38 (Permanent Stent) ×1 IMPLANT
SYNERGY XD 3.0X38 (Permanent Stent) ×2 IMPLANT
TRANSDUCER W/STOPCOCK (MISCELLANEOUS) ×2 IMPLANT
TUBING CIL FLEX 10 FLL-RA (TUBING) ×2 IMPLANT
WIRE RUNTHROUGH .014X180CM (WIRE) ×2 IMPLANT
WIRE RUNTHROUGH .014X300CM (WIRE) ×2 IMPLANT
WIRE VIPERWIRE COR FLEX .012 (WIRE) ×2 IMPLANT

## 2019-02-13 NOTE — Progress Notes (Signed)
Patient complained of slight shortness of breath and chest feeling hot. Notified PA. Likely side affect from [place]. Regular coca-cola given. On reassessment patient states he feels better.

## 2019-02-13 NOTE — Interval H&P Note (Signed)
History and Physical Interval Note:  02/13/2019 7:07 AM  Marcus Roberts  has presented today for surgery, with the diagnosis of unstable angina.  The various methods of treatment have been discussed with the patient and family. After consideration of risks, benefits and other options for treatment, the patient has consented to  Procedure(s): LEFT HEART CATH AND CORONARY ANGIOGRAPHY (N/A) as a surgical intervention.  The patient's history has been reviewed, patient examined, no change in status, stable for surgery.  I have reviewed the patient's chart and labs.  Questions were answered to the patient's satisfaction.  Modified Allen's test performed on the right wrist in the prep area; this was normal.  Cath Lab Visit (complete for each Cath Lab visit)  Clinical Evaluation Leading to the Procedure:   ACS: No.  Non-ACS:    Anginal Classification: CCS III  Anti-ischemic medical therapy: Maximal Therapy (2 or more classes of medications)  Non-Invasive Test Results: Intermediate-risk stress test findings: cardiac mortality 1-3%/year  Prior CABG: No previous CABG  Nthony Lefferts

## 2019-02-14 ENCOUNTER — Encounter: Payer: Self-pay | Admitting: Physician Assistant

## 2019-02-14 ENCOUNTER — Encounter (HOSPITAL_COMMUNITY): Payer: Self-pay | Admitting: Internal Medicine

## 2019-02-14 DIAGNOSIS — I25119 Atherosclerotic heart disease of native coronary artery with unspecified angina pectoris: Secondary | ICD-10-CM | POA: Diagnosis not present

## 2019-02-14 DIAGNOSIS — E785 Hyperlipidemia, unspecified: Secondary | ICD-10-CM | POA: Diagnosis not present

## 2019-02-14 DIAGNOSIS — E669 Obesity, unspecified: Secondary | ICD-10-CM | POA: Diagnosis not present

## 2019-02-14 DIAGNOSIS — I2582 Chronic total occlusion of coronary artery: Secondary | ICD-10-CM | POA: Diagnosis not present

## 2019-02-14 DIAGNOSIS — I25110 Atherosclerotic heart disease of native coronary artery with unstable angina pectoris: Secondary | ICD-10-CM | POA: Diagnosis not present

## 2019-02-14 LAB — BASIC METABOLIC PANEL
Anion gap: 10 (ref 5–15)
BUN: 9 mg/dL (ref 6–20)
CO2: 25 mmol/L (ref 22–32)
Calcium: 9.1 mg/dL (ref 8.9–10.3)
Chloride: 106 mmol/L (ref 98–111)
Creatinine, Ser: 0.82 mg/dL (ref 0.61–1.24)
GFR calc Af Amer: >60 mL/min (ref >60)
GFR calc non Af Amer: >60 mL/min (ref >60)
Glucose, Bld: 99 mg/dL (ref 70–99)
Potassium: 3.6 mmol/L (ref 3.5–5.1)
Sodium: 141 mmol/L (ref 135–145)

## 2019-02-14 LAB — CBC
HCT: 41.8 % (ref 39.0–52.0)
Hemoglobin: 14.7 g/dL (ref 13.0–17.0)
MCH: 31.2 pg (ref 26.0–34.0)
MCHC: 35.2 g/dL (ref 30.0–36.0)
MCV: 88.7 fL (ref 80.0–100.0)
Platelets: 177 10*3/uL (ref 150–400)
RBC: 4.71 MIL/uL (ref 4.22–5.81)
RDW: 12.8 % (ref 11.5–15.5)
WBC: 8.6 10*3/uL (ref 4.0–10.5)
nRBC: 0 % (ref 0.0–0.2)

## 2019-02-14 MED ORDER — TICAGRELOR 90 MG PO TABS: 90.0000 mg | ORAL_TABLET | Freq: Two times a day (BID) | ORAL | 11 refills | Status: DC

## 2019-02-14 NOTE — Progress Notes (Addendum)
CARDIAC REHAB PHASE I   PRE:  Rate/Rhythm: 79  BP:   Sitting: 148/94     SaO2: 97% RA  MODE:  Ambulation: 600 ft   POST:  Rate/Rhythem: Sinus Rhythm 83  BP:    Sitting: 134/94     SaO2: 98%  [id number] Patient walked independently in the hallway without complaints or symptoms. Reviewed exercise instructions, temperature precautions. Patient interested in virtual cardiac rehab will refer to [facility].Discussed heart healthy diet. Use of sublingual nitroglycerin when to call 911. Patient has stent card. Patient given [employer] card. Harrell Gave RN

## 2019-02-14 NOTE — Care Management (Signed)
Per RN, patient given [employer] card by Cardiac Rehab RN.

## 2019-02-14 NOTE — Discharge Summary (Addendum)
Discharge Summary    Patient ID: LONG BRIMAGE MRN: [id number]; DOB: 08-17-1975  Admit date: 02/13/2019 Discharge date: 02/14/2019  Primary Care Provider: McLean-Scocuzza, Nino Glow, MD  Primary Cardiologist: Nelva Bush, MD  Primary Electrophysiologist:  None   Discharge Diagnoses    Principal Problem:   Coronary artery disease involving native coronary artery of native heart with angina pectoris [facility]) Active Problems:   HLD (hyperlipidemia)   Obesity (BMI 30.0-34.9)   Diagnostic Studies/Procedures    Cardiac Catheterization 02/13/19 Conclusions: 1. Significant three-vessel coronary artery disease, including sequential 60% proximal, 90% mid, and 40% distal LAD stenoses, chronic total occlusion of mid LCx with left to left collaterals, sequential 40 and 60% mid/distal RCA stenoses that are borderline hemodynamically significant by FFR, and chronic total occlusion of RCA continuation with left-to-right collaterals. 2. Normal left ventricular systolic function with mildly elevated filling pressure. 3. Successful IVUS guided orbital atherectomy and drug-eluting stent placement to the proximal and mid LAD using Synergy 3.0 x 38 mm drug-eluting stent with 0% residual stenosis and TIMI-3 flow.  Recommendations: 1. Overnight extended recovery. 2. Dual antiplatelet therapy with aspirin and ticagrelor for at least 6 months, ideally longer. 3. Aggressive secondary prevention, including high intensity statin therapy.  Technical Details Indication: 44 y.o. year-old man with history of hyperlipidemia, family history of premature CAD, GERD, obesity status post gastric sleeve, and nephrolithiasis, presenting for evaluation of several months of chest pain with minimal activity and abnormal exercise tolerance test and cardiac CTA suggesting significant two-vessel CAD.  GFR: >40 ml/min  Procedure: The risks, benefits, complications, treatment options, and expected outcomes were discussed with the  patient. The patient and/or family concurred with the proposed plan, giving informed consent. The patient was sedated with IV midazolam and fentanyl. The right wrist was assessed with a modified Allens test which was normal. The right wrist was prepped and draped in a sterile fashion. 1% lidocaine was used for local anesthesia. Using the modified Seldinger access technique, a 27F slender Glidesheath was placed in the right radial artery. 3 mg Verapamil was given through the sheath. Heparin 5,000 units were administered.  Selective coronary angiography was performed using 63F JL3.5 and JR4 catheters to engage the left and right coronary arteries, respectively. Left heart catheterization was performed using a 63F JR4 catheter. Left ventriculogram was performed with a hand injection of contrast.  FFR of RCA: Heparin was used for anticoagulation.  The right coronary artery was engaged with a 63F JR4 guide catheter and a Comet wire advanced into the rPDA after administration of intracoronary nitroglycerin.  DFR was 0.90.  Intravenous infusion of adenosine 140 mcg/kg/min was administered and FFR obtained at maximal hyperemia (FFR 0.80).  Final angiogram shows stable appearance of the right coronary artery.  PCI of LAD: Images were reviewed with Dr. Martinique.  Given borderline distal RCA disease and CT of RCA continuation of mid LCx with collateralization, we agreed to the atherectomy and PCI to the LAD would be the best intervention.  Potential for atherectomy had been discussed with Mr. Delatte prior to the procedure and he was in agreement with proceeding should atherectomy be necessary.  The left coronary artery was engaged with a 27F XBLAD3.5 guide catheter.  Heparin was continued for anticoagulation and the patient loaded with ticagrelor.  A Runthrough wire was advanced into the distal LAD and was then exchanged for a Viper Flex wire using a Sapphire 2.0 x 12 mm OTW balloon.  Orbital atherectomy of the proximal and  mid LAD  was performed using a CSI Diamondback 1.25 mm crown.  Four passes were preformed at 80,000 rpm.  The OTW balloon was used to exchange for the Runthrough wire again.  Intravascular ultrasound was performed with an OptiCross catheter, showing proximal and distal reference vessel sizes of ~4.0 and 3.0 mm, respectively.  The culprit lesion in the mid LAD showed heavy circumferential calcification.  The mid LAD was predilated using a Sapphire 2.75 x 20 mm balloon at 10 atm.  Significant waist was evident.  Therefore, further predilation was performed using a Wolverine 2.5 x 10 mm cutting balloon at 12 atm followed by an [place] Sapphire 2.5 x 12 mm balloon at 16-18 atm.  At this point, the lesion yielded with good balloon expansion.  The proximal and mid LAD disease was covered with a single Synergy 3.0 x 38 mm drug-eluting stent, which was deployed at 11 atm and postdilated with the same balloon at 16 atm.  IVUS showed good stent apposition but incomplete expansion.  The stent was postdilated using an [place] Sapphire 3.5 x 18 mm balloon at 16 atm.  The proximal portion was further postdilated using an  Sapphire 4.0 x 12 mm balloon at 12 atm.  Jailed D2 branch was jailed with increased stenosis following PCI (90 -> 99% with TIMI-1 flow).  Given small size of D2 (<2 mm) and lack of chest pain, intervention was not attempted on this branch.  Final angiogram shows 0% residual stenosis with TIMI-3 flow throughout the LAD.  At the end of the procedure, the radial artery sheath was removed and a TR band applied to achieve patent hemostasis. There were no immediate complications. The patient was taken to the recovery area in stable condition.  Estimated blood loss <50 mL.   During this procedure medications were administered to achieve and maintain moderate conscious sedation while the patient's heart rate, blood pressure, and oxygen saturation were continuously monitored and I was present face-to-face 100% of this time.    Nelva Bush, MD [facility]    _____________   History of Present Illness     Marcus Roberts is a 44 y.o. male with hyperlipidemia, family history of premature CAD, anxiety/depression, GERD, obesity status post gastric sleeve, and nephrolithiasis who presented to [facility] for planned catheterization. He was recently evaluated in November for several week history of intermittent chest tightness. He initially underwent exercise treadmill testing in November which was notable for good exercise tolerance, walking 9:39 however, he developed 2 mm horizontal ST segment depression in leads V4 through V6.  In light of ST segment changes, the study was felt to be of intermediate risk and he was referred for coronary CT angiography.  This was just performed on February 02, 2019 and he was found to have a coronary calcium score of 923, placing him in the 99th percentile for his age/sex.  Further, he was noted to have greater than 70% proximal and mid stenosis within the LAD and also in the distal RCA.  In this setting, follow-up was arranged to discuss diagnostic catheterization. Imdur was added as outpatient and atorvastat was titrated. 2D Echo 12/26/18 showed EF 60-65%, normal RV, mildly dilated LA.  Hospital Course     He underwent catheterization with findings above with 3-vessel coronary atherosclerotic CAD. Dr. Saunders Revel reviewed with Dr. Martinique. Given borderline distal RCA disease and CT of RCA continuation of mid LCx with collateralization, they decided atherectomy and PCI to the LAD would be the best intervention. He underwent successful  IVUS guided orbital atherectomy and drug-eluting stent placement to the proximal and mid LAD using Synergy 3.0 x 38 mm drug-eluting stent with 0% residual stenosis and TIMI-3 flow. He was started on Brilinta with recommendation for dual antiplatelet therapy with aspirin and ticagrelor for at least 6 months, ideally longer. If the patient is tolerating statin at time  of follow-up appointment, would recommend arrangement of f/u liver/lipids. Dr. Lovena Le has seen and examined the patient today and feels he is stable for discharge. He was supplied handwritten 30 day free Brilinta card and regular rx sent into pharmacy. Of note, Imdur was discontinued due to side effect of headache so this was not continued at discharge. 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 Per our discussion he can return to work on 02/18/19 (work note written) but with permission that if he feels fine by 02/16/19 he may return as soon as then which was cleared by Dr. Lovena Le.  Did the patient have an acute coronary syndrome (MI, NSTEMI, STEMI, etc) this admission?:  No                               Did the patient have a percutaneous coronary intervention (stent / angioplasty)?:  Yes.     Cath/PCI Registry Performance & Quality Measures: 4. Aspirin prescribed? - Yes 5. ADP Receptor Inhibitor (Plavix/Clopidogrel, Brilinta/Ticagrelor or Effient/Prasugrel) prescribed (includes medically managed patients)? - Yes 6. High Intensity Statin (Lipitor 40-2m or Crestor 20-410m prescribed? - Yes 7. For EF <40%, was ACEI/ARB prescribed? - Not Applicable (EF >/= 4055%8. For EF <40%, Aldosterone Antagonist (Spironolactone or Eplerenone) prescribed? - Not Applicable (EF >/= 4073%9. Cardiac Rehab Phase II ordered (Included Medically managed Patients)? - Yes   _____________  Vital Signs. BP (!) 134/94   Pulse 76   Temp 98.2 F (36.8 C) (Oral)   Resp 17   Ht 5' 6"  (1.676 m)   Wt 97 kg   SpO2 99%   BMI 34.52 kg/m  General: Well developed, well nourished M in no acute distress. Head: Normocephalic, atraumatic, sclera non-icteric, no xanthomas, nares are without discharge. Neck: Negative for carotid bruits. JVP not elevated. Lungs: Clear bilaterally to auscultation without wheezes, rales, or rhonchi. Breathing is unlabored. Heart: RRR S1 S2 without murmurs, rubs, or gallops.  Abdomen: Soft,  non-tender, non-distended with normoactive bowel sounds. No rebound/guarding. Extremities: No clubbing or cyanosis. No edema. Distal pedal pulses are 2+ and equal bilaterally. Right radial cath site without hematoma or ecchymosis; good pulse. Neuro: Alert and oriented X 3. Moves all extremities spontaneously. Psych:  Responds to questions appropriately with a normal affect.  Labs & Radiologic Studies    CBC Recent Labs    02/14/19 0228  WBC 8.6  HGB 14.7  HCT 41.8  MCV 88.7  PLT 17220 Basic Metabolic Panel Recent Labs    02/14/19 0228  NA 141  K 3.6  CL 106  CO2 25  GLUCOSE 99  BUN 9  CREATININE 0.82  CALCIUM 9.1   ____________  CARDIAC CATHETERIZATION  Result Date: 02/13/2019 Conclusions: 1. Significant three-vessel coronary artery disease, including sequential 60% proximal, 90% mid, and 40% distal LAD stenoses, chronic total occlusion of mid LCx with left to left collaterals, sequential 40 and 60% mid/distal RCA stenoses that are borderline hemodynamically significant by FFR, and chronic total occlusion of RCA continuation with left-to-right collaterals. 2. Normal left ventricular systolic function with mildly elevated filling pressure.  3. Successful IVUS guided orbital atherectomy and drug-eluting stent placement to the proximal and mid LAD using Synergy 3.0 x 38 mm drug-eluting stent with 0% residual stenosis and TIMI-3 flow. Recommendations: 1. Overnight extended recovery. 2. Dual antiplatelet therapy with aspirin and ticagrelor for at least 6 months, ideally longer. 3. Aggressive secondary prevention, including high intensity statin therapy. Nelva Bush, MD [facility]   CT CORONARY [facility] W/CTA COR W/SCORE Lewanda Rife W/CM &/OR WO/CM  Addendum Date: 02/02/2019   ADDENDUM REPORT: 02/02/2019 15:13 EXAM: OVER-READ INTERPRETATION  CT CHEST The following report is an over-read performed by radiologist Dr. Rebekah Chesterfield [facility] on 02/02/2019. This over-read  does not include interpretation of cardiac or coronary anatomy or pathology. The coronary calcium score and cardiac CTA interpretation by the cardiologist is attached. COMPARISON:  None. FINDINGS: Aortic atherosclerosis. Tiny calcified granuloma in the right middle lobe incidentally noted. Within the visualized portions of the thorax there are no suspicious appearing pulmonary nodules or masses, there is no acute consolidative airspace disease, no pleural effusions, no pneumothorax and no lymphadenopathy. Visualized portions of the upper abdomen are unremarkable. There are no aggressive appearing lytic or blastic lesions noted in the visualized portions of the skeleton. IMPRESSION: 1.  Aortic Atherosclerosis (ICD10-I70.0). Electronically Signed   By: Vinnie Langton M.D.   On: 02/02/2019 15:13   Result Date: 02/02/2019 CLINICAL DATA:  44 year old male with hyperlipidemia, FH of early CAD and abnormal exercise treadmill stress test. EXAM: Cardiac/Coronary  CTA TECHNIQUE: The patient was scanned on a [employer]. FINDINGS: A 100 kV prospective scan was triggered in the descending thoracic aorta at 111 HU's. Axial non-contrast 3 mm slices were carried out through the heart. The data set was analyzed on a dedicated work station and scored using the [place]. Gantry rotation speed was 250 msecs and collimation was .6 mm. No beta blockade and 0.8 mg of sl NTG was given. The 3D data set was reconstructed in 5% intervals of the 67-82 % of the R-R cycle. Diastolic phases were analyzed on a dedicated work station using MPR, MIP and VRT modes. The patient received 80 cc of contrast. Aorta:  Normal size.  No calcifications.  No dissection. Aortic Valve:  Trileaflet.  No calcifications. Coronary Arteries:  Normal coronary origin.  Right dominance. RCA is a large dominant artery that gives rise to PDA and PLA. There is motion in the mid RCA, however there appears to be only mild plaque in the proximal and mid  segment. Distal RCA has a long segment of severe, predominantly calcified plaque with probable stenosis > 70%. PLA and PDA are poorly visualized. Left main is a large artery that gives rise to LAD and LCX arteries. Left main has no plaque. LAD is a large vessel that gives rise to two small diagonal arteries. Proximal LAD has a diffuse severe complex plaque, there is moderate non-calcified plaque with stenosis 50-69% followed by severe predominantly calcified plaque suspicious for stenosis > 70%. Mid LAD has another dense highly calcified lesion suspicious for stenosis > 70%. Mid to distal and distal LAD has mild plaque. D1,2 are very small branches with luminal diameter < 2 mm. LCX is a non-dominant artery that gives rise to one small OM1 branch. There is mild diffuse calcified plaque with stenosis 25-49%. Other findings: Normal pulmonary vein drainage into the left atrium. Normal left atrial appendage without a thrombus. Normal size of the pulmonary artery. IMPRESSION: 1. Coronary calcium score of 823. This was 99 percentile  for age and sex matched control. 2. Normal coronary origin with right dominance. 3. Study quality is affected by motion and dense calcifications. However, there appears to be possible severe stenosis in the proximal and mid LAD and in the distal RCA. A cardiac catheterization is recommended. CAD-RADS 4 Severe stenosis. (70-99%). Electronically Signed: By: Ena Dawley On: 02/02/2019 15:01   CT CORONARY FRACTIONAL FLOW RESERVE DATA PREP  Result Date: 02/13/2019 ADDENDUM     Etheleen Mayhew, MD on 02/02/2019  5:00 PM ADDENDUM REPORT: 02/02/2019 15:13 EXAM: OVER-READ INTERPRETATION  CT CHEST The following report is an over-read performed by radiologist Dr. Rebekah Chesterfield [facility] on 02/02/2019. This over-read does not include interpretation of cardiac or coronary anatomy or pathology. The coronary calcium score and cardiac CTA interpretation by the cardiologist is  attached. COMPARISON:  None. FINDINGS: Aortic atherosclerosis. Tiny calcified granuloma in the right middle lobe incidentally noted. Within the visualized portions of the thorax there are no suspicious appearing pulmonary nodules or masses, there is no acute consolidative airspace disease, no pleural effusions, no pneumothorax and no lymphadenopathy. Visualized portions of the upper abdomen are unremarkable. There are no aggressive appearing lytic or blastic lesions noted in the visualized portions of the skeleton. IMPRESSION: 1.  Aortic Atherosclerosis (ICD10-I70.0). Electronically Signed   By: Vinnie Langton M.D.   On: 02/02/2019 15:13 RESULT CLINICAL DATA:  44 year old male with hyperlipidemia, FH of early CAD and abnormal exercise treadmill stress test. EXAM: Cardiac/Coronary  CTA TECHNIQUE: The patient was scanned on a [employer]. FINDINGS: A 100 kV prospective scan was triggered in the descending thoracic aorta at 111 HU's. Axial non-contrast 3 mm slices were carried out through the heart. The data set was analyzed on a dedicated work station and scored using the [place]. Gantry rotation speed was 250 msecs and collimation was .6 mm. No beta blockade and 0.8 mg of sl NTG was given. The 3D data set was reconstructed in 5% intervals of the 67-82 % of the R-R cycle. Diastolic phases were analyzed on a dedicated work station using MPR, MIP and VRT modes. The patient received 80 cc of contrast. Aorta:  Normal size.  No calcifications.  No dissection. Aortic Valve:  Trileaflet.  No calcifications. Coronary Arteries:  Normal coronary origin.  Right dominance. RCA is a large dominant artery that gives rise to PDA and PLA. There is motion in the mid RCA, however there appears to be only mild plaque in the proximal and mid segment. Distal RCA has a long segment of severe, predominantly calcified plaque with probable stenosis > 70%. PLA and PDA are poorly visualized. Left main is a large artery that  gives rise to LAD and LCX arteries. Left main has no plaque. LAD is a large vessel that gives rise to two small diagonal arteries. Proximal LAD has a diffuse severe complex plaque, there is moderate non-calcified plaque with stenosis 50-69% followed by severe predominantly calcified plaque suspicious for stenosis > 70%. Mid LAD has another dense highly calcified lesion suspicious for stenosis > 70%. Mid to distal and distal LAD has mild plaque. D1,2 are very small branches with luminal diameter < 2 mm. LCX is a non-dominant artery that gives rise to one small OM1 branch. There is mild diffuse calcified plaque with stenosis 25-49%. Other findings: Normal pulmonary vein drainage into the left atrium. Normal left atrial appendage without a thrombus. Normal size of the pulmonary artery. IMPRESSION: 1. Coronary calcium score of 823. This was 99  percentile for age and sex matched control. 2. Normal coronary origin with right dominance. 3. Study quality is affected by motion and dense calcifications. However, there appears to be possible severe stenosis in the proximal and mid LAD and in the distal RCA. A cardiac catheterization is recommended. CAD-RADS 4 Severe stenosis. (70-99%). Electronically Signed: By: Ena Dawley On: 02/02/2019 15:01  CT CORONARY FRACTIONAL FLOW RESERVE FLUID ANALYSIS  Result Date: 02/13/2019 ADDENDUM     Etheleen Mayhew, MD on 02/02/2019  5:00 PM ADDENDUM REPORT: 02/02/2019 15:13 EXAM: OVER-READ INTERPRETATION  CT CHEST The following report is an over-read performed by radiologist Dr. Rebekah Chesterfield [facility] on 02/02/2019. This over-read does not include interpretation of cardiac or coronary anatomy or pathology. The coronary calcium score and cardiac CTA interpretation by the cardiologist is attached. COMPARISON:  None. FINDINGS: Aortic atherosclerosis. Tiny calcified granuloma in the right middle lobe incidentally noted. Within the visualized portions of the thorax  there are no suspicious appearing pulmonary nodules or masses, there is no acute consolidative airspace disease, no pleural effusions, no pneumothorax and no lymphadenopathy. Visualized portions of the upper abdomen are unremarkable. There are no aggressive appearing lytic or blastic lesions noted in the visualized portions of the skeleton. IMPRESSION: 1.  Aortic Atherosclerosis (ICD10-I70.0). Electronically Signed   By: Vinnie Langton M.D.   On: 02/02/2019 15:13 RESULT CLINICAL DATA:  44 year old male with hyperlipidemia, FH of early CAD and abnormal exercise treadmill stress test. EXAM: Cardiac/Coronary  CTA TECHNIQUE: The patient was scanned on a [employer]. FINDINGS: A 100 kV prospective scan was triggered in the descending thoracic aorta at 111 HU's. Axial non-contrast 3 mm slices were carried out through the heart. The data set was analyzed on a dedicated work station and scored using the [place]. Gantry rotation speed was 250 msecs and collimation was .6 mm. No beta blockade and 0.8 mg of sl NTG was given. The 3D data set was reconstructed in 5% intervals of the 67-82 % of the R-R cycle. Diastolic phases were analyzed on a dedicated work station using MPR, MIP and VRT modes. The patient received 80 cc of contrast. Aorta:  Normal size.  No calcifications.  No dissection. Aortic Valve:  Trileaflet.  No calcifications. Coronary Arteries:  Normal coronary origin.  Right dominance. RCA is a large dominant artery that gives rise to PDA and PLA. There is motion in the mid RCA, however there appears to be only mild plaque in the proximal and mid segment. Distal RCA has a long segment of severe, predominantly calcified plaque with probable stenosis > 70%. PLA and PDA are poorly visualized. Left main is a large artery that gives rise to LAD and LCX arteries. Left main has no plaque. LAD is a large vessel that gives rise to two small diagonal arteries. Proximal LAD has a diffuse severe complex  plaque, there is moderate non-calcified plaque with stenosis 50-69% followed by severe predominantly calcified plaque suspicious for stenosis > 70%. Mid LAD has another dense highly calcified lesion suspicious for stenosis > 70%. Mid to distal and distal LAD has mild plaque. D1,2 are very small branches with luminal diameter < 2 mm. LCX is a non-dominant artery that gives rise to one small OM1 branch. There is mild diffuse calcified plaque with stenosis 25-49%. Other findings: Normal pulmonary vein drainage into the left atrium. Normal left atrial appendage without a thrombus. Normal size of the pulmonary artery. IMPRESSION: 1. Coronary calcium score of 823. This was 99  percentile for age and sex matched control. 2. Normal coronary origin with right dominance. 3. Study quality is affected by motion and dense calcifications. However, there appears to be possible severe stenosis in the proximal and mid LAD and in the distal RCA. A cardiac catheterization is recommended. CAD-RADS 4 Severe stenosis. (70-99%). Electronically Signed: By: Ena Dawley On: 02/02/2019 15:01  Disposition   Pt is being discharged home today in good condition.  Follow-up Plans & Appointments    Follow-up Information    [place] Follow up.   Specialty: Cardiology Why: Please see appointment information below with Laurann Montana in [place] on 02/20/19. Urban Gibson is one of our nurse practitioners that works with our cardiology team and Dr. Saunders Revel. Contact information: [address], [place] [phone]         Discharge Instructions    Amb Referral to Cardiac Rehabilitation   Complete by: As directed    Diagnosis: Coronary Stents   After initial evaluation and assessments completed: Virtual Based Care may be provided alone or in conjunction with Phase 2 Cardiac Rehab based on patient barriers.: Yes   Diet - low sodium heart healthy   Complete by: As directed     Increase activity slowly   Complete by: As directed    No driving for 2 days. No lifting over 5 lbs for 1 week. No sexual activity for 1 week. Per our discussion, you may return to work on 02/18/19 (work note given) - but if you feel fine on 02/16/19, you may return then. Keep procedure site clean & dry. If you notice increased pain, swelling, bleeding or pus, call/return!  You may shower, but no soaking baths/hot tubs/pools for 1 week.  We have stopped your isosorbide due to your headache side effect.  You were started on a new medicine called Brilinta to take in addition to aspirin. If you notice any bleeding such as blood in stool, black tarry stools, blood in urine, nosebleeds or any other unusual bleeding, call your doctor immediately. It is not normal to have this kind of bleeding while on a blood thinner and usually indicates there is an underlying problem with one of your body systems that needs to be checked out.      Discharge Medications   Allergies as of 02/14/2019      Reactions   Imdur [isosorbide Nitrate]    Did not tolerate due to headache      Medication List    STOP taking these medications   isosorbide mononitrate 30 MG 24 hr tablet Commonly known as: IMDUR     TAKE these medications   aspirin EC 81 MG tablet Take 81 mg by mouth daily.   atorvastatin 80 MG tablet Commonly known as: LIPITOR Take 1 tablet (80 mg total) by mouth daily.   cyclobenzaprine 10 MG tablet Commonly known as: FLEXERIL Take 10 mg by mouth 2 (two) times daily as needed for muscle spasms.   esomeprazole 40 MG capsule Commonly known as: NexIUM Take 1 capsule (40 mg total) by mouth daily. 30 minutes before food What changed: when to take this   fluticasone 50 MCG/ACT nasal spray Commonly known as: FLONASE Place 1 spray into both nostrils every evening.   LORazepam 1 MG tablet Commonly known as: ATIVAN Take 1 tablet (1 mg total) by mouth daily as needed for anxiety. What changed:    how much to take  when to take this   metoprolol tartrate  25 MG tablet Commonly known as: LOPRESSOR Take 0.5 tablets (12.5 mg total) by mouth 2 (two) times daily.   multivitamin with minerals Tabs tablet Take 1 tablet by mouth 3 (three) times a week.   nitroGLYCERIN 0.4 MG SL tablet Commonly known as: Nitrostat Place 1 tablet (0.4 mg total) under the tongue every 5 (five) minutes as needed for chest pain.   ticagrelor 90 MG Tabs tablet Commonly known as: BRILINTA Take 1 tablet (90 mg total) by mouth 2 (two) times daily.          Outstanding Labs/Studies   If the patient is tolerating statin at time of follow-up appointment, would consider rechecking liver function/lipid panel as outpatient within 4-6 weeks of initial titration.  Duration of Discharge Encounter   Greater than 30 minutes including physician time.  Signed, Charlie Pitter, PA-C 02/14/2019, 8:46 AM   Cardiology Attending  Patient seen and examined. He is doing well after undergoing PCI of LAD. No chest pain or sob. His exam is as noted above. His right arm feels ok. He will be discharged home with followup as noted above. Instructed on lifestyle modification, especially statin therapy.  Mikle Bosworth.D.

## 2019-02-16 ENCOUNTER — Telehealth: Payer: Self-pay | Admitting: Internal Medicine

## 2019-02-16 NOTE — Telephone Encounter (Signed)
First attempt for Transitional Care Management call, left message for patient to [place] office , will continue to monitor and attempt TCM. 

## 2019-02-18 ENCOUNTER — Encounter: Payer: Self-pay | Admitting: Nurse Practitioner

## 2019-02-19 ENCOUNTER — Ambulatory Visit: Payer: 59 | Admitting: Internal Medicine

## 2019-02-19 NOTE — Progress Notes (Signed)
Office Visit    Patient Name: Marcus Roberts Date of Encounter: 02/20/2019  Primary Care Provider:  McLean-Scocuzza, Nino Glow, MD Primary Cardiologist:  Nelva Bush, MD Electrophysiologist:  None   Chief Complaint    Marcus Roberts is a 44 y.o. male with a hx of CAD, anxiety/depression, GERD, obesity s/p gastric sleeve, nephrolithiasis presents today for hospital follow up after cardiac catheterization.   Past Medical History    Past Medical History:  Diagnosis Date  . Anxiety   . Aortic atherosclerosis ([place])   . CAD (coronary artery disease)    a. 12/2018 ETT: Ex time 9:39. 12mm horiz ST dep in V4-V6 with abnormal coronary CTA. b. Cath 02/2019 with multivessel CAD s/p orbital atherectomy/DES to prox-mid LAD, EF normal.  . Chest pain   . Depression   . GERD (gastroesophageal reflux disease)   . History of echocardiogram    a. 12/2018 Echo: EF 60-65%, no rwma. Mildly dil LA.  Marland Kitchen Hyperlipidemia   . Kidney stones    Past Surgical History:  Procedure Laterality Date  . CHOLECYSTECTOMY    . CORONARY ATHERECTOMY N/A 02/13/2019   Procedure: CORONARY ATHERECTOMY;  Surgeon: Nelva Bush, MD;  Location: [facility];  Service: Cardiovascular;  Laterality: N/A;  . CORONARY STENT INTERVENTION N/A 02/13/2019   Procedure: CORONARY STENT INTERVENTION;  Surgeon: Nelva Bush, MD;  Location: [facility];  Service: Cardiovascular;  Laterality: N/A;  . INTRAVASCULAR PRESSURE WIRE/FFR STUDY N/A 02/13/2019   Procedure: INTRAVASCULAR PRESSURE WIRE/FFR STUDY;  Surgeon: Nelva Bush, MD;  Location: [facility];  Service: Cardiovascular;  Laterality: N/A;  . INTRAVASCULAR ULTRASOUND/IVUS N/A 02/13/2019   Procedure: Intravascular Ultrasound/IVUS;  Surgeon: Nelva Bush, MD;  Location: [facility];  Service: Cardiovascular;  Laterality: N/A;  . LEFT HEART CATH AND CORONARY ANGIOGRAPHY N/A 02/13/2019   Procedure: LEFT HEART CATH AND CORONARY ANGIOGRAPHY;  Surgeon: Nelva Bush, MD;  Location: [facility];  Service: Cardiovascular;  Laterality: N/A;  . vertical sleeve gastrectomy     2013 85% stomach removed was 280 lbs before surgery    Allergies  Allergies  Allergen Reactions  . Imdur [Isosorbide Nitrate]     Did not tolerate due to headache    History of Present Illness    Marcus Roberts is a 44 y.o. male with a hx of CAD, anxiety/depression, GERD, obesity s/p gastric sleeve, nephrolithiasis. Family history notable for premature CAD. Last seen while hospitalized for cardiac catheterization.  Evaluated in November 2020 for chest tightness. Underwent exercise treadmill testing November notable for good exercise tolerance (walked 9:39) however developed 45mm horizontal ST segment depression in leads V4 through V6. Subsequent cardiac CT 02/02/19 with calcium score 923, placing him in the 99th percentile for age/sex. Noted >70% prox and mid stenosis in LAD and distal RCA. Imdur added as outpatient and atorvastatin up titrated. Echo 12/2018 with LVEF 60-65%, normal RV, mildly dilated LA. He was recommended for cardiac catheterization.   Admitted 02/23/19 for cardiac catheterization. Cardiac cath with 3-vessel CAD (60% prox, 90% mid, 40% distal LAD - CTO of mid LCx with L to L collaterals, 40 and 60% mid/distal RCA stenosis borderine hemodynamically significant by FFFR, CTO RCA with left to right collaterals). Underwent atherectomy and PCI to prox and mid LAD with Synergy 3.0 x 17mm DES. Recommended for DAPT Brilinta and aspirin for at least 6 months, ideally longer. Imdur was discontinued due to headache.   Presents today for follow-up  after cardiac catheterization.  He reports feeling overall well.  Does report some continued dyspnea with exertion and "chest burning".  Tells me this is different than his previous chest pain.  He denies chest pain, pressure, tightness.  Tells me he has tried up to start walking.  Tells me that he is sometimes will get  dyspneic with exertion with his walks and another time might not.  He was hopeful that after his cardiac catheterization all of his symptoms would go away immediately.  We discussed that this may take some time and reassurance was provided.  We reviewed his cardiac catheterization report in depth.  We discussed continued stable anginal symptoms may be expected.  We discussed the benefits of cardiac rehab, he is agreeable to proceed.  We discussed that some of the DOE could be related to [place].  He tells me he would like to continue to try this medication will take with caffeine.  He is agreeable that if it continues to bother him he will let us know and we can consider changing.  He has wife has been checking his blood pressure at home with an arm cuff and reports that it is well controlled at home.  EKGs/Labs/Other Studies Reviewed:   The following studies were reviewed today:  Cardiac cath 02/13/19 Conclusions: 1. Significant three-vessel coronary artery disease, including sequential 60% proximal, 90% mid, and 40% distal LAD stenoses, chronic total occlusion of mid LCx with left to left collaterals, sequential 40 and 60% mid/distal RCA stenoses that are borderline hemodynamically significant by FFR, and chronic total occlusion of RCA continuation with left-to-right collaterals. 2. Normal left ventricular systolic function with mildly elevated filling pressure. 3. Successful IVUS guided orbital atherectomy and drug-eluting stent placement to the proximal and mid LAD using Synergy 3.0 x 38 mm drug-eluting stent with 0% residual stenosis and TIMI-3 flow.   Recommendations: 1. Overnight extended recovery. 2. Dual antiplatelet therapy with aspirin and ticagrelor for at least 6 months, ideally longer. 3. Aggressive secondary prevention, including high intensity statin therapy.  Echo 12/26/18  1. Left ventricular ejection fraction, by visual estimation, is 60 to 65%. The left ventricle has normal  function. There is no left ventricular hypertrophy.  2. Global right ventricle has normal systolic function.The right ventricular size is normal. No increase in right ventricular wall thickness.  3. Left atrial size was mildly dilated.  4. TR signal is inadequate for assessing pulmonary artery systolic pressure.   EKG:  No EKG today  Recent Labs: 12/08/2018: ALT 20 02/14/2019: BUN 9; Creatinine, Ser 0.82; Hemoglobin 14.7; Platelets 177; Potassium 3.6; Sodium 141  Recent Lipid Panel    Component Value Date/Time   CHOL 218 (H) 06/13/2017 0817   TRIG 173.0 (H) 06/13/2017 0817   HDL 42.80 06/13/2017 0817   CHOLHDL 5 06/13/2017 0817   VLDL 34.6 06/13/2017 0817   LDLCALC 140 (H) 06/13/2017 0817    Home Medications   Current Meds  Medication Sig  . aspirin EC 81 MG tablet Take 81 mg by mouth daily.   Marland Kitchen atorvastatin (LIPITOR) 80 MG tablet Take 1 tablet (80 mg total) by mouth daily.  . cyclobenzaprine (FLEXERIL) 10 MG tablet Take 10 mg by mouth 2 (two) times daily as needed for muscle spasms.   Marland Kitchen esomeprazole (NEXIUM) 40 MG capsule Take 1 capsule (40 mg total) by mouth daily. 30 minutes before food (Patient taking differently: Take 40 mg by mouth every evening. 30 minutes before food)  . fluticasone (FLONASE) 50 MCG/ACT nasal  spray Place 1 spray into both nostrils every evening.   Marland Kitchen LORazepam (ATIVAN) 1 MG tablet Take 0.5 mg by mouth daily.    Review of Systems      Review of Systems  Constitution: Negative for chills, fever and malaise/fatigue.  Cardiovascular: Positive for dyspnea on exertion. Negative for chest pain, leg swelling, near-syncope, orthopnea, palpitations and syncope.       "intermittent chest burning"  Respiratory: Negative for cough, shortness of breath and wheezing.   Gastrointestinal: Negative for nausea and vomiting.  Neurological: Negative for dizziness, light-headedness and weakness.   All other systems reviewed and are otherwise negative except as noted  above.  Physical Exam    VS:  BP 120/80 (BP Location: Left Arm, Patient Position: Sitting, Cuff Size: Normal)   Pulse 63   Ht 5\' 6"  (1.676 m)   Wt 209 lb 6 oz (95 kg)   SpO2 99%   BMI 33.79 kg/m  , BMI Body mass index is 33.79 kg/m. GEN: Well nourished, well developed, in no acute distress. HEENT: normal. Neck: Supple, no JVD, carotid bruits, or masses. Cardiac: RRR, no murmurs, rubs, or gallops. No clubbing, cyanosis, edema.  Radials/PT 2+ and equal bilaterally.  Respiratory:  Respirations regular and unlabored, clear to auscultation bilaterally. GI: Soft, nontender, nondistended MS: No deformity or atrophy. Skin: Warm and dry, no rash. R radial cath site clean, dry, intact. No signs or symptoms of infection. Minimal yellowed ecchymosis noted directly surrounding cath site. Neuro:  Strength and sensation are intact. Psych: Normal affect.  Assessment & Plan    1. CAD - Cath 02/13/19 with significant 3-vessel CAD. Mid/distal RCA stenoses (40%/60%) borderline hemodynamically significant by FFR and CTo RCA continuation with L to R collaterals. LAD s/p atherectomy and DES to prox/mid LAD. R radial cath site healing appropriately.  GDMT includes DAPT Brilinta/aspirin, beta blocker, statin, PRN nitroglycerin. He was recommended DAPT for at least 6 months, ideally longer.   He has noted some DOE and "chest burning" that occurs with some activity. This is intermittent. Etiology Brilinta vs continued anginal symptoms. We discussed benefits of Brilinta, he is agreeable to take with caffeine and try for another 1-2 weeks to see if symptoms improve. If not, consider transition to Prasugrel. He will be referred to cardiac rehab for stable anginal symptoms.   Previously prescribed Imdur was discontinued during hospitalization for cath due to headache. If anginal symptoms do not improve post cardiac rehab could consider Ranexa  2. HLD - Increased Atorvastatin to 80mg  on 02/04/19. Follow up  lipid/liver in 4 weeks.   3. Obesity - s/p prior gastric sleeve. Recommend heart healthy diet and gradual return to regular cardiovascular exercise.   Disposition: Follow up in 2 month(s) with Dr. Saunders Revel or APP   Loel Dubonnet, NP 02/20/2019, 8:29 AM

## 2019-02-20 ENCOUNTER — Other Ambulatory Visit: Payer: Self-pay

## 2019-02-20 ENCOUNTER — Ambulatory Visit (INDEPENDENT_AMBULATORY_CARE_PROVIDER_SITE_OTHER): Payer: 59 | Admitting: Family

## 2019-02-20 ENCOUNTER — Encounter: Payer: Self-pay | Admitting: Family

## 2019-02-20 VITALS — BP 120/80 | HR 63 | Ht 66.0 in | Wt 209.4 lb

## 2019-02-20 DIAGNOSIS — R0600 Dyspnea, unspecified: Secondary | ICD-10-CM

## 2019-02-20 DIAGNOSIS — I25118 Atherosclerotic heart disease of native coronary artery with other forms of angina pectoris: Secondary | ICD-10-CM

## 2019-02-20 DIAGNOSIS — E782 Mixed hyperlipidemia: Secondary | ICD-10-CM

## 2019-02-20 DIAGNOSIS — R0609 Other forms of dyspnea: Secondary | ICD-10-CM

## 2019-02-20 NOTE — Patient Instructions (Signed)
Medication Instructions:  No changes *If you need a refill on your cardiac medications before your next appointment, please call your pharmacy*  Lab Work: Your physician recommends that you return for lab work in:4 weeks at the [facility] mall. You will need to be fasting.  No appt is needed. Hours are M-F 7AM- 6 PM. (lipids and liver)  If you have labs (blood work) drawn today and your tests are completely normal, you will receive your results only by: Marland Kitchen MyChart Message (if you have MyChart) OR . A paper copy in the mail If you have any lab test that is abnormal or we need to change your treatment, we will call you to review the results.  Testing/Procedures: 1- Ref to Cardiac Rehab  Follow-Up: At [facility], you and your health needs are our priority.  As part of our continuing mission to provide you with exceptional heart care, we have created designated Provider Care Teams.  These Care Teams include your primary Cardiologist (physician) and Advanced Practice Providers (APPs -  Physician Assistants and Nurse Practitioners) who all work together to provide you with the care you need, when you need it.  Your next appointment:   2 month(s)  The format for your next appointment:   In Person  Provider:    You may see Nelva Bush, MD or Murray Hodgkins, NP.

## 2019-02-26 MED ORDER — CLOPIDOGREL BISULFATE 75 MG PO TABS: 75.0000 mg | ORAL_TABLET | Freq: Every day | ORAL | 3 refills | Status: DC

## 2019-03-11 ENCOUNTER — Encounter: Payer: 59 | Attending: Internal Medicine | Admitting: *Deleted

## 2019-03-11 ENCOUNTER — Encounter: Payer: Self-pay | Admitting: *Deleted

## 2019-03-11 ENCOUNTER — Other Ambulatory Visit: Payer: Self-pay

## 2019-03-11 DIAGNOSIS — Z79899 Other long term (current) drug therapy: Secondary | ICD-10-CM | POA: Insufficient documentation

## 2019-03-11 DIAGNOSIS — Z7902 Long term (current) use of antithrombotics/antiplatelets: Secondary | ICD-10-CM | POA: Insufficient documentation

## 2019-03-11 DIAGNOSIS — K219 Gastro-esophageal reflux disease without esophagitis: Secondary | ICD-10-CM | POA: Insufficient documentation

## 2019-03-11 DIAGNOSIS — Z7982 Long term (current) use of aspirin: Secondary | ICD-10-CM | POA: Insufficient documentation

## 2019-03-11 DIAGNOSIS — Z95820 Peripheral vascular angioplasty status with implants and grafts: Secondary | ICD-10-CM

## 2019-03-11 DIAGNOSIS — Z955 Presence of coronary angioplasty implant and graft: Secondary | ICD-10-CM | POA: Insufficient documentation

## 2019-03-11 DIAGNOSIS — I2510 Atherosclerotic heart disease of native coronary artery without angina pectoris: Secondary | ICD-10-CM | POA: Insufficient documentation

## 2019-03-11 DIAGNOSIS — E785 Hyperlipidemia, unspecified: Secondary | ICD-10-CM | POA: Insufficient documentation

## 2019-03-11 DIAGNOSIS — Z7901 Long term (current) use of anticoagulants: Secondary | ICD-10-CM | POA: Insufficient documentation

## 2019-03-11 MED ORDER — AMLODIPINE BESYLATE 2.5 MG PO TABS: 2.5000 mg | ORAL_TABLET | Freq: Every day | ORAL | 5 refills | Status: DC

## 2019-03-11 NOTE — Progress Notes (Signed)
Completed virtual orientation today.  Documentation for diagnosis can be found in [facility] An encounter 02/13/19. EP eval is scheduled for 2/4 at 8am.

## 2019-03-12 ENCOUNTER — Encounter: Payer: 59 | Admitting: *Deleted

## 2019-03-12 ENCOUNTER — Other Ambulatory Visit: Payer: Self-pay

## 2019-03-12 VITALS — Ht 67.9 in | Wt 211.2 lb

## 2019-03-12 DIAGNOSIS — I2510 Atherosclerotic heart disease of native coronary artery without angina pectoris: Secondary | ICD-10-CM | POA: Diagnosis not present

## 2019-03-12 DIAGNOSIS — Z955 Presence of coronary angioplasty implant and graft: Secondary | ICD-10-CM | POA: Diagnosis present

## 2019-03-12 DIAGNOSIS — Z95820 Peripheral vascular angioplasty status with implants and grafts: Secondary | ICD-10-CM

## 2019-03-12 DIAGNOSIS — Z79899 Other long term (current) drug therapy: Secondary | ICD-10-CM | POA: Diagnosis not present

## 2019-03-12 DIAGNOSIS — Z7901 Long term (current) use of anticoagulants: Secondary | ICD-10-CM | POA: Diagnosis not present

## 2019-03-12 DIAGNOSIS — K219 Gastro-esophageal reflux disease without esophagitis: Secondary | ICD-10-CM | POA: Diagnosis not present

## 2019-03-12 DIAGNOSIS — E785 Hyperlipidemia, unspecified: Secondary | ICD-10-CM | POA: Diagnosis not present

## 2019-03-12 DIAGNOSIS — Z7982 Long term (current) use of aspirin: Secondary | ICD-10-CM | POA: Diagnosis not present

## 2019-03-12 DIAGNOSIS — Z7902 Long term (current) use of antithrombotics/antiplatelets: Secondary | ICD-10-CM | POA: Diagnosis not present

## 2019-03-12 NOTE — Progress Notes (Signed)
Cardiac Individual Treatment Plan  Patient Details  Name: Marcus Roberts MRN: [id number] Date of Birth: 06/24/1975 Referring Provider:     Cardiac Rehab from 03/12/2019 in [facility] Cardiac and Pulmonary Rehab  Referring Provider  End, Christopher MD      Initial Encounter Date:    Cardiac Rehab from 03/12/2019 in [facility] Cardiac and Pulmonary Rehab  Date  03/12/19      Visit Diagnosis: S/P angioplasty with stent  Patient's Home Medications on Admission:  Current Outpatient Medications:  .  amLODipine (NORVASC) 2.5 MG tablet, Take 1 tablet (2.5 mg total) by mouth daily., Disp: 30 tablet, Rfl: 5 .  aspirin EC 81 MG tablet, Take 81 mg by mouth daily. , Disp: , Rfl:  .  atorvastatin (LIPITOR) 80 MG tablet, Take 1 tablet (80 mg total) by mouth daily., Disp: 30 tablet, Rfl: 11 .  clopidogrel (PLAVIX) 75 MG tablet, Take 1 tablet (75 mg total) by mouth daily. Stop Brilinta., Disp: 90 tablet, Rfl: 3 .  cyclobenzaprine (FLEXERIL) 10 MG tablet, Take 10 mg by mouth 2 (two) times daily as needed for muscle spasms. , Disp: , Rfl:  .  esomeprazole (NEXIUM) 40 MG capsule, Take 1 capsule (40 mg total) by mouth daily. 30 minutes before food (Patient taking differently: Take 40 mg by mouth every evening. 30 minutes before food), Disp: 90 capsule, Rfl: 3 .  fluticasone (FLONASE) 50 MCG/ACT nasal spray, Place 1 spray into both nostrils every evening. , Disp: , Rfl:  .  LORazepam (ATIVAN) 1 MG tablet, Take 0.5 mg by mouth daily., Disp: , Rfl:  .  metoprolol tartrate (LOPRESSOR) 25 MG tablet, Take 0.5 tablets (12.5 mg total) by mouth 2 (two) times daily., Disp: 90 tablet, Rfl: 1 .  Multiple Vitamin (MULTIVITAMIN WITH MINERALS) TABS tablet, Take 1 tablet by mouth 3 (three) times a week., Disp: , Rfl:  .  nitroGLYCERIN (NITROSTAT) 0.4 MG SL tablet, Place 1 tablet (0.4 mg total) under the tongue every 5 (five) minutes as needed for chest pain., Disp: 25 tablet, Rfl: prn  Past Medical History: Past Medical History:   Diagnosis Date  . Anxiety   . Aortic atherosclerosis ([place])   . CAD (coronary artery disease)    a. 12/2018 ETT: Ex time 9:39. 39m horiz ST dep in V4-V6 with abnormal coronary CTA. b. Cath 02/2019 with multivessel CAD s/p orbital atherectomy/DES to prox-mid LAD, EF normal.  . Chest pain   . Depression   . GERD (gastroesophageal reflux disease)   . History of echocardiogram    a. 12/2018 Echo: EF 60-65%, no rwma. Mildly dil LA.  .Marland KitchenHyperlipidemia   . Kidney stones     Tobacco Use: Social History   Tobacco Use  Smoking Status Never Smoker  Smokeless Tobacco Never Used    Labs: Recent Review [employer], physiological   Labs for ITP Cardiac and Pulmonary Rehab Latest Ref Rng & Units 06/13/2017   Cholestrol 0 - 200 mg/dL 218(H)   LDLCALC 0 - 99 mg/dL 140(H)   HDL >39.00 mg/dL 42.80   Trlycerides 0.0 - 149.0 mg/dL 173.0(H)       Exercise Target Goals: Exercise Program Goal: Individual exercise prescription set using results from initial 6 min walk test and THRR while considering  patient's activity barriers and safety.   Exercise Prescription Goal: Initial exercise prescription builds to 30-45 minutes a day of aerobic activity, 2-3 days per week.  Home exercise guidelines will be given to patient during program as part of  exercise prescription that the participant will acknowledge.  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 HR  93 bpm     Max Ex. BP  146/62     2 Minute Post BP  134/64        Oxygen Initial Assessment:   Oxygen Re-Evaluation:   Oxygen Discharge (Final Oxygen Re-Evaluation):   Initial Exercise Prescription: Initial Exercise Prescription - 03/12/19 0900      Date of Initial Exercise RX and Referring Provider   Date  03/12/19    Referring Provider  End, Harrell Gave MD      Treadmill   MPH  2.5    Grade  1    Minutes  15    METs  3.26      Elliptical   Level  1    Speed  3    Minutes  15    METs  3      Prescription Details   Frequency (times per week)  2   3 starting back in March   Duration  Progress to 30 minutes of continuous aerobic without signs/symptoms of physical distress      Intensity   THRR 40-80% of Max Heartrate  114-157    Ratings of Perceived Exertion  11-13    Perceived Dyspnea  0-4      Progression   Progression  Continue to progress workloads to maintain intensity without signs/symptoms of physical distress.      Resistance Training   Training Prescription  Yes    Weight  4 lb    Reps  10-15       Perform Capillary Blood Glucose checks as needed.  Exercise Prescription Changes: Exercise Prescription Changes    Row Name 03/12/19 0900             Response to Exercise   Blood Pressure (Admit)  136/62       Blood Pressure (Exercise)  146/62       Blood Pressure (Exit)  134/64       Heart Rate (Admit)  71 bpm       Heart Rate (Exercise)  93 bpm       Heart Rate (Exit)  70 bpm       Oxygen Saturation (Admit)  96 %       Oxygen Saturation (Exercise)  99 %       Rating of Perceived Exertion (Exercise)  7       Symptoms  none       Comments  walk test results          Exercise Comments:   Exercise Goals and Review: Exercise Goals    Row Name  03/12/19 0908             Exercise Goals   Increase Physical Activity  Yes       Intervention  Provide advice, education, support and counseling about physical activity/exercise needs.;Develop an individualized exercise prescription for aerobic and resistive training based on initial evaluation findings, risk stratification, comorbidities and participant's personal goals.       Expected Outcomes  Short Term: Attend rehab on a regular basis to increase amount of physical activity.;Long Term: Add in home exercise to make exercise part of routine and to increase amount of physical activity.;Long Term: Exercising regularly at least 3-5 days a week.       Increase Strength and Stamina  Yes       Intervention  Provide advice, education, support and counseling about physical activity/exercise needs.;Develop an individualized exercise prescription for aerobic and resistive training based on initial evaluation findings, risk stratification, comorbidities and participant's personal goals.       Expected Outcomes  Short Term: Increase workloads from initial exercise prescription for resistance, speed, and METs.;Short Term: Perform resistance training exercises routinely during rehab and add in resistance training at home;Long Term: Improve cardiorespiratory fitness, muscular endurance and strength as measured by increased METs and functional capacity (6MWT)       Able to understand and use rate of perceived exertion (RPE) scale  Yes       Intervention  Provide education and explanation on how to use RPE scale       Expected Outcomes  Short Term: Able to use RPE daily in rehab to express subjective intensity level;Long Term:  Able to use RPE to guide intensity level when exercising independently       Able to understand and use Dyspnea scale  Yes       Intervention  Provide education and explanation on how to use Dyspnea scale       Expected Outcomes  Short Term: Able to use Dyspnea scale daily in rehab to  express subjective sense of shortness of breath during exertion;Long Term: Able to use Dyspnea scale to guide intensity level when exercising independently       Knowledge and understanding of Target Heart Rate Range (THRR)  Yes       Intervention  Provide education and explanation of THRR including how the numbers were predicted and where they are located for reference       Expected Outcomes  Short Term: Able to state/look up THRR;Short Term: Able to use daily as guideline for intensity in rehab;Long Term: Able to use THRR to govern intensity when exercising independently       Able to check pulse independently  Yes       Intervention  Provide education and demonstration on how to check pulse in carotid and radial arteries.;Review the importance of being able to check your own pulse for safety during independent exercise       Expected Outcomes  Short Term: Able to explain why pulse checking is important during independent exercise;Long Term: Able to check pulse independently and accurately       Understanding of Exercise Prescription  Yes  Intervention  Provide education, explanation, and written materials on patient's individual exercise prescription       Expected Outcomes  Short Term: Able to explain program exercise prescription;Long Term: Able to explain home exercise prescription to exercise independently          Exercise Goals Re-Evaluation :   Discharge Exercise Prescription (Final Exercise Prescription Changes): Exercise Prescription Changes - 03/12/19 0900      Response to Exercise   Blood Pressure (Admit)  136/62    Blood Pressure (Exercise)  146/62    Blood Pressure (Exit)  134/64    Heart Rate (Admit)  71 bpm    Heart Rate (Exercise)  93 bpm    Heart Rate (Exit)  70 bpm    Oxygen Saturation (Admit)  96 %    Oxygen Saturation (Exercise)  99 %    Rating of Perceived Exertion (Exercise)  7    Symptoms  none    Comments  walk test results       Nutrition:  Target  Goals: Understanding of nutrition guidelines, daily intake of sodium <1580m, cholesterol <208m calories 30% from fat and 7% or less from saturated fats, daily to have 5 or more servings of fruits and vegetables.  Biometrics: Pre Biometrics - 03/12/19 0909      Pre Biometrics   Height  5' 7.9" (1.725 m)    Weight  211 lb 3.2 oz (95.8 kg)    BMI (Calculated)  32.19    Single Leg Stand  30 seconds        Nutrition Therapy Plan and Nutrition Goals:   Nutrition Assessments: Nutrition Assessments - 03/12/19 0909      MEDFICTS Scores   Pre Score  35       Nutrition Goals Re-Evaluation:   Nutrition Goals Discharge (Final Nutrition Goals Re-Evaluation):   Psychosocial: Target Goals: Acknowledge presence or absence of significant depression and/or stress, maximize coping skills, provide positive support system. Participant is able to verbalize types and ability to use techniques and skills needed for reducing stress and depression.   Initial Review & Psychosocial Screening: Initial Psych Review & Screening - 03/11/19 1446      Initial Review   Current issues with  History of Depression;Current Anxiety/Panic;Current Depression;Current Psychotropic Meds;Current Stress Concerns    Source of Stress Concerns  Family    Comments  History depression and anxiety.  This is the lowest that is has ever been.  No plans to hurt himself.  He was well managed previously with medications.  He has reached out to a theapist as well.  Home life is pretty good, some ED issues but very supportive.      Family Dynamics   Good Support System?  Yes   spouse, signed up for therapy as well     Barriers   Psychosocial barriers to participate in program  The patient should benefit from training in stress management and relaxation.;Psychosocial barriers identified (see note)      Screening Interventions   Interventions  Encouraged to exercise;To provide support and resources with identified  psychosocial needs;Provide feedback about the scores to participant    Expected Outcomes  Short Term goal: Utilizing psychosocial counselor, staff and physician to assist with identification of specific Stressors or current issues interfering with healing process. Setting desired goal for each stressor or current issue identified.;Long Term Goal: Stressors or current issues are controlled or eliminated.;Short Term goal: Identification and review with participant of any Quality of Life or Depression concerns  found by scoring the questionnaire.;Long Term goal: The participant improves quality of Life and PHQ9 Scores as seen by post scores and/or verbalization of changes       Quality of Life Scores:  Quality of Life - 03/12/19 0909      Quality of Life   Select  Quality of Life      Quality of Life Scores   Health/Function Pre  13.6 %    Socioeconomic Pre  20.57 %    Psych/Spiritual Pre  9.21 %    Family Pre  21.5 %    GLOBAL Pre  15.29 %      Scores of 19 and below usually indicate a poorer quality of life in these areas.  A difference of  2-3 points is a clinically meaningful difference.  A difference of 2-3 points in the total score of the Quality of Life Index has been associated with significant improvement in overall quality of life, self-image, physical symptoms, and general health in studies assessing change in quality of life.  PHQ-9: Recent Review Flowsheet Data    Depression screen [facility] 2/9 03/12/2019 12/04/2017 06/12/2017   Decreased Interest 2 2 1    Down, Depressed, Hopeless 2 1 2    PHQ - 2 Score 4 3 3    Altered sleeping 2 0 0   Tired, decreased energy 2 1 2    Change in appetite 2 0 2   Feeling bad or failure about yourself  3 1 3    Trouble concentrating 2 0 1   Moving slowly or fidgety/restless 1 0 1   Suicidal thoughts 0 0 1   PHQ-9 Score 16 5 13    Difficult doing work/chores Somewhat difficult Not difficult at all Somewhat difficult     Interpretation of Total Score   Total Score Depression Severity:  1-4 = Minimal depression, 5-9 = Mild depression, 10-14 = Moderate depression, 15-19 = Moderately severe depression, 20-27 = Severe depression   Psychosocial Evaluation and Intervention: Psychosocial Evaluation - 03/11/19 1502      Psychosocial Evaluation & Interventions   Interventions  Encouraged to exercise with the program and follow exercise prescription;Stress management education    Comments  Marcus Roberts is coming into rehab after having a stent placed.  He thought his heart was good but then started having anginal symptoms and failed his stress test.  He was able to get one stent but has two other blockages that are still causing some anginal pain.  He does have collarterals and this doctor is hoping that rehab will help.  He was previously very active since his gastric surgery in 2013.  His ultimate goal would be to get back to being able to backpack again.  He needs to rebuild his stamina and would like to no longer have any pain.  He is fearful of the thought of by pass surgery.  He tried to run recently and had more pain, they have adjusted some medications to try to help.  His wife is supportive but also scared.  His depression has also increased since all of this has happended and is at its highest.  He has sought out a therapist and I encouraged him that sometimes an extra boost in medicine can help get over the hump temporarily but he is also concerned about the effect anti-depressants have on his libido.    Expected Outcomes  Short: Attend rehab to regain confidence in exericse and reduce symptoms.  Long: Continue to talk to therapist and doctors about mental health  concerns.    Continue Psychosocial Services   Follow up required by staff       Psychosocial Re-Evaluation:   Psychosocial Discharge (Final Psychosocial Re-Evaluation):   Vocational Rehabilitation: Provide vocational rehab assistance to qualifying candidates.   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 Participant will state understanding/return demonstration of topics presented as noted by education test scores.  Learning Barriers/Preferences:   Education Topics:  AED/CPR: - Group verbal and written instruction with the use of models to demonstrate the basic use of the AED with the basic ABC's of resuscitation.   General Nutrition Guidelines/Fats and Fiber: -Group instruction provided by verbal, written material, models and posters to present the general guidelines for heart healthy nutrition. Gives an explanation and review of dietary fats and fiber.   Controlling Sodium/Reading Food Labels: -Group verbal and written material supporting the discussion of sodium use in heart healthy nutrition. Review and explanation with models, verbal and written materials for utilization of the food label.   Exercise Physiology & General Exercise Guidelines: - Group verbal and written instruction with models to review the exercise physiology of the cardiovascular system and associated critical values. Provides general exercise guidelines with specific guidelines to those with heart or lung disease.    Aerobic Exercise & Resistance Training: - Gives group verbal and written instruction on the various components of exercise. Focuses on aerobic and resistive training programs and the benefits of this training and how to safely progress through these programs..   Flexibility, Balance, Mind/Body Relaxation: Provides group verbal/written instruction on the benefits of flexibility and balance training, including mind/body  exercise modes such as yoga, pilates and tai chi.  Demonstration and skill practice provided.   Stress and Anxiety: - Provides group verbal and written instruction about the health risks of elevated stress and causes of high stress.  Discuss the correlation between heart/lung disease and anxiety and treatment options. Review healthy ways to manage with stress and anxiety.   Depression: - Provides group verbal and written instruction on the correlation between heart/lung disease and depressed mood, treatment options, and the stigmas associated with seeking treatment.   Anatomy & Physiology of the Heart: - Group verbal and written instruction and models provide basic cardiac anatomy and physiology, with the coronary electrical and arterial systems. Review of Valvular disease and Heart Failure   Cardiac Procedures: - Group verbal and written instruction to review commonly prescribed medications for heart disease. Reviews the medication, class of the drug, and side effects. Includes the steps to properly store meds and maintain the prescription regimen. (beta blockers and nitrates)   Cardiac Medications I: - Group verbal and written instruction to review commonly prescribed medications for heart disease. Reviews the medication, class of the drug, and side effects. Includes the steps to properly store meds and maintain the prescription regimen.   Cardiac Medications II: -Group verbal and written instruction to review commonly prescribed medications for heart disease. Reviews the medication, class of the drug, and side effects. (all other drug classes)    Go Sex-Intimacy & Heart Disease, Get SMART - Goal Setting: - Group verbal and written instruction through game format to discuss  heart disease and the return to sexual intimacy. Provides group verbal and written material to discuss and apply goal setting through the application of the S.M.A.R.T. Method.   Other Matters of the Heart: -  Provides group verbal, written materials and models to describe Stable Angina and Peripheral Artery. Includes description of the disease process and treatment options available to the cardiac patient.   Exercise & Equipment Safety: - Individual verbal instruction and demonstration of equipment use and safety with use of the equipment.   Cardiac Rehab from 03/12/2019 in [facility] Cardiac and Pulmonary Rehab  Date  03/12/19  Educator  [facility]  Instruction Review Code  1- Verbalizes Understanding      Infection Prevention: - Provides verbal and written material to individual with discussion of infection control including proper hand washing and proper equipment cleaning during exercise session.   Cardiac Rehab from 03/12/2019 in [facility] Cardiac and Pulmonary Rehab  Date  03/12/19  Educator  [facility]  Instruction Review Code  1- Verbalizes Understanding      Falls Prevention: - Provides verbal and written material to individual with discussion of falls prevention and safety.   Cardiac Rehab from 03/12/2019 in [facility] Cardiac and Pulmonary Rehab  Date  03/12/19  Educator  [facility]  Instruction Review Code  1- Verbalizes Understanding      Diabetes: - Individual verbal and written instruction to review signs/symptoms of diabetes, desired ranges of glucose level fasting, after meals and with exercise. Acknowledge that pre and post exercise glucose checks will be done for 3 sessions at entry of program.   Know Your Numbers and Risk Factors: -Group verbal and written instruction about important numbers in your health.  Discussion of what are risk factors and how they play a role in the disease process.  Review of Cholesterol, Blood Pressure, Diabetes, and BMI and the role they play in your overall health.   Sleep Hygiene: -Provides group verbal and written instruction about how sleep can affect your health.  Define sleep hygiene, discuss sleep cycles and impact of sleep habits. Review good sleep hygiene tips.     Other: -Provides group and verbal instruction on various topics (see comments)   Knowledge Questionnaire Score: Knowledge Questionnaire Score - 03/12/19 0909      Knowledge Questionnaire Score   Pre Score  26/26       Core Components/Risk Factors/Patient Goals at Admission: Personal Goals and Risk Factors at Admission - 03/12/19 0910      Core Components/Risk Factors/Patient Goals on Admission    Weight Management  Yes;Weight Maintenance;Obesity    Intervention  Weight Management: Develop a combined nutrition and exercise program designed to reach desired caloric intake, while maintaining appropriate intake of nutrient and fiber, sodium and fats, and appropriate energy expenditure required for the weight goal.;Weight Management: Provide education and appropriate resources to help participant work on and attain dietary goals.;Weight Management/Obesity: Establish reasonable short term and long term weight goals.;Obesity: Provide education and appropriate resources to help participant work on and attain dietary goals.    Admit Weight  211 lb 3.2 oz (95.8 kg)    Goal Weight: Short Term  205 lb (93 kg)    Goal Weight: Long Term  200 lb (90.7 kg)    Expected Outcomes  Short Term: Continue to assess and modify interventions until short term weight is achieved;Long Term: Adherence to nutrition and physical activity/exercise program aimed toward attainment of established weight goal;Understanding recommendations for meals to include 15-35% energy as protein, 25-35% energy from  fat, 35-60% energy from carbohydrates, less than 266m of dietary cholesterol, 20-35 gm of total fiber daily;Weight Loss: Understanding of general recommendations for a balanced deficit meal plan, which promotes 1-2 lb weight loss per week and includes a negative energy balance of [phone] kcal/d;Understanding of distribution of calorie intake throughout the day with the consumption of 4-5 meals/snacks    Hypertension  Yes     Intervention  Provide education on lifestyle modifcations including regular physical activity/exercise, weight management, moderate sodium restriction and increased consumption of fresh fruit, vegetables, and low fat dairy, alcohol moderation, and smoking cessation.;Monitor prescription use compliance.    Expected Outcomes  Short Term: Continued assessment and intervention until BP is < 140/919mHG in hypertensive participants. < 130/8073mG in hypertensive participants with diabetes, heart failure or chronic kidney disease.;Long Term: Maintenance of blood pressure at goal levels.    Lipids  Yes    Intervention  Provide education and support for participant on nutrition & aerobic/resistive exercise along with prescribed medications to achieve LDL <96m8mDL >40mg53m Expected Outcomes  Short Term: Participant states understanding of desired cholesterol values and is compliant with medications prescribed. Participant is following exercise prescription and nutrition guidelines.;Long Term: Cholesterol controlled with medications as prescribed, with individualized exercise RX and with personalized nutrition plan. Value goals: LDL < 96mg,63m > 40 mg.       Core Components/Risk Factors/Patient Goals Review:    Core Components/Risk Factors/Patient Goals at Discharge (Final Review):    ITP Comments: ITP Comments    Row Name 03/11/19 1515 03/12/19 0905         ITP Comments  Completed virtual orientation today.  [facility] EP eval is scheduled for 2/4 at 8am.  Completed 6MWT and gym orientation.  Initial ITP created and sent for review to Dr. Mark MEmily Filbertcal Director.         Comments:Initial ITP

## 2019-03-12 NOTE — Patient Instructions (Signed)
Patient Instructions  Patient Details  Name: Marcus Roberts MRN: [id number] Date of Birth: 1975-10-11 Referring Provider:  Nelva Bush, MD  Below are your personal goals for exercise, nutrition, and risk factors. Our goal is to help you stay on track towards obtaining and maintaining these goals. We will be discussing your progress on these goals with you throughout the program.  Initial Exercise Prescription: Initial Exercise Prescription - 03/12/19 0900      Date of Initial Exercise RX and Referring Provider   Date  03/12/19    Referring Provider  End, Harrell Gave MD      Treadmill   MPH  2.5    Grade  1    Minutes  15    METs  3.26      Elliptical   Level  1    Speed  3    Minutes  15    METs  3      Prescription Details   Frequency (times per week)  2   3 starting back in March   Duration  Progress to 30 minutes of continuous aerobic without signs/symptoms of physical distress      Intensity   THRR 40-80% of Max Heartrate  114-157    Ratings of Perceived Exertion  11-13    Perceived Dyspnea  0-4      Progression   Progression  Continue to progress workloads to maintain intensity without signs/symptoms of physical distress.      Resistance Training   Training Prescription  Yes    Weight  4 lb    Reps  10-15       Exercise Goals: Frequency: Be able to perform aerobic exercise two to three times per week in program working toward 2-5 days per week of home exercise.  Intensity: Work with a perceived exertion of 11 (fairly light) - 15 (hard) while following your exercise prescription.  We will make changes to your prescription with you as you progress through the program.   Duration: Be able to do 30 to 45 minutes of continuous aerobic exercise in addition to a 5 minute warm-up and a 5 minute cool-down routine.   Nutrition Goals: Your personal nutrition goals will be established when you do your nutrition analysis with the dietician.  The following are  general nutrition guidelines to follow: Cholesterol < 200mg /day Sodium < 1500mg /day Fiber: Men under 50 yrs - 38 grams per day  Personal Goals: Personal Goals and Risk Factors at Admission - 03/12/19 0910      Core Components/Risk Factors/Patient Goals on Admission    Weight Management  Yes;Weight Maintenance;Obesity    Intervention  Weight Management: Develop a combined nutrition and exercise program designed to reach desired caloric intake, while maintaining appropriate intake of nutrient and fiber, sodium and fats, and appropriate energy expenditure required for the weight goal.;Weight Management: Provide education and appropriate resources to help participant work on and attain dietary goals.;Weight Management/Obesity: Establish reasonable short term and long term weight goals.;Obesity: Provide education and appropriate resources to help participant work on and attain dietary goals.    Admit Weight  211 lb 3.2 oz (95.8 kg)    Goal Weight: Short Term  205 lb (93 kg)    Goal Weight: Long Term  200 lb (90.7 kg)    Expected Outcomes  Short Term: Continue to assess and modify interventions until short term weight is achieved;Long Term: Adherence to nutrition and physical activity/exercise program aimed toward attainment of established weight goal;Understanding recommendations for  meals to include 15-35% energy as protein, 25-35% energy from fat, 35-60% energy from carbohydrates, less than 200mg  of dietary cholesterol, 20-35 gm of total fiber daily;Weight Loss: Understanding of general recommendations for a balanced deficit meal plan, which promotes 1-2 lb weight loss per week and includes a negative energy balance of [phone] kcal/d;Understanding of distribution of calorie intake throughout the day with the consumption of 4-5 meals/snacks    Hypertension  Yes    Intervention  Provide education on lifestyle modifcations including regular physical activity/exercise, weight management, moderate sodium  restriction and increased consumption of fresh fruit, vegetables, and low fat dairy, alcohol moderation, and smoking cessation.;Monitor prescription use compliance.    Expected Outcomes  Short Term: Continued assessment and intervention until BP is < 140/66mm HG in hypertensive participants. < 130/34mm HG in hypertensive participants with diabetes, heart failure or chronic kidney disease.;Long Term: Maintenance of blood pressure at goal levels.    Lipids  Yes    Intervention  Provide education and support for participant on nutrition & aerobic/resistive exercise along with prescribed medications to achieve LDL 70mg , HDL >40mg .    Expected Outcomes  Short Term: Participant states understanding of desired cholesterol values and is compliant with medications prescribed. Participant is following exercise prescription and nutrition guidelines.;Long Term: Cholesterol controlled with medications as prescribed, with individualized exercise RX and with personalized nutrition plan. Value goals: LDL < 70mg , HDL > 40 mg.       Tobacco Use Initial Evaluation: Social History   Tobacco Use  Smoking Status Never Smoker  Smokeless Tobacco Never Used    Exercise Goals and Review: Exercise Goals    Row Name 03/12/19 0908             Exercise Goals   Increase Physical Activity  Yes       Intervention  Provide advice, education, support and counseling about physical activity/exercise needs.;Develop an individualized exercise prescription for aerobic and resistive training based on initial evaluation findings, risk stratification, comorbidities and participant's personal goals.       Expected Outcomes  Short Term: Attend rehab on a regular basis to increase amount of physical activity.;Long Term: Add in home exercise to make exercise part of routine and to increase amount of physical activity.;Long Term: Exercising regularly at least 3-5 days a week.       Increase Strength and Stamina  Yes        Intervention  Provide advice, education, support and counseling about physical activity/exercise needs.;Develop an individualized exercise prescription for aerobic and resistive training based on initial evaluation findings, risk stratification, comorbidities and participant's personal goals.       Expected Outcomes  Short Term: Increase workloads from initial exercise prescription for resistance, speed, and METs.;Short Term: Perform resistance training exercises routinely during rehab and add in resistance training at home;Long Term: Improve cardiorespiratory fitness, muscular endurance and strength as measured by increased METs and functional capacity (6MWT)       Able to understand and use rate of perceived exertion (RPE) scale  Yes       Intervention  Provide education and explanation on how to use RPE scale       Expected Outcomes  Short Term: Able to use RPE daily in rehab to express subjective intensity level;Long Term:  Able to use RPE to guide intensity level when exercising independently       Able to understand and use Dyspnea scale  Yes       Intervention  Provide education and  explanation on how to use Dyspnea scale       Expected Outcomes  Short Term: Able to use Dyspnea scale daily in rehab to express subjective sense of shortness of breath during exertion;Long Term: Able to use Dyspnea scale to guide intensity level when exercising independently       Knowledge and understanding of Target Heart Rate Range (THRR)  Yes       Intervention  Provide education and explanation of THRR including how the numbers were predicted and where they are located for reference       Expected Outcomes  Short Term: Able to state/look up THRR;Short Term: Able to use daily as guideline for intensity in rehab;Long Term: Able to use THRR to govern intensity when exercising independently       Able to check pulse independently  Yes       Intervention  Provide education and demonstration on how to check pulse in  carotid and radial arteries.;Review the importance of being able to check your own pulse for safety during independent exercise       Expected Outcomes  Short Term: Able to explain why pulse checking is important during independent exercise;Long Term: Able to check pulse independently and accurately       Understanding of Exercise Prescription  Yes       Intervention  Provide education, explanation, and written materials on patient's individual exercise prescription       Expected Outcomes  Short Term: Able to explain program exercise prescription;Long Term: Able to explain home exercise prescription to exercise independently          Copy of goals given to participant.

## 2019-03-13 ENCOUNTER — Telehealth: Payer: Self-pay | Admitting: Internal Medicine

## 2019-03-13 ENCOUNTER — Encounter: Payer: Self-pay | Admitting: Internal Medicine

## 2019-03-13 NOTE — Telephone Encounter (Signed)
Call pt omeprazole interferes with plavix  Is he agreeable to change to alternative?   [place]

## 2019-03-13 NOTE — Telephone Encounter (Signed)
Called Pt back , and he stated Yes he will change to an alternative medication.

## 2019-03-16 ENCOUNTER — Encounter: Payer: 59 | Admitting: *Deleted

## 2019-03-16 ENCOUNTER — Other Ambulatory Visit: Payer: Self-pay | Admitting: Internal Medicine

## 2019-03-16 ENCOUNTER — Other Ambulatory Visit: Payer: Self-pay

## 2019-03-16 DIAGNOSIS — Z95820 Peripheral vascular angioplasty status with implants and grafts: Secondary | ICD-10-CM

## 2019-03-16 DIAGNOSIS — Z955 Presence of coronary angioplasty implant and graft: Secondary | ICD-10-CM | POA: Diagnosis not present

## 2019-03-16 DIAGNOSIS — K219 Gastro-esophageal reflux disease without esophagitis: Secondary | ICD-10-CM

## 2019-03-16 MED ORDER — PANTOPRAZOLE SODIUM 40 MG PO TBEC: 40.0000 mg | DELAYED_RELEASE_TABLET | Freq: Every day | ORAL | 3 refills | Status: DC

## 2019-03-16 NOTE — Progress Notes (Signed)
Daily Session Note  Patient Details  Name: Marcus Roberts MRN: [id number] Date of Birth: [birth year] Referring Provider:     Cardiac Rehab from 03/12/2019 in [facility]  Referring Provider  End, Harrell Gave MD      Encounter Date: 03/16/2019  Check In: Session Check In - 03/16/19 1549      Check-In   Supervising physician immediately available to respond to emergencies  See telemetry face sheet for immediately available ER MD    Location  [facility]-Cardiac & Pul[facility] Rehab    Staff Present  Earlean Shawl, BS, ACSM CEP, Exercise Physiologist;Liliah Dorian Sherryll Burger, RN BSN;Joseph Tessie Fass RCP,RRT,BSRT    Virtual Visit  No    Fall or balance concerns reported     No    Warm-up and Cool-down  Performed on first and last piece of equipment    Resistance Training Performed  Yes    VAD Patient?  No    PAD/SET Patient?  No      Pain Assessment   Currently in Pain?  No/denies          Social History   Tobacco Use  Smoking Status Never Smoker  Smokeless Tobacco Never Used    Goals Met:  Independence with exercise equipment Exercise tolerated well No report of cardiac concerns or symptoms Strength training completed today  Goals Unmet:  Not Applicable  Comments: First full day of exercise!  Patient was oriented to gym and equipment including functions, settings, policies, and procedures.  Patient's individual exercise prescription and treatment plan were reviewed.  All starting workloads were established based on the results of the 6 minute walk test done at initial orientation visit.  The plan for exercise progression was also introduced and progression will be customized based on patient's performance and goals.    Dr. Emily Filbert is Medical Director for [facility].

## 2019-03-16 NOTE — Progress Notes (Signed)
Completed Initial RD Eval 

## 2019-03-19 ENCOUNTER — Encounter: Payer: 59 | Admitting: *Deleted

## 2019-03-19 ENCOUNTER — Other Ambulatory Visit: Payer: Self-pay

## 2019-03-19 DIAGNOSIS — Z95820 Peripheral vascular angioplasty status with implants and grafts: Secondary | ICD-10-CM

## 2019-03-19 DIAGNOSIS — Z955 Presence of coronary angioplasty implant and graft: Secondary | ICD-10-CM | POA: Diagnosis not present

## 2019-03-19 NOTE — Progress Notes (Signed)
Daily Session Note  Patient Details  Name: Marcus Roberts MRN: [id number] Date of Birth: 1975/12/28 Referring Provider:     Cardiac Rehab from 03/12/2019 in [facility]  Referring Provider  End, Harrell Gave MD      Encounter Date: 03/19/2019  Check In: Session Check In - 03/19/19 1558      Check-In   Supervising physician immediately available to respond to emergencies  See telemetry face sheet for immediately available ER MD    Location  [facility]    Staff Present  Heath Lark, RN, BSN, CCRP;Meredith Flushing, RN BSN;Jessica [place], MA, RCEP, CCRP, CCET;Joseph Hettinger, [place], ACSM CEP, Exercise Physiologist    Virtual Visit  No    Medication changes reported      No    Fall or balance concerns reported     No    Warm-up and Cool-down  Performed on first and last piece of equipment    Resistance Training Performed  Yes    VAD Patient?  No    PAD/SET Patient?  No      Pain Assessment   Currently in Pain?  No/denies          Social History   Tobacco Use  Smoking Status Never Smoker  Smokeless Tobacco Never Used    Goals Met:  Independence with exercise equipment Exercise tolerated well angina symptoms while on TM  Goals Unmet:  Not Applicable  Comments: Some symptoms today   Speed and elevation decreased  Symptoms resolved   Dr. Emily Filbert is Medical Director for [facility].

## 2019-03-23 ENCOUNTER — Encounter: Payer: Self-pay | Admitting: *Deleted

## 2019-03-23 DIAGNOSIS — Z95820 Peripheral vascular angioplasty status with implants and grafts: Secondary | ICD-10-CM

## 2019-03-23 NOTE — Progress Notes (Signed)
Cardiac Individual Treatment Plan  Patient Details  Name: Marcus Roberts MRN: [id number] Date of Birth: 1975-02-20 Referring Provider:     Cardiac Rehab from 03/12/2019 in [facility] Cardiac and Pulmonary Rehab  Referring Provider  End, Christopher MD      Initial Encounter Date:    Cardiac Rehab from 03/12/2019 in [facility] Cardiac and Pulmonary Rehab  Date  03/12/19      Visit Diagnosis: S/P angioplasty with stent  Patient's Home Medications on Admission:  Current Outpatient Medications:  .  amLODipine (NORVASC) 2.5 MG tablet, Take 1 tablet (2.5 mg total) by mouth daily., Disp: 30 tablet, Rfl: 5 .  aspirin EC 81 MG tablet, Take 81 mg by mouth daily. , Disp: , Rfl:  .  atorvastatin (LIPITOR) 80 MG tablet, Take 1 tablet (80 mg total) by mouth daily., Disp: 30 tablet, Rfl: 11 .  clopidogrel (PLAVIX) 75 MG tablet, Take 1 tablet (75 mg total) by mouth daily. Stop Brilinta., Disp: 90 tablet, Rfl: 3 .  cyclobenzaprine (FLEXERIL) 10 MG tablet, Take 10 mg by mouth 2 (two) times daily as needed for muscle spasms. , Disp: , Rfl:  .  fluticasone (FLONASE) 50 MCG/ACT nasal spray, Place 1 spray into both nostrils every evening. , Disp: , Rfl:  .  LORazepam (ATIVAN) 1 MG tablet, Take 0.5 mg by mouth daily., Disp: , Rfl:  .  metoprolol tartrate (LOPRESSOR) 25 MG tablet, Take 0.5 tablets (12.5 mg total) by mouth 2 (two) times daily., Disp: 90 tablet, Rfl: 1 .  Multiple Vitamin (MULTIVITAMIN WITH MINERALS) TABS tablet, Take 1 tablet by mouth 3 (three) times a week., Disp: , Rfl:  .  nitroGLYCERIN (NITROSTAT) 0.4 MG SL tablet, Place 1 tablet (0.4 mg total) under the tongue every 5 (five) minutes as needed for chest pain., Disp: 25 tablet, Rfl: prn .  pantoprazole (PROTONIX) 40 MG tablet, Take 1 tablet (40 mg total) by mouth daily. 30 min before breakfast or Dinner, Disp: 90 tablet, Rfl: 3  Past Medical History: Past Medical History:  Diagnosis Date  . Anxiety   . Aortic atherosclerosis ([place])   . CAD (coronary  artery disease)    a. 12/2018 ETT: Ex time 9:39. 45m horiz ST dep in V4-V6 with abnormal coronary CTA. b. Cath 02/2019 with multivessel CAD s/p orbital atherectomy/DES to prox-mid LAD, EF normal.  . Chest pain   . Depression   . GERD (gastroesophageal reflux disease)   . History of echocardiogram    a. 12/2018 Echo: EF 60-65%, no rwma. Mildly dil LA.  .Marland KitchenHyperlipidemia   . Kidney stones     Tobacco Use: Social History   Tobacco Use  Smoking Status Never Smoker  Smokeless Tobacco Never Used    Labs: Recent Review FScientist, physiological   Labs for ITP Cardiac and Pulmonary Rehab Latest Ref Rng & Units 06/13/2017   Cholestrol 0 - 200 mg/dL 218(H)   LDLCALC 0 - 99 mg/dL 140(H)   HDL >39.00 mg/dL 42.80   Trlycerides 0.0 - 149.0 mg/dL 173.0(H)       Exercise Target Goals: Exercise Program Goal: Individual exercise prescription set using results from initial 6 min walk test and THRR while considering  patient's activity barriers and safety.   Exercise Prescription Goal: Initial exercise prescription builds to 30-45 minutes a day of aerobic activity, 2-3 days per week.  Home exercise guidelines will be given to patient during program as part of exercise prescription that the participant will acknowledge.  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 HR  93 bpm     Max Ex. BP  146/62     2 Minute Post BP  134/64        Oxygen Initial Assessment:   Oxygen Re-Evaluation:   Oxygen Discharge (Final Oxygen Re-Evaluation):   Initial Exercise Prescription: Initial Exercise Prescription - 03/12/19 0900      Date of Initial Exercise RX and Referring Provider   Date  03/12/19    Referring Provider  End, Harrell Gave MD      Treadmill   MPH  2.5    Grade  1    Minutes  15    METs  3.26      Elliptical   Level  1    Speed  3    Minutes  15    METs  3      Prescription Details   Frequency (times per week)  2   3 starting back in March   Duration  Progress to 30 minutes of continuous aerobic without signs/symptoms of physical distress      Intensity   THRR 40-80% of Max Heartrate  114-157    Ratings of Perceived Exertion  11-13    Perceived Dyspnea  0-4      Progression   Progression  Continue to progress workloads to maintain intensity without signs/symptoms of physical distress.      Resistance Training   Training Prescription  Yes    Weight  4 lb    Reps  10-15       Perform Capillary Blood Glucose checks as needed.  Exercise Prescription Changes:  Exercise Prescription Changes    Row Name 03/12/19 0900             Response to Exercise   Blood Pressure (Admit)  136/62       Blood Pressure (Exercise)  146/62       Blood Pressure (Exit)  134/64       Heart Rate (Admit)  71 bpm       Heart Rate (Exercise)  93 bpm       Heart Rate (Exit)  70 bpm       Oxygen Saturation (Admit)  96 %       Oxygen Saturation (Exercise)  99 %       Rating of Perceived  Exertion (Exercise)  7       Symptoms  none       Comments  walk test results          Exercise Comments:  Exercise Comments    Row Name 03/19/19 1610           Exercise Comments  Some symptoms today   Speed and elevation decreased  Symptoms  resolved          Exercise Goals and Review:  Exercise Goals    Row Name 03/12/19 0908             Exercise Goals   Increase Physical Activity  Yes       Intervention  Provide advice, education, support and counseling about physical activity/exercise needs.;Develop an individualized exercise prescription for aerobic and resistive training based on initial evaluation findings, risk stratification, comorbidities and participant's personal goals.       Expected Outcomes  Short Term: Attend rehab on a regular basis to increase amount of physical activity.;Long Term: Add in home exercise to make exercise part of routine and to increase amount of physical activity.;Long Term: Exercising regularly at least 3-5 days a week.       Increase Strength and Stamina  Yes       Intervention  Provide advice, education, support and counseling about physical activity/exercise needs.;Develop an individualized exercise prescription for aerobic and resistive training based on initial evaluation findings, risk stratification, comorbidities and participant's personal goals.       Expected Outcomes  Short Term: Increase workloads from initial exercise prescription for resistance, speed, and METs.;Short Term: Perform resistance training exercises routinely during rehab and add in resistance training at home;Long Term: Improve cardiorespiratory fitness, muscular endurance and strength as measured by increased METs and functional capacity (6MWT)       Able to understand and use rate of perceived exertion (RPE) scale  Yes       Intervention  Provide education and explanation on how to use RPE scale       Expected Outcomes  Short Term: Able to use RPE daily in rehab to express subjective intensity level;Long Term:  Able to use RPE to guide intensity level when exercising independently       Able to understand and use Dyspnea scale  Yes       Intervention  Provide education and explanation on how to use Dyspnea scale        Expected Outcomes  Short Term: Able to use Dyspnea scale daily in rehab to express subjective sense of shortness of breath during exertion;Long Term: Able to use Dyspnea scale to guide intensity level when exercising independently       Knowledge and understanding of Target Heart Rate Range (THRR)  Yes       Intervention  Provide education and explanation of THRR including how the numbers were predicted and where they are located for reference       Expected Outcomes  Short Term: Able to state/look up THRR;Short Term: Able to use daily as guideline for intensity in rehab;Long Term: Able to use THRR to govern intensity when exercising independently       Able to check pulse independently  Yes       Intervention  Provide education and demonstration on how to check pulse in carotid and radial arteries.;Review the importance of being able to check your own pulse for safety during independent exercise       Expected Outcomes  Short  Term: Able to explain why pulse checking is important during independent exercise;Long Term: Able to check pulse independently and accurately       Understanding of Exercise Prescription  Yes       Intervention  Provide education, explanation, and written materials on patient's individual exercise prescription       Expected Outcomes  Short Term: Able to explain program exercise prescription;Long Term: Able to explain home exercise prescription to exercise independently          Exercise Goals Re-Evaluation : Exercise Goals Re-Evaluation    Row Name 03/16/19 1551             Exercise Goal Re-Evaluation   Exercise Goals Review  Increase Physical Activity;Able to understand and use rate of perceived exertion (RPE) scale;Knowledge and understanding of Target Heart Rate Range (THRR);Understanding of Exercise Prescription;Increase Strength and Stamina;Able to check pulse independently       Comments  Reviewed RPE scale, THR and program prescription with pt today.  Pt  voiced understanding and was given a copy of goals to take home.       Expected Outcomes  Short: Use RPE daily to regulate intensity. Long: Follow program prescription in THR.          Discharge Exercise Prescription (Final Exercise Prescription Changes): Exercise Prescription Changes - 03/12/19 0900      Response to Exercise   Blood Pressure (Admit)  136/62    Blood Pressure (Exercise)  146/62    Blood Pressure (Exit)  134/64    Heart Rate (Admit)  71 bpm    Heart Rate (Exercise)  93 bpm    Heart Rate (Exit)  70 bpm    Oxygen Saturation (Admit)  96 %    Oxygen Saturation (Exercise)  99 %    Rating of Perceived Exertion (Exercise)  7    Symptoms  none    Comments  walk test results       Nutrition:  Target Goals: Understanding of nutrition guidelines, daily intake of sodium <1548m, cholesterol <2052m calories 30% from fat and 7% or less from saturated fats, daily to have 5 or more servings of fruits and vegetables.  Biometrics: Pre Biometrics - 03/12/19 0909      Pre Biometrics   Height  5' 7.9" (1.725 m)    Weight  211 lb 3.2 oz (95.8 kg)    BMI (Calculated)  32.19    Single Leg Stand  30 seconds        Nutrition Therapy Plan and Nutrition Goals: Nutrition Therapy & Goals - 03/16/19 1632      Nutrition Therapy   Diet  HH diet    Protein (specify units)  80g    Fiber  30 grams    Whole Grain Foods  3 servings    Saturated Fats  12 max. grams    Fruits and Vegetables  5 servings/day    Sodium  1.5 grams      Personal Nutrition Goals   Nutrition Goal  ST: consitently take multivitamins LT: Lose weight, feel more energized    Comments  Pt reports feeling like Marcus Roberts lost some strength app. Oatmeal with protein powder and apples with peanut butter, or fiber rich cereal with silk almond milk and protein powder and natural peanut butter, or egg whites with one egg with vegetables with olive oil. Salads with pulled chicken (no skin), vinegarette, various vegetables with  salads or leftovers. snacks can of mixed vegetables or pineapple chunks in its  own juices. Pt reports not usually eating bread.      Intervention Plan   Intervention  Prescribe, educate and counsel regarding individualized specific dietary modifications aiming towards targeted core components such as weight, hypertension, lipid management, diabetes, heart failure and other comorbidities.;Nutrition handout(s) given to patient.    Expected Outcomes  Short Term Goal: Understand basic principles of dietary content, such as calories, fat, sodium, cholesterol and nutrients.;Short Term Goal: A plan has been developed with personal nutrition goals set during dietitian appointment.;Long Term Goal: Adherence to prescribed nutrition plan.       Nutrition Assessments: Nutrition Assessments - 03/12/19 0909      MEDFICTS Scores   Pre Score  35       Nutrition Goals Re-Evaluation:   Nutrition Goals Discharge (Final Nutrition Goals Re-Evaluation):   Psychosocial: Target Goals: Acknowledge presence or absence of significant depression and/or stress, maximize coping skills, provide positive support system. Participant is able to verbalize types and ability to use techniques and skills needed for reducing stress and depression.   Initial Review & Psychosocial Screening: Initial Psych Review & Screening - 03/11/19 1446      Initial Review   Current issues with  History of Depression;Current Anxiety/Panic;Current Depression;Current Psychotropic Meds;Current Stress Concerns    Source of Stress Concerns  Family    Comments  History depression and anxiety.  This is the lowest that is has ever been.  No plans to hurt himself.  Marcus Roberts was well managed previously with medications.  Marcus Roberts has reached out to a theapist as well.  Home life is pretty good, some ED issues but very supportive.      Family Dynamics   Good Support System?  Yes   spouse, signed up for therapy as well     Barriers   Psychosocial barriers  to participate in program  The patient should benefit from training in stress management and relaxation.;Psychosocial barriers identified (see note)      Screening Interventions   Interventions  Encouraged to exercise;To provide support and resources with identified psychosocial needs;Provide feedback about the scores to participant    Expected Outcomes  Short Term goal: Utilizing psychosocial counselor, staff and physician to assist with identification of specific Stressors or current issues interfering with healing process. Setting desired goal for each stressor or current issue identified.;Long Term Goal: Stressors or current issues are controlled or eliminated.;Short Term goal: Identification and review with participant of any Quality of Life or Depression concerns found by scoring the questionnaire.;Long Term goal: The participant improves quality of Life and PHQ9 Scores as seen by post scores and/or verbalization of changes       Quality of Life Scores:  Quality of Life - 03/12/19 0909      Quality of Life   Select  Quality of Life      Quality of Life Scores   Health/Function Pre  13.6 %    Socioeconomic Pre  20.57 %    Psych/Spiritual Pre  9.21 %    Family Pre  21.5 %    GLOBAL Pre  15.29 %      Scores of 19 and below usually indicate a poorer quality of life in these areas.  A difference of  2-3 points is a clinically meaningful difference.  A difference of 2-3 points in the total score of the Quality of Life Index has been associated with significant improvement in overall quality of life, self-image, physical symptoms, and general health in studies assessing change in quality  of life.  PHQ-9: Recent Review Flowsheet Data    Depression screen [facility] 2/9 03/12/2019 12/04/2017 06/12/2017   Decreased Interest 2 2 1    Down, Depressed, Hopeless 2 1 2    PHQ - 2 Score 4 3 3    Altered sleeping 2 0 0   Tired, decreased energy 2 1 2    Change in appetite 2 0 2   Feeling bad or failure about  yourself  3 1 3    Trouble concentrating 2 0 1   Moving slowly or fidgety/restless 1 0 1   Suicidal thoughts 0 0 1   PHQ-9 Score 16 5 13    Difficult doing work/chores Somewhat difficult Not difficult at all Somewhat difficult     Interpretation of Total Score  Total Score Depression Severity:  1-4 = Minimal depression, 5-9 = Mild depression, 10-14 = Moderate depression, 15-19 = Moderately severe depression, 20-27 = Severe depression   Psychosocial Evaluation and Intervention: Psychosocial Evaluation - 03/11/19 1502      Psychosocial Evaluation & Interventions   Interventions  Encouraged to exercise with the program and follow exercise prescription;Stress management education    Comments  Marcus Roberts is coming into rehab after having a stent placed.  Marcus Roberts thought his heart was good but then started having anginal symptoms and failed his stress test.  Marcus Roberts was able to get one stent but has two other blockages that are still causing some anginal pain.  Marcus Roberts does have collarterals and this doctor is hoping that rehab will help.  Marcus Roberts was previously very active since his gastric surgery in 2013.  His ultimate goal would be to get back to being able to backpack again.  Marcus Roberts needs to rebuild his stamina and would like to no longer have any pain.  Marcus Roberts is fearful of the thought of by pass surgery.  Marcus Roberts tried to run recently and had more pain, they have adjusted some medications to try to help.  His wife is supportive but also scared.  His depression has also increased since all of this has happended and is at its highest.  Marcus Roberts has sought out a therapist and I encouraged him that sometimes an extra boost in medicine can help get over the hump temporarily but Marcus Roberts is also concerned about the effect anti-depressants have on his libido.    Expected Outcomes  Short: Attend rehab to regain confidence in exericse and reduce symptoms.  Long: Continue to talk to therapist and doctors about mental health concerns.    Continue Psychosocial  Services   Follow up required by staff       Psychosocial Re-Evaluation:   Psychosocial Discharge (Final Psychosocial Re-Evaluation):   Vocational Rehabilitation: Provide vocational rehab assistance to qualifying candidates.   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 Participant will state understanding/return demonstration of topics presented as noted by education test scores.  Learning Barriers/Preferences:   Education Topics:  AED/CPR: - Group verbal and written instruction with the use of models to demonstrate the basic use of the AED with the basic ABC's of resuscitation.   General Nutrition Guidelines/Fats and Fiber: -Group instruction provided by verbal, written material, models and posters to present  the general guidelines for heart healthy nutrition. Gives an explanation and review of dietary fats and fiber.   Controlling Sodium/Reading Food Labels: -Group verbal and written material supporting the discussion of sodium use in heart healthy nutrition. Review and explanation with models, verbal and written materials for utilization of the food label.   Exercise Physiology & General Exercise Guidelines: - Group verbal and written instruction with models to review the exercise physiology of the cardiovascular system and associated critical values. Provides general exercise guidelines with specific guidelines to those with heart or lung disease.    Aerobic Exercise & Resistance Training: - Gives group verbal and written instruction on the various components of exercise. Focuses on aerobic and resistive training  programs and the benefits of this training and how to safely progress through these programs..   Flexibility, Balance, Mind/Body Relaxation: Provides group verbal/written instruction on the benefits of flexibility and balance training, including mind/body exercise modes such as yoga, pilates and tai chi.  Demonstration and skill practice provided.   Stress and Anxiety: - Provides group verbal and written instruction about the health risks of elevated stress and causes of high stress.  Discuss the correlation between heart/lung disease and anxiety and treatment options. Review healthy ways to manage with stress and anxiety.   Depression: - Provides group verbal and written instruction on the correlation between heart/lung disease and depressed mood, treatment options, and the stigmas associated with seeking treatment.   Anatomy & Physiology of the Heart: - Group verbal and written instruction and models provide basic cardiac anatomy and physiology, with the coronary electrical and arterial systems. Review of Valvular disease and Heart Failure   Cardiac Procedures: - Group verbal and written instruction to review commonly prescribed medications for heart disease. Reviews the medication, class of the drug, and side effects. Includes the steps to properly store meds and maintain the prescription regimen. (beta blockers and nitrates)   Cardiac Medications I: - Group verbal and written instruction to review commonly prescribed medications for heart disease. Reviews the medication, class of the drug, and side effects. Includes the steps to properly store meds and maintain the prescription regimen.   Cardiac Medications II: -Group verbal and written instruction to review commonly prescribed medications for heart disease. Reviews the medication, class of the drug, and side effects. (all other drug classes)    Go Sex-Intimacy & Heart Disease, Get SMART - Goal Setting: - Group verbal and  written instruction through game format to discuss heart disease and the return to sexual intimacy. Provides group verbal and written material to discuss and apply goal setting through the application of the S.M.A.R.T. Method.   Other Matters of the Heart: - Provides group verbal, written materials and models to describe Stable Angina and Peripheral Artery. Includes description of the disease process and treatment options available to the cardiac patient.   Exercise & Equipment Safety: - Individual verbal instruction and demonstration of equipment use and safety with use of the equipment.   Cardiac Rehab from 03/12/2019 in [facility] Cardiac and Pulmonary Rehab  Date  03/12/19  Educator  [facility]  Instruction Review Code  1- Verbalizes Understanding      Infection Prevention: - Provides verbal and written material to individual with discussion of infection control including proper hand washing and proper equipment cleaning during exercise session.   Cardiac Rehab from 03/12/2019 in [facility] Cardiac and Pulmonary Rehab  Date  03/12/19  Educator  [facility]  Instruction Review Code  1- Verbalizes Understanding  Falls Prevention: - Provides verbal and written material to individual with discussion of falls prevention and safety.   Cardiac Rehab from 03/12/2019 in [facility] Cardiac and Pulmonary Rehab  Date  03/12/19  Educator  [facility]  Instruction Review Code  1- Verbalizes Understanding      Diabetes: - Individual verbal and written instruction to review signs/symptoms of diabetes, desired ranges of glucose level fasting, after meals and with exercise. Acknowledge that pre and post exercise glucose checks will be done for 3 sessions at entry of program.   Know Your Numbers and Risk Factors: -Group verbal and written instruction about important numbers in your health.  Discussion of what are risk factors and how they play a role in the disease process.  Review of Cholesterol, Blood Pressure, Diabetes, and BMI and  the role they play in your overall health.   Sleep Hygiene: -Provides group verbal and written instruction about how sleep can affect your health.  Define sleep hygiene, discuss sleep cycles and impact of sleep habits. Review good sleep hygiene tips.    Other: -Provides group and verbal instruction on various topics (see comments)   Knowledge Questionnaire Score: Knowledge Questionnaire Score - 03/12/19 0909      Knowledge Questionnaire Score   Pre Score  26/26       Core Components/Risk Factors/Patient Goals at Admission: Personal Goals and Risk Factors at Admission - 03/12/19 0910      Core Components/Risk Factors/Patient Goals on Admission    Weight Management  Yes;Weight Maintenance;Obesity    Intervention  Weight Management: Develop a combined nutrition and exercise program designed to reach desired caloric intake, while maintaining appropriate intake of nutrient and fiber, sodium and fats, and appropriate energy expenditure required for the weight goal.;Weight Management: Provide education and appropriate resources to help participant work on and attain dietary goals.;Weight Management/Obesity: Establish reasonable short term and long term weight goals.;Obesity: Provide education and appropriate resources to help participant work on and attain dietary goals.    Admit Weight  211 lb 3.2 oz (95.8 kg)    Goal Weight: Short Term  205 lb (93 kg)    Goal Weight: Long Term  200 lb (90.7 kg)    Expected Outcomes  Short Term: Continue to assess and modify interventions until short term weight is achieved;Long Term: Adherence to nutrition and physical activity/exercise program aimed toward attainment of established weight goal;Understanding recommendations for meals to include 15-35% energy as protein, 25-35% energy from fat, 35-60% energy from carbohydrates, less than 251m of dietary cholesterol, 20-35 gm of total fiber daily;Weight Loss: Understanding of general recommendations for a  balanced deficit meal plan, which promotes 1-2 lb weight loss per week and includes a negative energy balance of [phone] kcal/d;Understanding of distribution of calorie intake throughout the day with the consumption of 4-5 meals/snacks    Hypertension  Yes    Intervention  Provide education on lifestyle modifcations including regular physical activity/exercise, weight management, moderate sodium restriction and increased consumption of fresh fruit, vegetables, and low fat dairy, alcohol moderation, and smoking cessation.;Monitor prescription use compliance.    Expected Outcomes  Short Term: Continued assessment and intervention until BP is < 140/918mHG in hypertensive participants. < 130/8029mG in hypertensive participants with diabetes, heart failure or chronic kidney disease.;Long Term: Maintenance of blood pressure at goal levels.    Lipids  Yes    Intervention  Provide education and support for participant on nutrition & aerobic/resistive exercise along with prescribed medications to achieve LDL <38m50mDL >  19m.    Expected Outcomes  Short Term: Participant states understanding of desired cholesterol values and is compliant with medications prescribed. Participant is following exercise prescription and nutrition guidelines.;Long Term: Cholesterol controlled with medications as prescribed, with individualized exercise RX and with personalized nutrition plan. Value goals: LDL < 778m HDL > 40 mg.       Core Components/Risk Factors/Patient Goals Review:    Core Components/Risk Factors/Patient Goals at Discharge (Final Review):    ITP Comments: ITP Comments    Row Name 03/11/19 1515 03/12/19 0905 03/16/19 1550 03/16/19 1632 03/19/19 1610   ITP Comments  Completed virtual orientation today.  [facility] EP eval is scheduled for 2/4 at 8am.  Completed 6MWT and gym orientation.  Initial ITP created and sent for review to Dr. MaEmily Filbert Medical Director.  First full day of exercise!  Patient was oriented to gym and equipment including functions, settings, policies, and procedures.  Patient's individual exercise prescription and treatment plan were reviewed.  All starting workloads were established based on the results of the 6 minute walk test done at initial orientation visit.  The plan for exercise progression was also introduced and progression will be customized based on patient's performance and goals.  Completed Initial RD Eval  Some symptoms today   Speed and elevation decreased  Symptoms resolved   Row Name 03/23/19 1510           ITP Comments  [facility] called staff to inform them Marcus Roberts needs to drop due to cost of program.          Comments: discharge ITP

## 2019-04-10 ENCOUNTER — Encounter: Payer: Self-pay | Admitting: Internal Medicine

## 2019-04-23 ENCOUNTER — Other Ambulatory Visit
Admission: RE | Admit: 2019-04-23 | Discharge: 2019-04-23 | Disposition: A | Discharge status: Home / Self Care | Payer: 59 | Source: Ambulatory Visit | Attending: Family | Admitting: Family

## 2019-04-23 ENCOUNTER — Other Ambulatory Visit: Payer: Self-pay

## 2019-04-23 DIAGNOSIS — I25118 Atherosclerotic heart disease of native coronary artery with other forms of angina pectoris: Secondary | ICD-10-CM | POA: Diagnosis present

## 2019-04-23 LAB — HEPATIC FUNCTION PANEL
ALT: 25 U/L (ref 0–44)
AST: 22 U/L (ref 15–41)
Albumin: 4.5 g/dL (ref 3.5–5.0)
Alkaline Phosphatase: 71 U/L (ref 38–126)
Bilirubin, Direct: 0.1 mg/dL (ref 0.0–0.2)
Indirect Bilirubin: 1.1 mg/dL — ABNORMAL HIGH (ref 0.3–0.9)
Total Bilirubin: 1.2 mg/dL (ref 0.3–1.2)
Total Protein: 7.6 g/dL (ref 6.5–8.1)

## 2019-04-23 LAB — LIPID PANEL
Cholesterol: 123 mg/dL (ref 0–200)
HDL: 37 mg/dL — ABNORMAL LOW (ref >40)
LDL Cholesterol: 67 mg/dL (ref 0–99)
Total CHOL/HDL Ratio: 3.3 RATIO
Triglycerides: 96 mg/dL (ref <150)
VLDL: 19 mg/dL (ref 0–40)

## 2019-04-29 ENCOUNTER — Other Ambulatory Visit: Payer: Self-pay

## 2019-04-29 ENCOUNTER — Encounter: Payer: Self-pay | Admitting: Internal Medicine

## 2019-04-29 ENCOUNTER — Ambulatory Visit (INDEPENDENT_AMBULATORY_CARE_PROVIDER_SITE_OTHER): Payer: 59 | Admitting: Internal Medicine

## 2019-04-29 VITALS — BP 114/82 | HR 56 | Ht 66.5 in | Wt 213.0 lb

## 2019-04-29 DIAGNOSIS — E785 Hyperlipidemia, unspecified: Secondary | ICD-10-CM

## 2019-04-29 DIAGNOSIS — N529 Male erectile dysfunction, unspecified: Secondary | ICD-10-CM

## 2019-04-29 DIAGNOSIS — I25118 Atherosclerotic heart disease of native coronary artery with other forms of angina pectoris: Secondary | ICD-10-CM

## 2019-04-29 NOTE — Progress Notes (Signed)
Follow-up Outpatient Visit Date: 04/29/2019  Primary Care Provider: McLean-Scocuzza, Nino Glow, MD [place] [address]  Chief Complaint: Follow-up coronary artery disease  HPI:  Marcus Roberts is a 44 y.o. male with history of coronary artery disease status post PCI to the small/mid LAD in 1/21, GERD, obesity status post gastric sleeve, nephrolithiasis, and anxiety/depression, who presents for follow-up of coronary artery disease.  He was last seen in mid January shortly after PCI to the LAD by Laurann Montana, NP.  He was doing well at that time, though he noted some continued exertional dyspnea and chest burning.  This was different than what he experienced prior to his stent placement he has been participating in cardiac rehab.  He noted continued dyspnea and ultimately requested transitioning from ticagrelor to clopidogrel.  Today, Marcus Roberts reports that he is feeling relatively well.  He still has some mild exertional chest discomfort, especially when he first starts exercising.  He is often able to push through it and then has no further pain.  Overall, his chest pain is much better following PCI to the LAD.  He has not needed to use sublingual nitroglycerin.  He notes some intermittent fatigue and wonders if medications could be contributing to this.  He is also concerned about erectile dysfunction that predates initiation of his cardiac medications but may be a little bit worse.  He denies shortness of breath, palpitations, and lightheadedness.  His breathing is back to baseline after transitioning from ticagrelor to clopidogrel.  --------------------------------------------------------------------------------------------------  Past Medical History:  Diagnosis Date  . Anxiety   . Aortic atherosclerosis ([place])   . CAD (coronary artery disease)    a. 12/2018 ETT: Ex time 9:39. 71mm horiz ST dep in V4-V6 with abnormal coronary CTA. b. Cath 02/2019 with multivessel CAD s/p  orbital atherectomy/DES to prox-mid LAD, EF normal.  . Chest pain   . Depression   . GERD (gastroesophageal reflux disease)   . History of echocardiogram    a. 12/2018 Echo: EF 60-65%, no rwma. Mildly dil LA.  Marland Kitchen Hyperlipidemia   . Kidney stones    Past Surgical History:  Procedure Laterality Date  . CHOLECYSTECTOMY    . CORONARY ATHERECTOMY N/A 02/13/2019   Procedure: CORONARY ATHERECTOMY;  Surgeon: Nelva Bush, MD;  Location: [facility];  Service: Cardiovascular;  Laterality: N/A;  . CORONARY STENT INTERVENTION N/A 02/13/2019   Procedure: CORONARY STENT INTERVENTION;  Surgeon: Nelva Bush, MD;  Location: [facility];  Service: Cardiovascular;  Laterality: N/A;  . INTRAVASCULAR PRESSURE WIRE/FFR STUDY N/A 02/13/2019   Procedure: INTRAVASCULAR PRESSURE WIRE/FFR STUDY;  Surgeon: Nelva Bush, MD;  Location: [facility];  Service: Cardiovascular;  Laterality: N/A;  . INTRAVASCULAR ULTRASOUND/IVUS N/A 02/13/2019   Procedure: Intravascular Ultrasound/IVUS;  Surgeon: Nelva Bush, MD;  Location: [facility];  Service: Cardiovascular;  Laterality: N/A;  . LEFT HEART CATH AND CORONARY ANGIOGRAPHY N/A 02/13/2019   Procedure: LEFT HEART CATH AND CORONARY ANGIOGRAPHY;  Surgeon: Nelva Bush, MD;  Location: [facility];  Service: Cardiovascular;  Laterality: N/A;  . vertical sleeve gastrectomy     2013 85% stomach removed was 280 lbs before surgery     Current Meds  Medication Sig  . amLODipine (NORVASC) 2.5 MG tablet Take 1 tablet (2.5 mg total) by mouth daily.  Marland Kitchen aspirin EC 81 MG tablet Take 81 mg by mouth daily.   Marland Kitchen atorvastatin (LIPITOR) 80 MG tablet Take 1 tablet (80 mg total)  by mouth daily.  . clopidogrel (PLAVIX) 75 MG tablet Take 1 tablet (75 mg total) by mouth daily. Stop Brilinta.  . cyclobenzaprine (FLEXERIL) 10 MG tablet Take 10 mg by mouth 2 (two) times daily as needed for muscle spasms.   . fluticasone (FLONASE) 50 MCG/ACT nasal  spray Place 1 spray into both nostrils every evening.   Marland Kitchen LORazepam (ATIVAN) 1 MG tablet Take 0.5 mg by mouth daily.  . Multiple Vitamin (MULTIVITAMIN WITH MINERALS) TABS tablet Take 1 tablet by mouth 3 (three) times a week.  . nitroGLYCERIN (NITROSTAT) 0.4 MG SL tablet Place 1 tablet (0.4 mg total) under the tongue every 5 (five) minutes as needed for chest pain.  . pantoprazole (PROTONIX) 40 MG tablet Take 1 tablet (40 mg total) by mouth daily. 30 min before breakfast or Dinner  . [DISCONTINUED] metoprolol tartrate (LOPRESSOR) 25 MG tablet Take 0.5 tablets (12.5 mg total) by mouth 2 (two) times daily.    Allergies: Imdur [isosorbide nitrate]  Social History   Tobacco Use  . Smoking status: Never Smoker  . Smokeless tobacco: Never Used  Substance Use Topics  . Alcohol use: Yes    Comment: occas. 2-3 x per year   . Drug use: Never    Family History  Problem Relation Age of Onset  . Diabetes Father   . Heart disease Father        x2  . Hyperlipidemia Father   . Hypertension Father   . Heart attack Father 44       x2  . Heart disease Brother        MI in 19s  . Heart attack Brother 40    Review of Systems: A 12-system review of systems was performed and was negative except as noted in the HPI.  --------------------------------------------------------------------------------------------------  Physical Exam: BP 114/82 (BP Location: Left Arm, Patient Position: Sitting, Cuff Size: Normal)   Pulse (!) 56   Ht 5' 6.5" (1.689 m)   Wt 213 lb (96.6 kg)   SpO2 99%   BMI 33.86 kg/m   General: NAD. HEENT: No conjunctival pallor or scleral icterus. Facemask in place. Neck: Supple without lymphadenopathy, thyromegaly, JVD, or HJR. Lungs: Normal work of breathing. Clear to auscultation bilaterally without wheezes or crackles. Heart: Regular rate and rhythm without murmurs, rubs, or gallops. Abd: Bowel sounds present. Soft, NT/ND. Ext: No lower extremity edema. Radial, PT, and  DP pulses are 2+ bilaterally.  EKG: Sinus bradycardia (heart rate 56 bpm) with sinus arrhythmia.  No significant abnormality.  Lab Results  Component Value Date   WBC 8.6 02/14/2019   HGB 14.7 02/14/2019   HCT 41.8 02/14/2019   MCV 88.7 02/14/2019   PLT 177 02/14/2019    Lab Results  Component Value Date   NA 141 02/14/2019   K 3.6 02/14/2019   CL 106 02/14/2019   CO2 25 02/14/2019   BUN 9 02/14/2019   CREATININE 0.82 02/14/2019   GLUCOSE 99 02/14/2019   ALT 25 04/23/2019    Lab Results  Component Value Date   CHOL 123 04/23/2019   HDL 37 (L) 04/23/2019   LDLCALC 67 04/23/2019   TRIG 96 04/23/2019   CHOLHDL 3.3 04/23/2019    --------------------------------------------------------------------------------------------------  ASSESSMENT AND PLAN: Coronary artery disease with stable angina: Marcus Roberts is notably better following PCI to the LAD but still has some residual exertional chest discomfort.  I suspect this is due to disease involving the distal LCx and RCA.  CTO of the RCA continuation  and LCx branches is not well suited to PCI.  We will try to optimize medical therapy.  As a first step, we will stop metoprolol to see if this helps improve his energy.  If he continues to have some chest discomfort, I would favor escalation of amlodipine to 5 mg daily (he wished to do this in a stepwise fashion).  Marcus Roberts is been intolerant of long-acting nitrates in the past.  He will remain on dual antiplatelet therapy with aspirin and clopidogrel for at least 6 months from the time of his intervention, though I favor continuing this longer if tolerated.  Hyperlipidemia: Continue high intensity statin therapy.  LDL was at goal on most recent check a week ago (67).  Erectile dysfunction: This has been a longstanding issue but may be a little bit worse over the last several months.  We discussed etiology of erectile dysfunction, including vascular disease and medications.  We have  agreed to discontinue metoprolol to see if this helps.  I think it is reasonable for Marcus Roberts to continue using sildenafil as needed.  Follow-up: Return to clinic in 3 months.  Nelva Bush, MD 04/30/2019 6:34 AM

## 2019-04-29 NOTE — Patient Instructions (Signed)
Please send [place] a MyChart message after a week or so to update [place] on how you are feeling off the metoprolol.   Medication Instructions:  Your physician has recommended you make the following change in your medication:  1- STOP Metoprolol.  *If you need a refill on your cardiac medications before your next appointment, please call your pharmacy*   Lab Work: none If you have labs (blood work) drawn today and your tests are completely normal, you will receive your results only by: Marland Kitchen MyChart Message (if you have MyChart) OR . A paper copy in the mail If you have any lab test that is abnormal or we need to change your treatment, we will call you to review the results.   Testing/Procedures: none   Follow-Up: At [facility], you and your health needs are our priority.  As part of our continuing mission to provide you with exceptional heart care, we have created designated Provider Care Teams.  These Care Teams include your primary Cardiologist (physician) and Advanced Practice Providers (APPs -  Physician Assistants and Nurse Practitioners) who all work together to provide you with the care you need, when you need it.  We recommend signing up for the patient portal called "MyChart".  Sign up information is provided on this After Visit Summary.  MyChart is used to connect with patients for Virtual Visits (Telemedicine).  Patients are able to view lab/test results, encounter notes, upcoming appointments, etc.  Non-urgent messages can be sent to your provider as well.   To learn more about what you can do with MyChart, go to [email or web address].    Your next appointment:   3 month(s)  The format for your next appointment:   In Person  Provider:    You may see Nelva Bush, MD or one of the following Advanced Practice Providers on your designated Care Team:    Murray Hodgkins, NP  Christell Faith, PA-C  Marrianne Mood, PA-C

## 2019-05-19 ENCOUNTER — Other Ambulatory Visit: Payer: Self-pay | Admitting: Internal Medicine

## 2019-05-19 DIAGNOSIS — F419 Anxiety disorder, unspecified: Secondary | ICD-10-CM

## 2019-05-19 MED ORDER — LORAZEPAM 1 MG PO TABS: 1.0000 mg | ORAL_TABLET | Freq: Every day | ORAL | 2 refills | Status: DC | PRN

## 2019-05-30 ENCOUNTER — Other Ambulatory Visit: Payer: Self-pay | Admitting: Internal Medicine

## 2019-06-09 ENCOUNTER — Encounter: Payer: Self-pay | Admitting: Urology

## 2019-06-10 ENCOUNTER — Telehealth: Payer: Self-pay | Admitting: *Deleted

## 2019-06-10 NOTE — Telephone Encounter (Signed)
This is an option. He has an appointment with me 5/26 and can discuss at that visit. If he wants to try to get something started earlier would make an appointment with [facility]     Left message on phone

## 2019-06-29 ENCOUNTER — Ambulatory Visit: Payer: 59 | Admitting: Urology

## 2019-06-29 ENCOUNTER — Other Ambulatory Visit: Payer: Self-pay

## 2019-06-29 VITALS — BP 142/84 | HR 67 | Ht 66.0 in | Wt 210.0 lb

## 2019-06-29 DIAGNOSIS — R399 Unspecified symptoms and signs involving the genitourinary system: Secondary | ICD-10-CM | POA: Diagnosis not present

## 2019-06-29 LAB — BLADDER SCAN AMB NON-IMAGING: Scan Result: 0

## 2019-06-29 MED ORDER — TADALAFIL 20 MG PO TABS: ORAL_TABLET | ORAL | 0 refills | Status: DC

## 2019-06-29 NOTE — Progress Notes (Signed)
06/29/2019 3:20 PM   Marcus Roberts 1975/09/16 [id number]  Referring provider: McLean-Scocuzza, Nino Glow, MD [place],  [place] [address]  Chief Complaint  Patient presents with  . Other    HPI: 44 y.o. male presents for follow-up of lower urinary tract symptoms and ED.  -Since last visit discontinue tamsulosin as he thought this worsened his voiding pattern -Some decreased force and caliber of his urinary stream since last visit -Diagnosed with coronary artery disease 12/2018 and underwent atherectomy/DES -On dual antiplatelet therapy -Remains on sildenafil though not as effective -Inquired about a trial of tadalafil   PMH: Past Medical History:  Diagnosis Date  . Anxiety   . Aortic atherosclerosis ([place])   . CAD (coronary artery disease)    a. 12/2018 ETT: Ex time 9:39. 61mm horiz ST dep in V4-V6 with abnormal coronary CTA. b. Cath 02/2019 with multivessel CAD s/p orbital atherectomy/DES to prox-mid LAD, EF normal.  . Chest pain   . Depression   . GERD (gastroesophageal reflux disease)   . History of echocardiogram    a. 12/2018 Echo: EF 60-65%, no rwma. Mildly dil LA.  Marland Kitchen Hyperlipidemia   . Kidney stones     Surgical History: Past Surgical History:  Procedure Laterality Date  . CHOLECYSTECTOMY    . CORONARY ATHERECTOMY N/A 02/13/2019   Procedure: CORONARY ATHERECTOMY;  Surgeon: Nelva Bush, MD;  Location: [facility];  Service: Cardiovascular;  Laterality: N/A;  . CORONARY STENT INTERVENTION N/A 02/13/2019   Procedure: CORONARY STENT INTERVENTION;  Surgeon: Nelva Bush, MD;  Location: [facility];  Service: Cardiovascular;  Laterality: N/A;  . INTRAVASCULAR PRESSURE WIRE/FFR STUDY N/A 02/13/2019   Procedure: INTRAVASCULAR PRESSURE WIRE/FFR STUDY;  Surgeon: Nelva Bush, MD;  Location: [facility];  Service: Cardiovascular;  Laterality: N/A;  . INTRAVASCULAR ULTRASOUND/IVUS N/A 02/13/2019   Procedure: Intravascular  Ultrasound/IVUS;  Surgeon: Nelva Bush, MD;  Location: [facility];  Service: Cardiovascular;  Laterality: N/A;  . LEFT HEART CATH AND CORONARY ANGIOGRAPHY N/A 02/13/2019   Procedure: LEFT HEART CATH AND CORONARY ANGIOGRAPHY;  Surgeon: Nelva Bush, MD;  Location: [facility];  Service: Cardiovascular;  Laterality: N/A;  . vertical sleeve gastrectomy     2013 85% stomach removed was 280 lbs before surgery     Home Medications:  Allergies as of 06/29/2019      Reactions   Imdur [isosorbide Nitrate]    Did not tolerate due to headache      Medication List       Accurate as of Jun 29, 2019  3:20 PM. If you have any questions, ask your nurse or doctor.        amLODipine 2.5 MG tablet Commonly known as: NORVASC Take 1 tablet (2.5 mg total) by mouth daily.   aspirin EC 81 MG tablet Take 81 mg by mouth daily.   atorvastatin 80 MG tablet Commonly known as: LIPITOR Take 1 tablet (80 mg total) by mouth daily.   clopidogrel 75 MG tablet Commonly known as: PLAVIX Take 1 tablet (75 mg total) by mouth daily. Stop Brilinta.   cyclobenzaprine 10 MG tablet Commonly known as: FLEXERIL Take 10 mg by mouth 2 (two) times daily as needed for muscle spasms.   fluticasone 50 MCG/ACT nasal spray Commonly known as: FLONASE Place 1 spray into both nostrils every evening.   LORazepam 1 MG tablet Commonly known as: ATIVAN Take 1 tablet (1 mg total) by mouth daily as needed for  anxiety.   multivitamin with minerals Tabs tablet Take 1 tablet by mouth 3 (three) times a week.   nitroGLYCERIN 0.4 MG SL tablet Commonly known as: Nitrostat Place 1 tablet (0.4 mg total) under the tongue every 5 (five) minutes as needed for chest pain.   pantoprazole 40 MG tablet Commonly known as: Protonix Take 1 tablet (40 mg total) by mouth daily. 30 min before breakfast or Dinner       Allergies:  Allergies  Allergen Reactions  . Imdur [Isosorbide Nitrate]     Did not tolerate due  to headache    Family History: Family History  Problem Relation Age of Onset  . Diabetes Father   . Heart disease Father        x2  . Hyperlipidemia Father   . Hypertension Father   . Heart attack Father 42       x2  . Heart disease Brother        MI in 51s  . Heart attack Brother 60    Social History:  reports that he has never smoked. He has never used smokeless tobacco. He reports current alcohol use. He reports that he does not use drugs.   Physical Exam: BP (!) 142/84   Pulse 67   Ht 5\' 6"  (1.676 m)   Wt 210 lb (95.3 kg)   BMI 33.89 kg/m   Constitutional:  Alert and oriented, No acute distress. HEENT: [place] AT, moist mucus membranes.  Trachea midline, no masses. Cardiovascular: No clubbing, cyanosis, or edema. Respiratory: Normal respiratory effort, no increased work of breathing. Skin: No rashes, bruises or suspicious lesions. Neurologic: Grossly intact, no focal deficits, moving all 4 extremities. Psychiatric: Normal mood and affect.   Assessment & Plan:    1.  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  Lower urinary tract symptoms -PVR by bladder scan 0 mL -He does not desire any additional treatment at this time and states his voiding symptoms are not that bothersome. -Recommend cystoscopy for worsening voiding symptoms to evaluate for the possibility of urethral stricture   Abbie Sons, MD  [place] [address], [place], [place] [address] [phone]

## 2019-06-30 ENCOUNTER — Encounter: Payer: Self-pay | Admitting: Urology

## 2019-07-01 ENCOUNTER — Ambulatory Visit: Payer: Self-pay | Admitting: Urology

## 2019-07-31 ENCOUNTER — Encounter: Payer: Self-pay | Admitting: Internal Medicine

## 2019-07-31 NOTE — Telephone Encounter (Signed)
Left message to call back  

## 2019-08-05 ENCOUNTER — Encounter: Payer: Self-pay | Admitting: Internal Medicine

## 2019-08-05 ENCOUNTER — Ambulatory Visit: Payer: 59 | Admitting: Internal Medicine

## 2019-08-05 ENCOUNTER — Other Ambulatory Visit: Payer: Self-pay

## 2019-08-05 VITALS — BP 126/80 | HR 69 | Ht 67.0 in | Wt 213.4 lb

## 2019-08-05 DIAGNOSIS — E785 Hyperlipidemia, unspecified: Secondary | ICD-10-CM

## 2019-08-05 DIAGNOSIS — K219 Gastro-esophageal reflux disease without esophagitis: Secondary | ICD-10-CM | POA: Diagnosis not present

## 2019-08-05 DIAGNOSIS — I25118 Atherosclerotic heart disease of native coronary artery with other forms of angina pectoris: Secondary | ICD-10-CM | POA: Diagnosis not present

## 2019-08-05 DIAGNOSIS — R5383 Other fatigue: Secondary | ICD-10-CM

## 2019-08-05 MED ORDER — AMLODIPINE BESYLATE 5 MG PO TABS: 5.0000 mg | ORAL_TABLET | Freq: Every day | ORAL | 1 refills | Status: DC

## 2019-08-05 NOTE — Progress Notes (Signed)
Follow-up Outpatient Visit Date: 08/05/2019  Primary Care Provider: McLean-Scocuzza, Nino Glow, MD [place] [id number]  Chief Complaint: Follow-up coronary artery disease  HPI:  Marcus Roberts is a 44 y.o. male with history of coronary artery disease status post PCI to the proximal/mid LAD in 1/21, GERD, obesity status post gastric sleeve, nephrolithiasis, and anxiety/depression, who presents for follow-up of coronary artery disease.  I last saw Marcus Roberts in late March, at which time he was doing well.  He still noted some mild exertional chest discomfort when he first began exercising, though it would typically resolve after "pushing through."  Overall, he felt much better compared to before his PCI.  He was also concerned about fatigue and erectile dysfunction, prompting [place] to discontinue metoprolol.  Today, Marcus Roberts reports feeling about the same as our last visit.  He did not notice any significant improvement in his energy since stopping metoprolol.  In fact, he feels like it may have dropped even more.  He notes that he was diagnosed with obstructive sleep apnea while living in [place] about 10 years ago.  He has not been using CPAP.  He continues to have some transient chest discomfort when he first starts exercising, though this usually resolves as he "pushes through" continues to walk.  It is no worse than at our last visit.  He has not had any chest pain at rest nor does he complain of shortness of breath, palpitations, or lightheadedness.  [name] inquires about the safety of going white water kayaking this summer.  --------------------------------------------------------------------------------------------------  Past Medical History:  Diagnosis Date  . Anxiety   . Aortic atherosclerosis ([place])   . CAD (coronary artery disease)    a. 12/2018 ETT: Ex time 9:39. 98mm horiz ST dep in V4-V6 with abnormal coronary CTA. b. Cath 02/2019 with multivessel CAD s/p orbital  atherectomy/DES to prox-mid LAD, EF normal.  . Chest pain   . Depression   . GERD (gastroesophageal reflux disease)   . History of echocardiogram    a. 12/2018 Echo: EF 60-65%, no rwma. Mildly dil LA.  Marland Kitchen Hyperlipidemia   . Kidney stones    Past Surgical History:  Procedure Laterality Date  . CHOLECYSTECTOMY    . CORONARY ATHERECTOMY N/A 02/13/2019   Procedure: CORONARY ATHERECTOMY;  Surgeon: Nelva Bush, MD;  Location: [facility];  Service: Cardiovascular;  Laterality: N/A;  . CORONARY STENT INTERVENTION N/A 02/13/2019   Procedure: CORONARY STENT INTERVENTION;  Surgeon: Nelva Bush, MD;  Location: [facility];  Service: Cardiovascular;  Laterality: N/A;  . INTRAVASCULAR PRESSURE WIRE/FFR STUDY N/A 02/13/2019   Procedure: INTRAVASCULAR PRESSURE WIRE/FFR STUDY;  Surgeon: Nelva Bush, MD;  Location: [facility];  Service: Cardiovascular;  Laterality: N/A;  . INTRAVASCULAR ULTRASOUND/IVUS N/A 02/13/2019   Procedure: Intravascular Ultrasound/IVUS;  Surgeon: Nelva Bush, MD;  Location: [facility];  Service: Cardiovascular;  Laterality: N/A;  . LEFT HEART CATH AND CORONARY ANGIOGRAPHY N/A 02/13/2019   Procedure: LEFT HEART CATH AND CORONARY ANGIOGRAPHY;  Surgeon: Nelva Bush, MD;  Location: [facility];  Service: Cardiovascular;  Laterality: N/A;  . vertical sleeve gastrectomy     2013 85% stomach removed was 280 lbs before surgery     Current Meds  Medication Sig  . amLODipine (NORVASC) 2.5 MG tablet Take 1 tablet (2.5 mg total) by mouth daily.  Marland Kitchen aspirin EC 81 MG tablet Take 81 mg by mouth daily.   Marland Kitchen atorvastatin (LIPITOR) 80  MG tablet Take 1 tablet (80 mg total) by mouth daily.  . clopidogrel (PLAVIX) 75 MG tablet Take 1 tablet (75 mg total) by mouth daily. Stop Brilinta.  . cyclobenzaprine (FLEXERIL) 10 MG tablet Take 10 mg by mouth 2 (two) times daily as needed for muscle spasms.   . fluticasone (FLONASE) 50 MCG/ACT nasal spray Place  1 spray into both nostrils every evening.   Marland Kitchen LORazepam (ATIVAN) 1 MG tablet Take 1 tablet (1 mg total) by mouth daily as needed for anxiety.  . Multiple Vitamin (MULTIVITAMIN WITH MINERALS) TABS tablet Take 1 tablet by mouth 3 (three) times a week.  . nitroGLYCERIN (NITROSTAT) 0.4 MG SL tablet Place 1 tablet (0.4 mg total) under the tongue every 5 (five) minutes as needed for chest pain.  . pantoprazole (PROTONIX) 40 MG tablet Take 1 tablet (40 mg total) by mouth daily. 30 min before breakfast or Dinner  . tadalafil (CIALIS) 20 MG tablet 1 tab 1 hour prior to intercourse.  Do not take nitroglycerin within 36 hours of taking tadalafil    Allergies: Imdur [isosorbide nitrate]  Social History   Tobacco Use  . Smoking status: Never Smoker  . Smokeless tobacco: Never Used  Vaping Use  . Vaping Use: Never used  Substance Use Topics  . Alcohol use: Yes    Comment: occas. 2-3 x per year   . Drug use: Never    Family History  Problem Relation Age of Onset  . Diabetes Father   . Heart disease Father        x2  . Hyperlipidemia Father   . Hypertension Father   . Heart attack Father 61       x2  . Heart disease Brother        MI in 86s  . Heart attack Brother 40    Review of Systems: Marcus Roberts reports persistent acid reflux despite being on pantoprazole.  He also notes that the chest pain described in the HPI seems to worsen when he exerts himself shortly after eating.  Otherwise, a 12-system review of systems was performed and was negative except as noted in the HPI.  --------------------------------------------------------------------------------------------------  Physical Exam: BP 126/80 (BP Location: Left Arm, Patient Position: Sitting, Cuff Size: Normal)   Pulse 69   Ht 5\' 7"  (1.702 m)   Wt 213 lb 6 oz (96.8 kg)   SpO2 99%   BMI 33.42 kg/m   General: NAD. HEENT: No conjunctival pallor or scleral icterus. Facemask in place. Neck: No JVD or HJR. Lungs: Normal work of  breathing. Clear to auscultation bilaterally without wheezes or crackles. Heart: Regular rate and rhythm without murmurs, rubs, or gallops. Abd: Bowel sounds present. Soft, NT/ND without hepatosplenomegaly Ext: No lower extremity edema.  EKG: Normal sinus rhythm without abnormality.  Lab Results  Component Value Date   WBC 8.6 02/14/2019   HGB 14.7 02/14/2019   HCT 41.8 02/14/2019   MCV 88.7 02/14/2019   PLT 177 02/14/2019    Lab Results  Component Value Date   NA 141 02/14/2019   K 3.6 02/14/2019   CL 106 02/14/2019   CO2 25 02/14/2019   BUN 9 02/14/2019   CREATININE 0.82 02/14/2019   GLUCOSE 99 02/14/2019   ALT 25 04/23/2019    Lab Results  Component Value Date   CHOL 123 04/23/2019   HDL 37 (L) 04/23/2019   LDLCALC 67 04/23/2019   TRIG 96 04/23/2019   CHOLHDL 3.3 04/23/2019    --------------------------------------------------------------------------------------------------  ASSESSMENT  AND PLAN: Coronary artery disease with stable angina: Mr. Brayboy continues to have some transient chest discomfort when he first starts to exercise, though it resolves as he continues to walk.  It is no worse than at prior visits and is consistent with CCS class II angina.  It certainly could be related to his on revascularized coronary disease consisting of chronic total occlusions of the distal rPL system and OM2.  We have agreed to increase amlodipine to 5 mg daily for additional antianginal therapy.  Mr. Rosencrans wishes to defer retrial of a beta-blocker at this time, given his ongoing (though it did not improve with cessation of metoprolol).  Given erectile dysfunction and tadalafil use, long-acting nitrates are precluded.  If systems persist, I would advocate for a retrial of beta-blockade and/or addition of ranolazine.  Given stable symptoms, I think it is reasonable for Mr. Joshua to participate in white water kayaking.  I encouraged him to be particularly cautious to avoid injury, given  that he remains on dual antiplatelet therapy with aspirin and clopidogrel.  Hyperlipidemia: LDL well controlled.  Continue atorvastatin 80 mg daily.  GERD: Mr. Woodbury notes suboptimally controlled acid reflux despite being on pantoprazole.  I wonder if some of his chest pain may be GI in nature, particularly since his exertional chest discomfort seems to be worse if he exercises after eating.  I encouraged Mr. Cureton to speak with his PCP about GI referral.  He may benefit from endoscopy given his history of bariatric surgery.  Fatigue: This has been present for months and did not improved with discontinuation of metoprolol.  Given history of sleep apnea diagnosed 10 years ago, and it is possible that worsening OSA may be contributing.  We discussed referral for sleep study, though Mr. Szymborski wishes to discuss this with his PCP first.  Follow-up: Return to clinic in 3 months.  Nelva Bush, MD 08/05/2019 3:55 PM

## 2019-08-05 NOTE — Patient Instructions (Signed)
Medication Instructions:  Your physician has recommended you make the following change in your medication:  1- INCREASE Amlodipine to 5 mg by mouth once a day.  *If you need a refill on your cardiac medications before your next appointment, please call your pharmacy*  Follow-Up: At [facility], you and your health needs are our priority.  As part of our continuing mission to provide you with exceptional heart care, we have created designated Provider Care Teams.  These Care Teams include your primary Cardiologist (physician) and Advanced Practice Providers (APPs -  Physician Assistants and Nurse Practitioners) who all work together to provide you with the care you need, when you need it.  We recommend signing up for the patient portal called "MyChart".  Sign up information is provided on this After Visit Summary.  MyChart is used to connect with patients for Virtual Visits (Telemedicine).  Patients are able to view lab/test results, encounter notes, upcoming appointments, etc.  Non-urgent messages can be sent to your provider as well.   To learn more about what you can do with MyChart, go to [email or web address].    Your next appointment:   3 month(s)  The format for your next appointment:   In Person  Provider:    You may see Nelva Bush, MD or one of the following Advanced Practice Providers on your designated Care Team:    Murray Hodgkins, NP  Christell Faith, PA-C  Marrianne Mood, PA-C

## 2019-08-06 ENCOUNTER — Encounter: Payer: Self-pay | Admitting: Internal Medicine

## 2019-08-06 DIAGNOSIS — R5383 Other fatigue: Secondary | ICD-10-CM | POA: Insufficient documentation

## 2019-08-31 ENCOUNTER — Telehealth: Payer: Self-pay | Admitting: Internal Medicine

## 2019-08-31 NOTE — Telephone Encounter (Signed)
See duplicate encounter for further detail.

## 2019-08-31 NOTE — Telephone Encounter (Signed)
Attempted to call patient. LMTCB 08/31/2019   

## 2019-08-31 NOTE — Telephone Encounter (Signed)
Returned call to patient to discuss chest discomfort.   He reports that after rest chest discomfort is resolved and he has no current pain. He did not take NTG today after encounter this morning because pain resolved with rest.   Pt wanting to know what the plan is going forward.   I suggested since it was discussed in last visit that if pain continued management may need to be changed, he may need to come in for office visit.   Pt agreeable and appt made for tomorrow afternoon.   Urged pt to seek medical care if pain changes or worsens prior to OV>   Advised pt to call for any further questions or concerns.

## 2019-08-31 NOTE — Telephone Encounter (Signed)
Patient states he is returning a call regarding his my chart message.

## 2019-09-01 ENCOUNTER — Encounter: Payer: Self-pay | Admitting: Physician Assistant

## 2019-09-01 ENCOUNTER — Ambulatory Visit
Admission: RE | Admit: 2019-09-01 | Discharge: 2019-09-01 | Disposition: A | Discharge status: Home / Self Care | Payer: 59 | Attending: Physician Assistant | Admitting: Physician Assistant

## 2019-09-01 ENCOUNTER — Ambulatory Visit
Admission: RE | Admit: 2019-09-01 | Discharge: 2019-09-01 | Disposition: A | Discharge status: Home / Self Care | Payer: 59 | Source: Ambulatory Visit | Attending: Physician Assistant | Admitting: Physician Assistant

## 2019-09-01 ENCOUNTER — Other Ambulatory Visit
Admission: RE | Admit: 2019-09-01 | Discharge: 2019-09-01 | Disposition: A | Discharge status: Home / Self Care | Payer: 59 | Source: Home / Self Care | Attending: *Deleted | Admitting: *Deleted

## 2019-09-01 ENCOUNTER — Other Ambulatory Visit: Payer: Self-pay

## 2019-09-01 ENCOUNTER — Ambulatory Visit: Payer: 59 | Admitting: Physician Assistant

## 2019-09-01 VITALS — BP 116/78 | HR 67 | Ht 67.0 in | Wt 213.6 lb

## 2019-09-01 DIAGNOSIS — E785 Hyperlipidemia, unspecified: Secondary | ICD-10-CM

## 2019-09-01 DIAGNOSIS — R0609 Other forms of dyspnea: Secondary | ICD-10-CM

## 2019-09-01 DIAGNOSIS — I25118 Atherosclerotic heart disease of native coronary artery with other forms of angina pectoris: Secondary | ICD-10-CM

## 2019-09-01 DIAGNOSIS — R079 Chest pain, unspecified: Secondary | ICD-10-CM | POA: Diagnosis not present

## 2019-09-01 DIAGNOSIS — R0689 Other abnormalities of breathing: Secondary | ICD-10-CM | POA: Diagnosis present

## 2019-09-01 DIAGNOSIS — R0600 Dyspnea, unspecified: Secondary | ICD-10-CM

## 2019-09-01 DIAGNOSIS — R5383 Other fatigue: Secondary | ICD-10-CM

## 2019-09-01 DIAGNOSIS — K219 Gastro-esophageal reflux disease without esophagitis: Secondary | ICD-10-CM

## 2019-09-01 DIAGNOSIS — R0602 Shortness of breath: Secondary | ICD-10-CM

## 2019-09-01 DIAGNOSIS — N529 Male erectile dysfunction, unspecified: Secondary | ICD-10-CM

## 2019-09-01 LAB — BASIC METABOLIC PANEL
Anion gap: 9 (ref 5–15)
BUN: 20 mg/dL (ref 6–20)
CO2: 28 mmol/L (ref 22–32)
Calcium: 9.2 mg/dL (ref 8.9–10.3)
Chloride: 104 mmol/L (ref 98–111)
Creatinine, Ser: 0.94 mg/dL (ref 0.61–1.24)
GFR calc Af Amer: >60 mL/min (ref >60)
GFR calc non Af Amer: >60 mL/min (ref >60)
Glucose, Bld: 86 mg/dL (ref 70–99)
Potassium: 4.1 mmol/L (ref 3.5–5.1)
Sodium: 141 mmol/L (ref 135–145)

## 2019-09-01 MED ORDER — RANOLAZINE ER 500 MG PO TB12: 500.0000 mg | ORAL_TABLET | Freq: Two times a day (BID) | ORAL | 5 refills | Status: DC

## 2019-09-01 NOTE — Progress Notes (Signed)
Office Visit    Patient Name: Marcus Roberts Date of Encounter: 09/01/2019  Primary Care Provider:  McLean-Scocuzza, Nino Glow, MD Primary Cardiologist:  Nelva Bush, MD  Chief Complaint    Chief Complaint  Patient presents with  . OTHER    Chest pains. Medications verbally reviewed with patient.     44 year old male with history of CAD s/p PCI to the proximal/mid LAD 02/2019, GERD, obesity s/p gastric sleeve, nephrolithiasis, and anxiety/depression, and who presents for follow-up of chest pain.  Past Medical History    Past Medical History:  Diagnosis Date  . Anxiety   . Aortic atherosclerosis ([place])   . CAD (coronary artery disease)    a. 12/2018 ETT: Ex time 9:39. 39mm horiz ST dep in V4-V6 with abnormal coronary CTA. b. Cath 02/2019 with multivessel CAD s/p orbital atherectomy/DES to prox-mid LAD, EF normal.  . Chest pain   . Depression   . GERD (gastroesophageal reflux disease)   . History of echocardiogram    a. 12/2018 Echo: EF 60-65%, no rwma. Mildly dil LA.  Marland Kitchen Hyperlipidemia   . Kidney stones    Past Surgical History:  Procedure Laterality Date  . CHOLECYSTECTOMY    . CORONARY ATHERECTOMY N/A 02/13/2019   Procedure: CORONARY ATHERECTOMY;  Surgeon: Nelva Bush, MD;  Location: [facility];  Service: Cardiovascular;  Laterality: N/A;  . CORONARY STENT INTERVENTION N/A 02/13/2019   Procedure: CORONARY STENT INTERVENTION;  Surgeon: Nelva Bush, MD;  Location: [facility];  Service: Cardiovascular;  Laterality: N/A;  . INTRAVASCULAR PRESSURE WIRE/FFR STUDY N/A 02/13/2019   Procedure: INTRAVASCULAR PRESSURE WIRE/FFR STUDY;  Surgeon: Nelva Bush, MD;  Location: [facility];  Service: Cardiovascular;  Laterality: N/A;  . INTRAVASCULAR ULTRASOUND/IVUS N/A 02/13/2019   Procedure: Intravascular Ultrasound/IVUS;  Surgeon: Nelva Bush, MD;  Location: [facility];  Service: Cardiovascular;  Laterality: N/A;  . LEFT HEART CATH AND CORONARY  ANGIOGRAPHY N/A 02/13/2019   Procedure: LEFT HEART CATH AND CORONARY ANGIOGRAPHY;  Surgeon: Nelva Bush, MD;  Location: [facility];  Service: Cardiovascular;  Laterality: N/A;  . vertical sleeve gastrectomy     2013 85% stomach removed was 280 lbs before surgery     Allergies  Allergies  Allergen Reactions  . Imdur [Isosorbide Nitrate]     Did not tolerate due to headache    History of Present Illness    Marcus Roberts is a 44 y.o. male with PMH as above.   He was last seen in office 08/05/2019. He noted some mild exertional chest discomfort that was ongoing and began when he started exercising; however, this would resolve after pushing through his routine.  Overall, he felt much better than before his PCI.  He was concerned about fatigue and erectile dysfunction, prompting discontinuation of metoprolol at the previous visit.  Unfortunately, however, this discontinuation of BB did not result in significant improvement in his energy.  He noted being diagnosed with OSA while living in [place] approximately 10 years earlier and was not using his CPAP. He was going white water kayaking over the summer.  It was noted that his chest pain could be 2/2 his revascularized CAD consisting of chronic total occlusion of the distal RPL system and OM 2.  Also noted was is suboptimally controlled GERD.  His chest pain seem to be worse if exercising after eating.  He was encouraged to speak with his PCP about GI referral.  It was noted he may  benefit from endoscopy given his history of bariatric surgery.  Referral to sleep study was discussed.  Amlodipine was increased to 5 mg daily for additional antianginal therapy.  He wished to defer retrial of beta-blocker.  Nitrates were precluded by tadalafil use.  It was noted that if symptoms persisted, retrial of beta blockade versus Ranexa were considerations.  It was noted that he can continue with white water kayaking.  However, he was advised to be careful  on DAPT with ASA and clopidogrel.  After his clinic visit, he contacted the office with report of chest discomfort at rest that had since resolved without recurrence. Per review of MyChart message, he writes that he attended a [employer] rescue course that was physically demanding and triggered an episode of CP, rated 9/10. He pushed through the pain, and once in a place where he was able to rest and breathe slowly, his CP dissipated within minutes. Today and via MyChart, he also noted CP with intercourse that was 8/10 that required him to stop and was concerning to him. He noted he was more aware of his heart rather than experiencing any CP at rest. Given his report of CP at rest, recommendation was to present to the office the following day.    Today, 09/02/19, he presents to clinic with report of the above. In addition, on the day of his visit today (7/28), he has noted SOB/DOE with minimal exertion. This is new for him. He reports today that his CP experienced is still a burning that occurs in the center and radiates to the right of his chest. He occasionally also feels it is stinging / sharp. He notes that it occurs with exertion, lasting 5-10 minutes, and usually resolves almost immediately with rest. He denies CP at rest but has noted feeling aware of his heart. He wonders if anxiety might be playing a role. He states that this CP feels different than that of his GERD. He has been trying to eat smaller meals and takes his PPI daily. In addition, his CP with activity does not always occur shortly after eating. He notes that he is working with his PCP to try a different medication, given he feels his PPI is only mildly effective against his GERD. OSA sx were briefly reviewed with patient denying any snoring or waking during the night (and no reported sx by his wife).  He denies palpitations, pnd, orthopnea, n, v, dizziness, syncope, edema, weight gain, or early satiety. He only notes racing HR with exertion  and when appropriately responding to physical demands. He denies any s/sx of bleeding. Of note, he reports myalgias today and wonders if this could be related to his statin. On review of EMR together, his sx do correspond with that of the escalation of his statin therapy with recommendations / discussion as below.   Home Medications    Prior to Admission medications   Medication Sig Start Date End Date Taking? Authorizing Provider  amLODipine (NORVASC) 5 MG tablet Take 1 tablet (5 mg total) by mouth daily. 08/05/19 11/03/19  End, Harrell Gave, MD  aspirin EC 81 MG tablet Take 81 mg by mouth daily.     [provider]  atorvastatin (LIPITOR) 80 MG tablet Take 1 tablet (80 mg total) by mouth daily. 02/04/19 02/04/20  Theora Gianotti, NP  clopidogrel (PLAVIX) 75 MG tablet Take 1 tablet (75 mg total) by mouth daily. Stop Brilinta. 02/26/19   End, Harrell Gave, MD  cyclobenzaprine (FLEXERIL) 10 MG tablet Take  10 mg by mouth 2 (two) times daily as needed for muscle spasms.     [provider]  fluticasone (FLONASE) 50 MCG/ACT nasal spray Place 1 spray into both nostrils every evening.     [provider]  LORazepam (ATIVAN) 1 MG tablet Take 1 tablet (1 mg total) by mouth daily as needed for anxiety. 05/19/19   McLean-Scocuzza, Nino Glow, MD  Multiple Vitamin (MULTIVITAMIN WITH MINERALS) TABS tablet Take 1 tablet by mouth 3 (three) times a week.    [provider]  nitroGLYCERIN (NITROSTAT) 0.4 MG SL tablet Place 1 tablet (0.4 mg total) under the tongue every 5 (five) minutes as needed for chest pain. 02/02/19   End, Harrell Gave, MD  pantoprazole (PROTONIX) 40 MG tablet Take 1 tablet (40 mg total) by mouth daily. 30 min before breakfast or Dinner 03/16/19   McLean-Scocuzza, Nino Glow, MD  tadalafil (CIALIS) 20 MG tablet 1 tab 1 hour prior to intercourse.  Do not take nitroglycerin within 36 hours of taking tadalafil 06/29/19   Stoioff, Ronda Fairly, MD    Review of Systems      He notes ongoing CP with exertion. He feels aware of his heart at rest. As of 09/02/19, he has noted DOE with minimal activity. He reports myalgias with timeline consistent with that of the escalation of his statin therapy. He denies palpitations, pnd, orthopnea, n, v, dizziness, syncope, edema, weight gain, or early satiety. He only notes racing HR with exertion and when appropriately responding to physical demands. He denies any s/sx of bleeding. All other systems reviewed and are otherwise negative except as noted above.  Physical Exam    VS:  Ht 5\' 7"  (1.702 m)   BMI 33.42 kg/m  , BMI Body mass index is 33.42 kg/m. GEN: Well nourished, well developed, in no acute distress. HEENT: normal. Neck: Supple, no JVD, carotid bruits, or masses. Cardiac: RRR, no murmurs, rubs, or gallops. No clubbing, cyanosis, edema.  Radials/DP/PT 2+ and equal bilaterally.  Respiratory:  Respirations regular and unlabored, right middle lobe adventitious breath sounds. GI: Soft, nontender, nondistended, BS + x 4. MS: no deformity or atrophy. Skin: warm and dry, no rash. Neuro:  Strength and sensation are intact. Psych: Normal affect.  Accessory Clinical Findings    ECG personally reviewed by me today -NSR, 67 bpm, sinus arrhythmia, LVH with repolarization changes, no acute changes from previous- no acute changes.  VITALS Reviewed today   Temp Readings from Last 3 Encounters:  02/14/19 98.2 F (36.8 C) (Oral)  02/04/19 98.2 F (36.8 C)  12/11/18 98.2 F (36.8 C) (Oral)   BP Readings from Last 3 Encounters:  08/05/19 126/80  06/29/19 (!) 142/84  04/29/19 114/82   Pulse Readings from Last 3 Encounters:  08/05/19 69  06/29/19 67  04/29/19 (!) 56    Wt Readings from Last 3 Encounters:  08/05/19 213 lb 6 oz (96.8 kg)  06/29/19 210 lb (95.3 kg)  04/29/19 213 lb (96.6 kg)     LABS  reviewed today   Lab Results  Component Value Date   WBC 8.6 02/14/2019   HGB 14.7 02/14/2019   HCT 41.8  02/14/2019   MCV 88.7 02/14/2019   PLT 177 02/14/2019   Lab Results  Component Value Date   CREATININE 0.82 02/14/2019   BUN 9 02/14/2019   NA 141 02/14/2019   K 3.6 02/14/2019   CL 106 02/14/2019   CO2 25 02/14/2019   Lab Results  Component Value Date  ALT 25 04/23/2019   AST 22 04/23/2019   ALKPHOS 71 04/23/2019   BILITOT 1.2 04/23/2019   Lab Results  Component Value Date   CHOL 123 04/23/2019   HDL 37 (L) 04/23/2019   LDLCALC 67 04/23/2019   TRIG 96 04/23/2019   CHOLHDL 3.3 04/23/2019    No results found for: HGBA1C Lab Results  Component Value Date   TSH 1.67 06/13/2017     STUDIES/PROCEDURES reviewed today   LHC 02/13/2019 1. Significant three-vessel coronary artery disease, including sequential 60% proximal, 90% mid, and 40% distal LAD stenoses, chronic total occlusion of mid LCx with left to left collaterals, sequential 40 and 60% mid/distal RCA stenoses that are borderline hemodynamically significant by FFR, and chronic total occlusion of RCA continuation with left-to-right collaterals. 2. Normal left ventricular systolic function with mildly elevated filling pressure. 3. Successful IVUS guided orbital atherectomy and drug-eluting stent placement to the proximal and mid LAD using Synergy 3.0 x 38 mm drug-eluting stent with 0% residual stenosis and TIMI-3 flow. Recommendations: 1. Overnight extended recovery. 2. Dual antiplatelet therapy with aspirin and ticagrelor for at least 6 months, ideally longer. 3. Aggressive secondary prevention, including high intensity statin therapy.  Echo 12/2018 1. Left ventricular ejection fraction, by visual estimation, is 60 to  65%. The left ventricle has normal function. There is no left ventricular  hypertrophy.  2. Global right ventricle has normal systolic function.The right  ventricular size is normal. No increase in right ventricular wall  thickness.  3. Left atrial size was mildly dilated.  4. TR signal is  inadequate for assessing pulmonary artery systolic  pressure.   ETT 12/18/2018  Baseline EKG demonstrates normal sinus rhythm without significant abnormalities.  The patient demonstrated good exercise capacity with normal heart rate and blood pressure responses. The patient did not experience chest pain during the test.  There were 2 mm horizontal ST segment depressions in V4-6. No T wave inversion was seen.  There were no significant arrhythmias during stress or recovery.  Intermediate risk exercise tolerance test (Duke Treadmill Score = -1). Intermediate risk exercise tolerance test with 2 mm ST depression in V4-6 during peak stress without associated angina.   Assessment & Plan    Chest pain, ongoing / progressive --Occurs with exertion, alleviated with rest. S/p cath as detailed above with significant 3v CAD s/p atherectomy and DES to p/mLAD and noted CTO of rPL and OM2 with collaterals. Nl LVSF, mildly elevated filling pressures. Reviewed previously discussed ddx of GERD with possible further workup with endoscopy versus OSA and possible sleep study. Will defer for now after discussion with pt. Continue DAPT with care to avoid injury while white water rafting. Denies significant relief with increased amlodipine 5mg  daily. After discussion, will defer re-trial of BB, especially given new DOE. Will instead plan to start Ranexa 500mg  BID today for additional antianginal relief. Long acting nitrates precluded given tadalafil use. Also reported HA with Imdur. Continue high intensity statin for now with recommendations regarding myalgias as outlined below. Case discussed with DOD this afternoon with agreement to continue medical therapy and monitoring of sx. Defer further ischemic workup pending CXR. Contact the office if progressive or new concerning sx.    New DOE with Calcified RML granuloma on prior CT  --New DOE with minimal exertion. Not volume overloaded on exam. Wt stable from previous  visit. BP well controlled. Sx began today. Adventitious breath sounds heard in right middle lobe today. CXR ordered to r/o infection or malignancy. Of  note, on further review of EMR, 01/2019 CT with incidental right middle lobe granuloma, noted after exam today. Further recommendations following imaging, if needed. Will continue to defer BB given DOE. Consider further ischemic evaluation with ongoing or progressive sx as above and if CXR / lung images without significant findings.  Myalgias Hyperlipidemia, goal LDL <70 --Suspected as 2/2 escalation of statin therapy. Reports as somewhat interfering with lifestyle. Discussed need for high intensity statin therapy given 3v CAD per GDMT. Discussed PCSK9i versus decreased statin or Crestor with addition of Zetia. Patient indicates today that he is agreeable with either and will reach out to pharmacy team regarding eligibility for PCSK9i given his dz and family history. Further recommendations at that time. Continue current statin for now.  GERD --Reports recent CP not correlating to GERD sx. Does note that his GERD is sub-optimally controlled on his PPI and working with PCP to hopefully trial a new medication. Preference is to defer GI referral / endoscopy at this time, previously recommended and given h/o bariatric surgery.  Fatigue --Denies improvement with discontinuation of metoprolol. Previous sleep apnea, diagnosed 10 years ago. Discussed central versus obstructive sleep apnea and long term s/sx of untreated sleep apnea. Consider that his sleep apnea could be a combination of obstructive and central and therefore ongoing, despite weight loss. Will defer sleep study for now, given he denies any clear evidence of snoring or disrupted sleep. Reassess pulmonology referral for sleep study at RTC. As above, will continue to defer retrial of BB for now.   Medication changes: Ranexa 500mg  BID. Labs ordered: CMET. Studies / Imaging ordered: CXR (abnormal RML  breath sounds on exam). Future considerations: Sleep study / pulmonology referral, endoscopy / GI referral. Re-trial of BB. Repeat EKG on Ranexa. Start of PCSK9i versus reduced statin with Zetia. Further ischemic workup if indicated.  Disposition: RTC 2 weeks.   Arvil Chaco, PA-C 09/01/2019

## 2019-09-01 NOTE — Patient Instructions (Addendum)
Medication Instructions:  Your physician has recommended you make the following change in your medication:  1- START Ranexa 500 mg (1 tablet) by mouth two times a day.  *If you need a refill on your cardiac medications before your next appointment, please call your pharmacy*  Lab Work: Your physician recommends that you return for lab work in: [place].   If you have labs (blood work) drawn today and your tests are completely normal, you will receive your results only by: Marland Kitchen MyChart Message (if you have MyChart) OR . A paper copy in the mail If you have any lab test that is abnormal or we need to change your treatment, we will call you to review the results.  Testing/Procedures: A chest x-ray takes a picture of the organs and structures inside the chest, including the heart, lungs, and blood vessels. This test can show several things, including, whether the heart is enlarges; whether fluid is building up in the lungs; and whether pacemaker / defibrillator leads are still in place. At the [facility], you and your health needs are our priority.  As part of our continuing mission to provide you with exceptional heart care, we have created designated Provider Care Teams.  These Care Teams include your primary Cardiologist (physician) and Advanced Practice Providers (APPs -  Physician Assistants and Nurse Practitioners) who all work together to provide you with the care you need, when you need it.  We recommend signing up for the patient portal called "MyChart".  Sign up information is provided on this After Visit Summary.  MyChart is used to connect with patients for Virtual Visits (Telemedicine).  Patients are able to view lab/test results, encounter notes, upcoming appointments, etc.  Non-urgent messages can be sent to your provider as well.   To learn more about what you can do with MyChart, go to [email or web address].    Your next appointment:   2  week(s) with Dr End  The format for your next appointment:   In Person  Provider:    You may see Nelva Bush, MD or one of the following Advanced Practice Providers on your designated Care Team:    Marrianne Mood, [place]

## 2019-09-03 ENCOUNTER — Telehealth: Payer: Self-pay

## 2019-09-03 NOTE — Telephone Encounter (Signed)
Patient returning call.

## 2019-09-03 NOTE — Telephone Encounter (Signed)
Visser, Jacquelyn D, PA-C  P [facility] Triage Chest xray without concerning findings. CT scans often show a more detailed picture; therefore, reviewed 01/2019 CT as well. This showed a very small granuloma (area of infection / inflammation) in the area noted on exam 7/27 to have abnormal breath sounds. No concerning / suspicious nodules or masses, which is very reassuring. Let's continue to monitor his symptoms and reassess at upcoming visit.     Attempted to call patient. LMTCB 09/03/2019

## 2019-09-03 NOTE — Telephone Encounter (Signed)
-----   Message from Arvil Chaco, PA-C sent at 09/02/2019  9:44 PM EDT ----- Renal function stable and within normal range. Electrolytes / potassium at goal. (4.0-5.0). Reassuring labs. No medication changes / additional recommendations beyond those discussed at time of clinic.

## 2019-09-03 NOTE — Telephone Encounter (Signed)
Reviewed results and recommendations with patient and he had no further questions at this time.

## 2019-09-15 ENCOUNTER — Other Ambulatory Visit: Payer: Self-pay

## 2019-09-15 ENCOUNTER — Ambulatory Visit: Payer: 59 | Admitting: Physician Assistant

## 2019-09-15 ENCOUNTER — Encounter: Payer: Self-pay | Admitting: Physician Assistant

## 2019-09-15 VITALS — BP 110/70 | HR 77 | Ht 66.0 in | Wt 215.0 lb

## 2019-09-15 DIAGNOSIS — R600 Localized edema: Secondary | ICD-10-CM

## 2019-09-15 DIAGNOSIS — R0789 Other chest pain: Secondary | ICD-10-CM | POA: Diagnosis not present

## 2019-09-15 DIAGNOSIS — M791 Myalgia, unspecified site: Secondary | ICD-10-CM

## 2019-09-15 DIAGNOSIS — I25118 Atherosclerotic heart disease of native coronary artery with other forms of angina pectoris: Secondary | ICD-10-CM | POA: Diagnosis not present

## 2019-09-15 DIAGNOSIS — J8410 Pulmonary fibrosis, unspecified: Secondary | ICD-10-CM

## 2019-09-15 DIAGNOSIS — E785 Hyperlipidemia, unspecified: Secondary | ICD-10-CM

## 2019-09-15 DIAGNOSIS — R0609 Other forms of dyspnea: Secondary | ICD-10-CM

## 2019-09-15 DIAGNOSIS — R5383 Other fatigue: Secondary | ICD-10-CM

## 2019-09-15 DIAGNOSIS — J984 Other disorders of lung: Secondary | ICD-10-CM

## 2019-09-15 DIAGNOSIS — K219 Gastro-esophageal reflux disease without esophagitis: Secondary | ICD-10-CM

## 2019-09-15 DIAGNOSIS — R0600 Dyspnea, unspecified: Secondary | ICD-10-CM | POA: Diagnosis not present

## 2019-09-15 MED ORDER — FUROSEMIDE 20 MG PO TABS
20.0000 mg | ORAL_TABLET | Freq: Every day | ORAL | 3 refills | Status: DC | PRN
Start: 2019-09-15 — End: 2021-02-01

## 2019-09-15 MED ORDER — FUROSEMIDE 20 MG PO TABS
20.0000 mg | ORAL_TABLET | Freq: Every day | ORAL | 3 refills | Status: DC
Start: 2019-09-15 — End: 2019-09-15

## 2019-09-15 NOTE — Patient Instructions (Signed)
Medication Instructions:  1- Take a statin holiday for 7 days and report back to Elenor Quinones, PA 2- START Lasix Take 1 tablet (20 mg total) by mouth daily as needed for edema *If you need a refill on your cardiac medications before your next appointment, please call your pharmacy*   Lab Work: Your physician recommends that you have lab work today(BMET)  If you have labs (blood work) drawn today and your tests are completely normal, you will receive your results only by: Marland Kitchen MyChart Message (if you have MyChart) OR . A paper copy in the mail If you have any lab test that is abnormal or we need to change your treatment, we will call you to review the results.   Testing/Procedures: None ordered   Follow-Up: At [facility], you and your health needs are our priority.  As part of our continuing mission to provide you with exceptional heart care, we have created designated Provider Care Teams.  These Care Teams include your primary Cardiologist (physician) and Advanced Practice Providers (APPs -  Physician Assistants and Nurse Practitioners) who all work together to provide you with the care you need, when you need it.  We recommend signing up for the patient portal called "MyChart".  Sign up information is provided on this After Visit Summary.  MyChart is used to connect with patients for Virtual Visits (Telemedicine).  Patients are able to view lab/test results, encounter notes, upcoming appointments, etc.  Non-urgent messages can be sent to your provider as well.   To learn more about what you can do with MyChart, go to [email or web address].    Your next appointment:   2-3 month(s)  The format for your next appointment:   In Person  Provider:     You may see Nelva Bush, MD or Marrianne Mood, PA-C    Other Instructions Ref to Pulmonology for sleep study

## 2019-09-15 NOTE — Progress Notes (Signed)
Office Visit    Patient Name: Marcus Roberts Date of Encounter: 09/15/2019  Primary Care Provider:  McLean-Scocuzza, Nino Glow, MD Primary Cardiologist:  Nelva Bush, MD  Chief Complaint    Chief Complaint  Patient presents with  . OTHER    2 wk f/u no complaints today. Meds reviewed verbally with pt.    44 year old male with history of CAD s/p PCI to the proximal/mid LAD 02/2019, GERD, obesity s/p gastric sleeve, nephrolithiasis, anxiety/depression, and who presents for 2 week follow-up of chest pain.  Past Medical History    Past Medical History:  Diagnosis Date  . Anxiety   . Aortic atherosclerosis ([place])   . CAD (coronary artery disease)    a. 12/2018 ETT: Ex time 9:39. 66mm horiz ST dep in V4-V6 with abnormal coronary CTA. b. Cath 02/2019 with multivessel CAD s/p orbital atherectomy/DES to prox-mid LAD, EF normal.  . Chest pain   . Depression   . GERD (gastroesophageal reflux disease)   . History of echocardiogram    a. 12/2018 Echo: EF 60-65%, no rwma. Mildly dil LA.  Marland Kitchen Hyperlipidemia   . Kidney stones    Past Surgical History:  Procedure Laterality Date  . CHOLECYSTECTOMY    . CORONARY ATHERECTOMY N/A 02/13/2019   Procedure: CORONARY ATHERECTOMY;  Surgeon: Nelva Bush, MD;  Location: [facility];  Service: Cardiovascular;  Laterality: N/A;  . CORONARY STENT INTERVENTION N/A 02/13/2019   Procedure: CORONARY STENT INTERVENTION;  Surgeon: Nelva Bush, MD;  Location: [facility];  Service: Cardiovascular;  Laterality: N/A;  . INTRAVASCULAR PRESSURE WIRE/FFR STUDY N/A 02/13/2019   Procedure: INTRAVASCULAR PRESSURE WIRE/FFR STUDY;  Surgeon: Nelva Bush, MD;  Location: [facility];  Service: Cardiovascular;  Laterality: N/A;  . INTRAVASCULAR ULTRASOUND/IVUS N/A 02/13/2019   Procedure: Intravascular Ultrasound/IVUS;  Surgeon: Nelva Bush, MD;  Location: [facility];  Service: Cardiovascular;  Laterality: N/A;  . LEFT HEART CATH AND  CORONARY ANGIOGRAPHY N/A 02/13/2019   Procedure: LEFT HEART CATH AND CORONARY ANGIOGRAPHY;  Surgeon: Nelva Bush, MD;  Location: [facility];  Service: Cardiovascular;  Laterality: N/A;  . vertical sleeve gastrectomy     2013 85% stomach removed was 280 lbs before surgery     Allergies  Allergies  Allergen Reactions  . Imdur [Isosorbide Nitrate]     Did not tolerate due to headache    History of Present Illness    BARNARD Roberts is a 44 y.o. male with PMH as above.   He was last seen in office 08/05/2019. He noted some mild exertional chest discomfort that was ongoing and began when he started exercising; however, this would resolve after pushing through his routine.  Overall, he felt much better than before his PCI.  He was concerned about fatigue and erectile dysfunction, prompting discontinuation of metoprolol at the previous visit.  Unfortunately, however, this discontinuation of BB did not result in significant improvement in his energy.  He noted being diagnosed with OSA while living in [place] approximately 10 years earlier and was not using his CPAP. He was going white water kayaking over the summer.  It was noted that his chest pain could be 2/2 his revascularized CAD consisting of chronic total occlusion of the distal RPL system and OM 2.  Also noted was is suboptimally controlled GERD.  His chest pain seem to be worse if exercising after eating.  He was encouraged to speak with his PCP about GI referral.  It  was noted he may benefit from endoscopy given his history of bariatric surgery.  Referral to sleep study was discussed.  Amlodipine was increased to 5 mg daily for additional antianginal therapy.  He wished to defer retrial of beta-blocker.  Nitrates were precluded by tadalafil use.  It was noted that if symptoms persisted, retrial of beta blockade versus Ranexa were considerations.  It was noted that he can continue with white water kayaking.  However, he was advised to be  careful on DAPT with ASA and clopidogrel.  After his clinic visit, he contacted the office with report of chest discomfort related to attending a [employer] rescue course that was physically demanding. CP rated 9/10. He pushed through the pain, and once in a place where he was able to rest and breathe slowly, his CP dissipated within minutes. He noted CP with intercourse that was 8/10 and required him to stop. He was more aware of his heart.    He was seen 09/02/19 for this CP and also reported SOB/DOE with minimal exertion. CP described as a burning that occurs in the center of his chest and radiates to the right with occasional stinging / sharp pain. It occurred with exertion, lasting 5-10 minutes, and resolving almost immediately with rest. CP was described as different from GERD. He was eating smaller meals and taking his PPI daily for his GERD. On screening for sleep apnea, he denied snoring or waking during the night as well as sx reported by his wife.  He noted  racing HR with exertion and when appropriately responding to physical demands. Myalgias/arthralgias noted with pt wondering if this could be related to his statin given recent escalation in dose. He was started on Ranexa 500mg  BID for additional antianginal effect. 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 Options for statin reviewed. GI and sleep study deferred.   Today, he returns to clinic and notes ongoing CP / tightness with activity. He is not certain if his CP has improved with Ranexa 500mg  BID, though feels that he may have noted a slight improvement with his most recent workout. He reports that the CP will start with exertion and as described above then dissipate in roughly 72m and if he pushes through the pain and continues his workout. Associated sx include DOE and poor exercise tolerance / reduced exercise tolerance. He reports he is  currently walking and lifting weights for exercise 3-4 times per week. On further discussion, he notes that he did not complete all of cardiac rehab due to insurance limitations though did attend one or two classes and increased activity as tolerated based on those classes on his own. He reports occasional racing HR as above. Deconditioning versus anxiety regarding his cardiac health were again reviewed. No pnd, orthopnea, n, v, dizziness, syncope, or early satiety. As of today, he has noted bilateral LEE as well as noted on exam with clinic wt increased from previous as below. He reports ongoing myalgias but still is taking his statin. He reports medication compliance. He has been taking OTC supplements and reports recently starting Co-Q10 and l-arginine. He reports that he has noted improvement in his sx most with l-arginine over that of Ranexa.   Home Medications    Prior to Admission medications   Medication Sig Start Date End Date Taking? Authorizing Provider  amLODipine (NORVASC) 5 MG tablet Take 1 tablet (5 mg total) by mouth daily. 08/05/19  11/03/19  End, Harrell Gave, MD  aspirin EC 81 MG tablet Take 81 mg by mouth daily.     [provider]  atorvastatin (LIPITOR) 80 MG tablet Take 1 tablet (80 mg total) by mouth daily. 02/04/19 02/04/20  Theora Gianotti, NP  clopidogrel (PLAVIX) 75 MG tablet Take 1 tablet (75 mg total) by mouth daily. Stop Brilinta. 02/26/19   End, Harrell Gave, MD  cyclobenzaprine (FLEXERIL) 10 MG tablet Take 10 mg by mouth 2 (two) times daily as needed for muscle spasms.     [provider]  fluticasone (FLONASE) 50 MCG/ACT nasal spray Place 1 spray into both nostrils every evening.     [provider]  LORazepam (ATIVAN) 1 MG tablet Take 1 tablet (1 mg total) by mouth daily as needed for anxiety. 05/19/19   McLean-Scocuzza, Nino Glow, MD  Multiple Vitamin (MULTIVITAMIN WITH MINERALS) TABS tablet Take 1 tablet by mouth 3 (three) times a week.     [provider]  nitroGLYCERIN (NITROSTAT) 0.4 MG SL tablet Place 1 tablet (0.4 mg total) under the tongue every 5 (five) minutes as needed for chest pain. 02/02/19   End, Harrell Gave, MD  pantoprazole (PROTONIX) 40 MG tablet Take 1 tablet (40 mg total) by mouth daily. 30 min before breakfast or Dinner 03/16/19   McLean-Scocuzza, Nino Glow, MD  tadalafil (CIALIS) 20 MG tablet 1 tab 1 hour prior to intercourse.  Do not take nitroglycerin within 36 hours of taking tadalafil 06/29/19   Stoioff, Ronda Fairly, MD    Review of Systems    He notes ongoing CP with exertion and associated DOE. He reports ongoing myalgias. New report of LEE today with clinic wt increased from previous and edema on exam. Ongoing racing HR reported. No pnd, orthopnea, n, v, dizziness, syncope, or early satiety. No s/sx of bleeding. All other systems reviewed and are otherwise negative except as noted above.  Physical Exam    VS:  BP 110/70 (BP Location: Left Arm, Patient Position: Sitting, Cuff Size: Normal)   Pulse 77   Ht 5\' 6"  (1.676 m)   Wt 215 lb (97.5 kg)   SpO2 98%   BMI 34.70 kg/m  , BMI Body mass index is 34.7 kg/m. GEN: Well nourished, well developed, in no acute distress. HEENT: normal. Neck: Supple, no JVD, carotid bruits, or masses. Cardiac: RRR, no murmurs, rubs, or gallops. No clubbing, cyanosis. 1+ bilateral edema.  Radials/DP/PT 2+ and equal bilaterally.  Respiratory:  Respirations regular and unlabored, CTAB. GI: Soft, nontender, nondistended, BS + x 4. MS: no deformity or atrophy. Skin: warm and dry, no rash. Neuro:  Strength and sensation are intact. Psych: Normal affect.  Accessory Clinical Findings    ECG personally reviewed by me today -EKG declined today by patient  VITALS Reviewed today   Temp Readings from Last 3 Encounters:  02/14/19 98.2 F (36.8 C) (Oral)  02/04/19 98.2 F (36.8 C)  12/11/18 98.2 F (36.8 C) (Oral)   BP Readings from Last 3 Encounters:  09/15/19 110/70    09/01/19 116/78  08/05/19 126/80   Pulse Readings from Last 3 Encounters:  09/15/19 77  09/01/19 67  08/05/19 69    Wt Readings from Last 3 Encounters:  09/15/19 215 lb (97.5 kg)  09/01/19 (!) 213 lb 9.6 oz (96.9 kg)  08/05/19 213 lb 6 oz (96.8 kg)     LABS  reviewed today   Lab Results  Component Value Date   WBC 8.6 02/14/2019   HGB 14.7  02/14/2019   HCT 41.8 02/14/2019   MCV 88.7 02/14/2019   PLT 177 02/14/2019   Lab Results  Component Value Date   CREATININE 0.94 09/01/2019   BUN 20 09/01/2019   NA 141 09/01/2019   K 4.1 09/01/2019   CL 104 09/01/2019   CO2 28 09/01/2019   Lab Results  Component Value Date   ALT 25 04/23/2019   AST 22 04/23/2019   ALKPHOS 71 04/23/2019   BILITOT 1.2 04/23/2019   Lab Results  Component Value Date   CHOL 123 04/23/2019   HDL 37 (L) 04/23/2019   LDLCALC 67 04/23/2019   TRIG 96 04/23/2019   CHOLHDL 3.3 04/23/2019    No results found for: HGBA1C Lab Results  Component Value Date   TSH 1.67 06/13/2017     STUDIES/PROCEDURES reviewed today   LHC 02/13/2019 1. Significant three-vessel coronary artery disease, including sequential 60% proximal, 90% mid, and 40% distal LAD stenoses, chronic total occlusion of mid LCx with left to left collaterals, sequential 40 and 60% mid/distal RCA stenoses that are borderline hemodynamically significant by FFR, and chronic total occlusion of RCA continuation with left-to-right collaterals. 2. Normal left ventricular systolic function with mildly elevated filling pressure. 3. Successful IVUS guided orbital atherectomy and drug-eluting stent placement to the proximal and mid LAD using Synergy 3.0 x 38 mm drug-eluting stent with 0% residual stenosis and TIMI-3 flow. Recommendations: 1. Overnight extended recovery. 2. Dual antiplatelet therapy with aspirin and ticagrelor for at least 6 months, ideally longer. 3. Aggressive secondary prevention, including high intensity statin  therapy.  Echo 12/2018 1. Left ventricular ejection fraction, by visual estimation, is 60 to  65%. The left ventricle has normal function. There is no left ventricular  hypertrophy.  2. Global right ventricle has normal systolic function.The right  ventricular size is normal. No increase in right ventricular wall  thickness.  3. Left atrial size was mildly dilated.  4. TR signal is inadequate for assessing pulmonary artery systolic  pressure.   ETT 12/18/2018  Baseline EKG demonstrates normal sinus rhythm without significant abnormalities.  The patient demonstrated good exercise capacity with normal heart rate and blood pressure responses. The patient did not experience chest pain during the test.  There were 2 mm horizontal ST segment depressions in V4-6. No T wave inversion was seen.  There were no significant arrhythmias during stress or recovery.  Intermediate risk exercise tolerance test (D[facility] Treadmill Score = -1). Intermediate risk exercise tolerance test with 2 mm ST depression in V4-6 during peak stress without associated angina.   Assessment & Plan    Chest pain, ongoing / progressive --Ongoing CP occurs with exertion and continues for 15 minutes if ongoing activity then dissipates for remainder of exercise. Associated DOE, reduced exercise tolerance. CP different from GERD. S/p cath with 3v CAD wit atherectomy & DES to p/mLAD. CTO of rPL & OM2 with collaterals. Nl LVSF, mildly elevated filling pressures.   --Given elevated filling pressures on cath, mildly elevated volume status today, and ongoing sx of CP/DOE despite amlodipine and Ranexa, start on low dose lasix 20mg  PRN for LEE. Check BMET.  --No anginal relief noted with addition of amlodipine and Ranexa 500mg  BID. Repeat EKG recommended but declined - EKG continues to be recommended to reassess QTc after start of Ranexa last visit. Defer uptitration of Ranexa given no repeat EKG and no reported CP relief with  initiation. Defer up-titration of amlodipine given soft BP and to avoid aggravating LEE. OTC l-arginine taken  with reported relief. Long acting nitrates precluded given tadalafil use and HA with Imdur. Re-trial of BB deferred given DOE, fatigue, and soft BP.  --Continue DAPT with amlodipine and Ranexa. Statin holiday as below. Ongoing aggressive risk factor modification. Sleep study referral provided today. GI referral deferred. Consider repeat echo +/- further ischemic workup if ongoing or progressive sx at [place], LEE --DOE with exertion. Mildly volume up on exam today with increased clinic wt. BP still well controlled.  Defer BB as above. Start lasix 20mg  PRN for LEE as mildly volume up. Obtain BMET. Instructed pt to call the office if taking lasix more than 3 days in a row without sx improvement, so that we can recheck BMET +/- up-titrate lasix as needed. Consider repeat echo +/- further ischemic workup if indicated at RTC.  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 CXR without significant findings. Previous adventitious breath sounds now resolved. Further workup / monitoring if indicated per PCP.   Myalgias Hyperlipidemia, goal LDL <70 --Suspected as 2/2 escalation of statin therapy - reports as interfering with lifestyle. Reviewed options for PCSK9i vs decreased dose atorvastatin + Zetia vs Crestor + Zetia. Will first trial a statin holiday and have pt reach out via MyChart in 1 week to let us know if improved myalgias. Further recommendations at that time.   GERD --Reports recent CP not similar to GERD sx. Pt preference is to defer GI referral / endoscopy at this time, previously recommended and given h/o bariatric surgery.   Fatigue --Denies improvement with discontinuation of metoprolol. Defer retrial of BB. Previous sleep apnea, diagnosed 10 years ago. Sleep study referral provided today. Continue to increase activity as tolerated.  Continue lifestyle modifications.  Medication changes: Lasix 20mg  PRN today for LEE with guidelines to call if used more than 3 days in a row as above. Cholesterol medication changes pending statin holiday results.  Labs ordered: BMET. Studies / Imaging ordered: None. Referrals: Sleep study / pulmonology. Future considerations: Endoscopy / GI referral. Re-trial BB. Repeat EKG on Ranexa. Start of PCSK9i versus reduced statin / Crestor with Zetia --> pending statin holiday results. Further echo +/- MPI or repeat cath if indicated.  Disposition: RTC 2-3 months.   Arvil Chaco, PA-C 09/15/2019

## 2019-09-16 LAB — BASIC METABOLIC PANEL
BUN/Creatinine Ratio: 15 (ref 9–20)
BUN: 14 mg/dL (ref 6–24)
CO2: 24 mmol/L (ref 20–29)
Calcium: 9.3 mg/dL (ref 8.7–10.2)
Chloride: 101 mmol/L (ref 96–106)
Creatinine, Ser: 0.96 mg/dL (ref 0.76–1.27)
GFR calc Af Amer: 113 mL/min/{1.73_m2} (ref >59)
GFR calc non Af Amer: 98 mL/min/{1.73_m2} (ref >59)
Glucose: 114 mg/dL — ABNORMAL HIGH (ref 65–99)
Potassium: 4 mmol/L (ref 3.5–5.2)
Sodium: 141 mmol/L (ref 134–144)

## 2019-09-28 DIAGNOSIS — I25118 Atherosclerotic heart disease of native coronary artery with other forms of angina pectoris: Secondary | ICD-10-CM

## 2019-09-28 NOTE — Telephone Encounter (Signed)
-----   Message from Arvil Chaco, PA-C sent at 09/28/2019 12:45 PM EDT ----- Regarding: Labs Please call this patient and set him up for a BMET in 1 week.   I just spoke with him, and he is increasing his Lasix from PRN to daily for 1 week (Lasix 20 mg daily x7 days) with repeat BMET at the end of that week.  Thanks!

## 2019-10-07 ENCOUNTER — Other Ambulatory Visit
Admission: RE | Admit: 2019-10-07 | Discharge: 2019-10-07 | Disposition: A | Discharge status: Home / Self Care | Payer: 59 | Source: Ambulatory Visit | Attending: Physician Assistant | Admitting: Physician Assistant

## 2019-10-07 DIAGNOSIS — I25118 Atherosclerotic heart disease of native coronary artery with other forms of angina pectoris: Secondary | ICD-10-CM | POA: Diagnosis not present

## 2019-10-07 LAB — BASIC METABOLIC PANEL
Anion gap: 11 (ref 5–15)
BUN: 18 mg/dL (ref 6–20)
CO2: 27 mmol/L (ref 22–32)
Calcium: 9.2 mg/dL (ref 8.9–10.3)
Chloride: 101 mmol/L (ref 98–111)
Creatinine, Ser: 0.94 mg/dL (ref 0.61–1.24)
GFR calc Af Amer: >60 mL/min (ref >60)
GFR calc non Af Amer: >60 mL/min (ref >60)
Glucose, Bld: 110 mg/dL — ABNORMAL HIGH (ref 70–99)
Potassium: 3.8 mmol/L (ref 3.5–5.1)
Sodium: 139 mmol/L (ref 135–145)

## 2019-11-04 ENCOUNTER — Ambulatory Visit: Payer: 59 | Admitting: Internal Medicine

## 2019-12-09 ENCOUNTER — Other Ambulatory Visit: Payer: Self-pay

## 2019-12-09 ENCOUNTER — Encounter: Payer: Self-pay | Admitting: Internal Medicine

## 2019-12-09 ENCOUNTER — Ambulatory Visit (INDEPENDENT_AMBULATORY_CARE_PROVIDER_SITE_OTHER): Payer: 59 | Admitting: Internal Medicine

## 2019-12-09 VITALS — BP 120/72 | HR 74 | Ht 66.0 in | Wt 210.0 lb

## 2019-12-09 DIAGNOSIS — I25118 Atherosclerotic heart disease of native coronary artery with other forms of angina pectoris: Secondary | ICD-10-CM

## 2019-12-09 DIAGNOSIS — E785 Hyperlipidemia, unspecified: Secondary | ICD-10-CM

## 2019-12-09 DIAGNOSIS — R5383 Other fatigue: Secondary | ICD-10-CM

## 2019-12-09 MED ORDER — ROSUVASTATIN CALCIUM 40 MG PO TABS
40.0000 mg | ORAL_TABLET | Freq: Every day | ORAL | 2 refills | Status: DC
Start: 2019-12-09 — End: 2020-06-01

## 2019-12-09 NOTE — Progress Notes (Signed)
Follow-up Outpatient Visit Date: 12/09/2019  Primary Care Provider: McLean-Scocuzza, Nino Glow, MD [place] [address]  Chief Complaint: Follow-up coronary artery disease  HPI:  Marcus Roberts is a 44 y.o. male with history of coronary artery disease status post PCI to the proximal/mid LAD (02/2019) with residual disease in the distal LCx and RCA, GERD, obesity status post gastric sleeve, nephrolithiasis, and anxiety/depression, who presents for follow-up of coronary artery disease.  He was last seen in our office in August by Marrianne Mood, PA, at which time he continued to complain of chest pain with activity.  He was maintained on amlodipine and ranolazine.  He was also referred for a sleep study, given the longstanding fatigue, though he has not proceeded with this as he overall feels like he is sleeping well and is generally well rested in the morning.  Today, Marcus Roberts reports that he is feeling better.  He reports less chest discomfort with activity.  He is now able to exercise regularly without any limitations.  A few nights ago, he felt a transient palpitation that shot across his chest with a vague warm feeling that persisted for several hours.  There were no associated symptoms.  He has not had recurrence of this.  He denies shortness of breath, lightheadedness, and edema.  He has noted some continued aches in his legs, which he feels were less pronounced during a 2-week holiday from atorvastatin.  He brings up the possibility of trying an alternative statin.  --------------------------------------------------------------------------------------------------  Past Medical History:  Diagnosis Date  . Anxiety   . Aortic atherosclerosis ([place])   . CAD (coronary artery disease)    a. 12/2018 ETT: Ex time 9:39. 65mm horiz ST dep in V4-V6 with abnormal coronary CTA. b. Cath 02/2019 with multivessel CAD s/p orbital atherectomy/DES to prox-mid LAD, EF normal.  . Chest pain   .  Depression   . GERD (gastroesophageal reflux disease)   . History of echocardiogram    a. 12/2018 Echo: EF 60-65%, no rwma. Mildly dil LA.  Marland Kitchen Hyperlipidemia   . Kidney stones    Past Surgical History:  Procedure Laterality Date  . CHOLECYSTECTOMY    . CORONARY ATHERECTOMY N/A 02/13/2019   Procedure: CORONARY ATHERECTOMY;  Surgeon: Nelva Bush, MD;  Location: [facility];  Service: Cardiovascular;  Laterality: N/A;  . CORONARY STENT INTERVENTION N/A 02/13/2019   Procedure: CORONARY STENT INTERVENTION;  Surgeon: Nelva Bush, MD;  Location: [facility];  Service: Cardiovascular;  Laterality: N/A;  . INTRAVASCULAR PRESSURE WIRE/FFR STUDY N/A 02/13/2019   Procedure: INTRAVASCULAR PRESSURE WIRE/FFR STUDY;  Surgeon: Nelva Bush, MD;  Location: [facility];  Service: Cardiovascular;  Laterality: N/A;  . INTRAVASCULAR ULTRASOUND/IVUS N/A 02/13/2019   Procedure: Intravascular Ultrasound/IVUS;  Surgeon: Nelva Bush, MD;  Location: [facility];  Service: Cardiovascular;  Laterality: N/A;  . LEFT HEART CATH AND CORONARY ANGIOGRAPHY N/A 02/13/2019   Procedure: LEFT HEART CATH AND CORONARY ANGIOGRAPHY;  Surgeon: Nelva Bush, MD;  Location: [facility];  Service: Cardiovascular;  Laterality: N/A;  . vertical sleeve gastrectomy     2013 85% stomach removed was 280 lbs before surgery     Current Meds  Medication Sig  . amLODipine (NORVASC) 5 MG tablet Take 1 tablet (5 mg total) by mouth daily.  Marland Kitchen aspirin EC 81 MG tablet Take 81 mg by mouth daily.   . clopidogrel (PLAVIX) 75 MG tablet Take 1 tablet (75 mg total) by mouth  daily. Stop Brilinta.  . cyclobenzaprine (FLEXERIL) 10 MG tablet Take 10 mg by mouth 2 (two) times daily as needed for muscle spasms.   . fluticasone (FLONASE) 50 MCG/ACT nasal spray Place 1 spray into both nostrils every evening.   . furosemide (LASIX) 20 MG tablet Take 1 tablet (20 mg total) by mouth daily as needed for edema.  Marland Kitchen  LORazepam (ATIVAN) 1 MG tablet Take 1 tablet (1 mg total) by mouth daily as needed for anxiety.  . Multiple Vitamin (MULTIVITAMIN WITH MINERALS) TABS tablet Take 1 tablet by mouth 3 (three) times a week.  . nitroGLYCERIN (NITROSTAT) 0.4 MG SL tablet Place 1 tablet (0.4 mg total) under the tongue every 5 (five) minutes as needed for chest pain.  . pantoprazole (PROTONIX) 40 MG tablet Take 1 tablet (40 mg total) by mouth daily. 30 min before breakfast or Dinner  . ranolazine (RANEXA) 500 MG 12 hr tablet Take 1 tablet (500 mg total) by mouth 2 (two) times daily.  . tadalafil (CIALIS) 20 MG tablet 1 tab 1 hour prior to intercourse.  Do not take nitroglycerin within 36 hours of taking tadalafil  . [DISCONTINUED] atorvastatin (LIPITOR) 80 MG tablet Take 1 tablet (80 mg total) by mouth daily.    Allergies: Imdur [isosorbide nitrate]  Social History   Tobacco Use  . Smoking status: Never Smoker  . Smokeless tobacco: Never Used  Vaping Use  . Vaping Use: Never used  Substance Use Topics  . Alcohol use: Yes    Comment: occas. 2-3 x per year   . Drug use: Never    Family History  Problem Relation Age of Onset  . Diabetes Father   . Heart disease Father        x2  . Hyperlipidemia Father   . Hypertension Father   . Heart attack Father 56       x2  . Heart disease Brother        MI in 44s  . Heart attack Brother 40    Review of Systems: Erectile dysfunction has improved.  Otherwise, a 12-system review of systems was performed and was negative except as noted in the HPI.  --------------------------------------------------------------------------------------------------  Physical Exam: BP 120/72 (BP Location: Left Arm, Patient Position: Sitting, Cuff Size: Normal)   Pulse 74   Ht 5\' 6"  (1.676 m)   Wt 210 lb (95.3 kg)   SpO2 98%   BMI 33.89 kg/m   General: NAD. HEENT: No conjunctival pallor or scleral icterus. Facemask in place. Neck: No JVD or HJR. Lungs: Normal work of  breathing. Clear to auscultation bilaterally without wheezes or crackles. Heart: Regular rate and rhythm without murmurs, rubs, or gallops. Non-displaced PMI. Abd: Bowel sounds present. Soft, NT/ND without hepatosplenomegaly Ext: No lower extremity edema. Radial, PT, and DP pulses are 2+ bilaterally. Skin: Warm and dry without rash.  EKG: Sinus bradycardia (heart rate 58 bpm).  No significant abnormality.  Lab Results  Component Value Date   WBC 8.6 02/14/2019   HGB 14.7 02/14/2019   HCT 41.8 02/14/2019   MCV 88.7 02/14/2019   PLT 177 02/14/2019    Lab Results  Component Value Date   NA 139 10/07/2019   K 3.8 10/07/2019   CL 101 10/07/2019   CO2 27 10/07/2019   BUN 18 10/07/2019   CREATININE 0.94 10/07/2019   GLUCOSE 110 (H) 10/07/2019   ALT 25 04/23/2019    Lab Results  Component Value Date   CHOL 123 04/23/2019   HDL  37 (L) 04/23/2019   LDLCALC 67 04/23/2019   TRIG 96 04/23/2019   CHOLHDL 3.3 04/23/2019    --------------------------------------------------------------------------------------------------  ASSESSMENT AND PLAN: Coronary artery disease: Marcus Roberts is doing quite well with improvement in his exertional chest discomfort and dyspnea.  He reports one episode of transient palpitations and vague warm feeling in his chest that is nonspecific.  His EKG today is normal.  I recommended that we continue his current antianginal regimen consisting of amlodipine and ranolazine as well as long-term dual antiplatelet therapy with aspirin and clopidogrel.  Hyperlipidemia: LDL has been well controlled.  However, due to myalgias, Marcus Roberts brings up the possibility of transitioning to an alternative statin.  We have agreed to switch from atorvastatin to rosuvastatin 40 mg daily.  We will plan to repeat a lipid panel and ALT in 6 to 8 weeks to assess his response.  Fatigue: This has been chronic but seems stable to slightly better.  We will defer additional testing at this  time.  Follow-up: Return to clinic in 6 months.  Nelva Bush, MD 12/09/2019 1:10 PM

## 2019-12-09 NOTE — Patient Instructions (Signed)
Medication Instructions:  Your physician has recommended you make the following change in your medication:  1- STOP Lipitor. 2- START Crestor 40 mg by mouth once a day.  *If you need a refill on your cardiac medications before your next appointment, please call your pharmacy*  Lab Work: Your physician recommends that you return for lab work in: 6-8 weeks at the [facility].  - LIPID, ALT. - You will need to be fasting. Please do not have anything to eat or drink after midnight the morning you have the lab work. You may only have water or black coffee with no cream or sugar. - Please go to the [facility]. You will check in at the front desk to the right as you walk into the atrium. Valet Parking is offered if needed. - No appointment needed. You may go any day between 7 am and 6 pm.  If you have labs (blood work) drawn today and your tests are completely normal, you will receive your results only by: Marland Kitchen MyChart Message (if you have MyChart) OR . A paper copy in the mail If you have any lab test that is abnormal or we need to change your treatment, we will call you to review the results.  Testing/Procedures: none  Follow-Up: At [facility], you and your health needs are our priority.  As part of our continuing mission to provide you with exceptional heart care, we have created designated Provider Care Teams.  These Care Teams include your primary Cardiologist (physician) and Advanced Practice Providers (APPs -  Physician Assistants and Nurse Practitioners) who all work together to provide you with the care you need, when you need it.  We recommend signing up for the patient portal called "MyChart".  Sign up information is provided on this After Visit Summary.  MyChart is used to connect with patients for Virtual Visits (Telemedicine).  Patients are able to view lab/test results, encounter notes, upcoming appointments, etc.  Non-urgent messages can be sent to your provider as well.    To learn more about what you can do with MyChart, go to [email or web address].    Your next appointment:   6 month(s)  The format for your next appointment:   In Person  Provider:   You may see Nelva Bush, MD or one of the following Advanced Practice Providers on your designated Care Team:    Murray Hodgkins, NP  Christell Faith, PA-C  Marrianne Mood, PA-C  Cadence Bright, [place]

## 2019-12-24 ENCOUNTER — Encounter: Payer: Self-pay | Admitting: Internal Medicine

## 2019-12-24 ENCOUNTER — Ambulatory Visit (INDEPENDENT_AMBULATORY_CARE_PROVIDER_SITE_OTHER): Payer: 59 | Admitting: Internal Medicine

## 2019-12-24 ENCOUNTER — Other Ambulatory Visit: Payer: Self-pay

## 2019-12-24 VITALS — BP 126/80 | HR 73 | Temp 98.3°F | Ht 66.0 in | Wt 212.6 lb

## 2019-12-24 DIAGNOSIS — Z0184 Encounter for antibody response examination: Secondary | ICD-10-CM

## 2019-12-24 DIAGNOSIS — Z1159 Encounter for screening for other viral diseases: Secondary | ICD-10-CM

## 2019-12-24 DIAGNOSIS — H6123 Impacted cerumen, bilateral: Secondary | ICD-10-CM | POA: Insufficient documentation

## 2019-12-24 DIAGNOSIS — K219 Gastro-esophageal reflux disease without esophagitis: Secondary | ICD-10-CM

## 2019-12-24 DIAGNOSIS — F419 Anxiety disorder, unspecified: Secondary | ICD-10-CM | POA: Insufficient documentation

## 2019-12-24 DIAGNOSIS — E669 Obesity, unspecified: Secondary | ICD-10-CM

## 2019-12-24 DIAGNOSIS — M62838 Other muscle spasm: Secondary | ICD-10-CM

## 2019-12-24 DIAGNOSIS — M549 Dorsalgia, unspecified: Secondary | ICD-10-CM | POA: Insufficient documentation

## 2019-12-24 DIAGNOSIS — Z0000 Encounter for general adult medical examination without abnormal findings: Secondary | ICD-10-CM | POA: Insufficient documentation

## 2019-12-24 DIAGNOSIS — R739 Hyperglycemia, unspecified: Secondary | ICD-10-CM

## 2019-12-24 DIAGNOSIS — E559 Vitamin D deficiency, unspecified: Secondary | ICD-10-CM | POA: Insufficient documentation

## 2019-12-24 DIAGNOSIS — Z113 Encounter for screening for infections with a predominantly sexual mode of transmission: Secondary | ICD-10-CM

## 2019-12-24 DIAGNOSIS — Z1329 Encounter for screening for other suspected endocrine disorder: Secondary | ICD-10-CM

## 2019-12-24 DIAGNOSIS — Z1389 Encounter for screening for other disorder: Secondary | ICD-10-CM

## 2019-12-24 DIAGNOSIS — M542 Cervicalgia: Secondary | ICD-10-CM | POA: Insufficient documentation

## 2019-12-24 HISTORY — DX: Cervicalgia: M54.2

## 2019-12-24 MED ORDER — CYCLOBENZAPRINE HCL 10 MG PO TABS: 10.0000 mg | ORAL_TABLET | Freq: Every evening | ORAL | 1 refills | Status: DC | PRN

## 2019-12-24 MED ORDER — DEXLANSOPRAZOLE 30 MG PO CPDR: 30.0000 mg | DELAYED_RELEASE_CAPSULE | Freq: Every day | ORAL | 3 refills | Status: DC

## 2019-12-24 MED ORDER — LORAZEPAM 1 MG PO TABS: 1.0000 mg | ORAL_TABLET | Freq: Every day | ORAL | 5 refills | Status: DC | PRN

## 2019-12-24 NOTE — Progress Notes (Addendum)
Chief Complaint  Patient presents with  . Medication Refill   Annual  1. gerd on protonix 40 and nexium 40 and prilosec and protonix interferes with plavix  2. CAD s/p stent seeing Dr. Saunders Revel on norvasc 5 mg qd, plavix 75 mg qd, lasix 20 mg prn prn ntg ranexa 500 mg q12 and crestor 40 mg qhs  3. Anxiety wants refill of ativan 1 mg qd prn  4. Mid back and neck pain wants refill flexeril  5. B/l cerumen impaction   Review of Systems  Constitutional: Negative for weight loss.  HENT: Negative for hearing loss.   Eyes: Negative for blurred vision.  Respiratory: Negative for shortness of breath.   Cardiovascular: Negative for chest pain.  Gastrointestinal: Negative for abdominal pain.  Musculoskeletal: Positive for back pain and neck pain.  Skin: Negative for rash.  Psychiatric/Behavioral: Negative for depression.   Past Medical History:  Diagnosis Date  . Anxiety   . Aortic atherosclerosis ([place])   . CAD (coronary artery disease)    a. 12/2018 ETT: Ex time 9:39. 23m horiz ST dep in V4-V6 with abnormal coronary CTA. b. Cath 02/2019 with multivessel CAD s/p orbital atherectomy/DES to prox-mid LAD, EF normal.  . Chest pain   . Depression   . GERD (gastroesophageal reflux disease)   . History of echocardiogram    a. 12/2018 Echo: EF 60-65%, no rwma. Mildly dil LA.  .Marland KitchenHyperlipidemia   . Kidney stones    Past Surgical History:  Procedure Laterality Date  . CHOLECYSTECTOMY    . CORONARY ATHERECTOMY N/A 02/13/2019   Procedure: CORONARY ATHERECTOMY;  Surgeon: ENelva Bush MD;  [facility];  Service: Cardiovascular;  Laterality: N/A;  . CORONARY STENT INTERVENTION N/A 02/13/2019   Procedure: CORONARY STENT INTERVENTION;  Surgeon: ENelva Bush MD;  [facility];  Service: Cardiovascular;  Laterality: N/A;  . INTRAVASCULAR PRESSURE WIRE/FFR STUDY N/A 02/13/2019   Procedure: INTRAVASCULAR PRESSURE WIRE/FFR STUDY;  Surgeon: ENelva Bush MD;  [facility];  Service: Cardiovascular;  Laterality: N/A;  . INTRAVASCULAR ULTRASOUND/IVUS N/A 02/13/2019   Procedure: Intravascular Ultrasound/IVUS;  Surgeon: ENelva Bush MD;  [facility];  Service: Cardiovascular;  Laterality: N/A;  . LEFT HEART CATH AND CORONARY ANGIOGRAPHY N/A 02/13/2019   Procedure: LEFT HEART CATH AND CORONARY ANGIOGRAPHY;  Surgeon: ENelva Bush MD;  [facility];  Service: Cardiovascular;  Laterality: N/A;  . vertical sleeve gastrectomy     2013 85% stomach removed was 280 lbs before surgery    Family History  Problem Relation Age of Onset  . Diabetes Father   . Heart disease Father        x2  . Hyperlipidemia Father   . Hypertension Father   . Heart attack Father 430      x2; CABG x 3 11/2019  . Heart disease Brother        MI in 411s . Heart attack Brother 474  Social History   Socioeconomic History  . Marital status: Married    Spouse name: Not on file  . Number of children: Not on file  . Years of education: Not on file  . Highest education level: Not on file  Occupational History  . Occupation: medical coding     Comment: [employer] Tobacco Use  . Smoking status: Never Smoker  . Smokeless tobacco: Never Used  Vaping Use  . Vaping Use: Never used  Substance and  Sexual Activity  . Alcohol use: Yes    Comment: occas. 2-3 x per year   . Drug use: Never  . Sexual activity: Yes    Partners: Female  Other Topics Concern  . Not on file  Social History Narrative   Moved from [place]    Married    Some college    Careers information officer for [employer]   Owns work, wears seat belt, safe in relationship    Social Determinants of Health   Financial Resource Strain:   . Difficulty of Paying Living Expenses: Not on file  Food Insecurity:   . Worried About Charity fundraiser in the Last Year: Not on file  . Ran Out of Food in the Last Year: Not on file  Transportation Needs:   . Lack of Transportation (Medical): Not  on file  . Lack of Transportation (Non-Medical): Not on file  Physical Activity:   . Days of Exercise per Week: Not on file  . Minutes of Exercise per Session: Not on file  Stress:   . Feeling of Stress : Not on file  Social Connections:   . Frequency of Communication with Friends and Family: Not on file  . Frequency of Social Gatherings with Friends and Family: Not on file  . Attends Religious Services: Not on file  . Active Member of Clubs or Organizations: Not on file  . Attends Archivist Meetings: Not on file  . Marital Status: Not on file  Intimate Partner Violence:   . Fear of Current or Ex-Partner: Not on file  . Emotionally Abused: Not on file  . Physically Abused: Not on file  . Sexually Abused: Not on file   Current Meds  Medication Sig  . aspirin EC 81 MG tablet Take 81 mg by mouth daily.   . clopidogrel (PLAVIX) 75 MG tablet Take 1 tablet (75 mg total) by mouth daily. Stop Brilinta.  . cyclobenzaprine (FLEXERIL) 10 MG tablet Take 1 tablet (10 mg total) by mouth at bedtime as needed for muscle spasms.  . fluticasone (FLONASE) 50 MCG/ACT nasal spray Place 1 spray into both nostrils every evening.   Marland Kitchen LORazepam (ATIVAN) 1 MG tablet Take 1 tablet (1 mg total) by mouth daily as needed for anxiety.  . Multiple Vitamin (MULTIVITAMIN WITH MINERALS) TABS tablet Take 1 tablet by mouth 3 (three) times a week.  . nitroGLYCERIN (NITROSTAT) 0.4 MG SL tablet Place 1 tablet (0.4 mg total) under the tongue every 5 (five) minutes as needed for chest pain.  . ranolazine (RANEXA) 500 MG 12 hr tablet Take 1 tablet (500 mg total) by mouth 2 (two) times daily.  . rosuvastatin (CRESTOR) 40 MG tablet Take 1 tablet (40 mg total) by mouth daily.  . tadalafil (CIALIS) 20 MG tablet 1 tab 1 hour prior to intercourse.  Do not take nitroglycerin within 36 hours of taking tadalafil  . [DISCONTINUED] cyclobenzaprine (FLEXERIL) 10 MG tablet Take 10 mg by mouth 2 (two) times daily as needed for  muscle spasms.   . [DISCONTINUED] LORazepam (ATIVAN) 1 MG tablet Take 1 tablet (1 mg total) by mouth daily as needed for anxiety.  . [DISCONTINUED] pantoprazole (PROTONIX) 40 MG tablet Take 1 tablet (40 mg total) by mouth daily. 30 min before breakfast or Dinner   Allergies  Allergen Reactions  . Imdur [Isosorbide Nitrate]     Did not tolerate due to headache   Recent Results (from the past 2160 hour(s))  Basic metabolic panel  Status: Abnormal   Collection Time: 10/07/19  4:36 PM  Result Value Ref Range   Sodium 139 135 - 145 mmol/L   Potassium 3.8 3.5 - 5.1 mmol/L   Chloride 101 98 - 111 mmol/L   CO2 27 22 - 32 mmol/L   Glucose, Bld 110 (H) 70 - 99 mg/dL    Comment: Glucose reference range applies only to samples taken after fasting for at least 8 hours.   BUN 18 6 - 20 mg/dL   Creatinine, Ser 0.94 0.61 - 1.24 mg/dL   Calcium 9.2 8.9 - 10.3 mg/dL   GFR calc non Af Amer >60 >60 mL/min   GFR calc Af Amer >60 >60 mL/min   Anion gap 11 5 - 15    Comment: Performed at [facility], [address] [place],  [address]   Objective  Body mass index is 34.31 kg/m. Wt Readings from Last 3 Encounters:  12/24/19 212 lb 9.6 oz (96.4 kg)  12/09/19 210 lb (95.3 kg)  09/15/19 215 lb (97.5 kg)   Temp Readings from Last 3 Encounters:  12/24/19 98.3 F (36.8 C) (Oral)  02/14/19 98.2 F (36.8 C) (Oral)  02/04/19 98.2 F (36.8 C)   BP Readings from Last 3 Encounters:  12/24/19 126/80  12/09/19 120/72  09/15/19 110/70   Pulse Readings from Last 3 Encounters:  12/24/19 73  12/09/19 74  09/15/19 77    Physical Exam Vitals and nursing note reviewed.  Constitutional:      Appearance: Normal appearance. He is well-developed and well-groomed. He is obese.  HENT:     Head: Normocephalic and atraumatic.     Right Ear: There is impacted cerumen.     Left Ear: There is impacted cerumen.     Mouth/Throat:     Mouth: Mucous membranes are moist.     Pharynx:  Oropharynx is clear.  Cardiovascular:     Rate and Rhythm: Normal rate and regular rhythm.     Heart sounds: Normal heart sounds. No murmur heard.   Pulmonary:     Effort: Pulmonary effort is normal.     Breath sounds: Normal breath sounds.  Abdominal:     Tenderness: There is no abdominal tenderness.  Skin:    General: Skin is warm and dry.  Neurological:     General: No focal deficit present.     Mental Status: He is alert and oriented to person, place, and time. Mental status is at baseline.     Gait: Gait normal.  Psychiatric:        Attention and Perception: Attention and perception normal.        Mood and Affect: Mood and affect normal.        Speech: Speech normal.        Behavior: Behavior normal. Behavior is cooperative.        Thought Content: Thought content normal.        Cognition and Memory: Cognition and memory normal.        Judgment: Judgment normal.     Assessment  Plan  Annual physical exam - Plan:  Fasting labs  Had flu shot had 11/2019  2/2 pfizer  Tdaputd Never smoker  Had MMR and hep B shot 2nd series with work f/u with job titers in future (did not response to 1st 3 series)  Declines urology dermatology for now 2/2 cost  rec D3 5000 IU qd rec healthy diet and exercise   Cerumen impaction b/l with change  in hearing Consented and tolerated with lavage and currette removal all wax removed    Anxiety - Plan: LORazepam (ATIVAN) 1 MG tablet qd prn  Gastroesophageal reflux disease without esophagitis - Plan: Dexlansoprazole 30 MG capsule  Muscle spasm - Plan: cyclobenzaprine (FLEXERIL) 10 MG tablet  Cervicalgia Mid back pain Prn flexeril   04/18/20 see my chart requests STD check full  Agreeable to my chart fee   Provider: Dr. Olivia Mackie McLean-Scocuzza-Internal Medicine

## 2019-12-24 NOTE — Patient Instructions (Addendum)
Pfizer booster 04/23/20   Warm prune juice  Kiwis  miralax or colace (stool softner)  55-64 ounces of water daily  Align or Renew  Debrox ear wax drops   Constipation, Adult Constipation is when a person has fewer bowel movements in a week than normal, has difficulty having a bowel movement, or has stools that are dry, hard, or larger than normal. Constipation may be caused by an underlying condition. It may become worse with age if a person takes certain medicines and does not take in enough fluids. Follow these instructions at home: Eating and drinking   Eat foods that have a lot of fiber, such as fresh fruits and vegetables, whole grains, and beans.  Limit foods that are high in fat, low in fiber, or overly processed, such as french fries, hamburgers, cookies, candies, and soda.  Drink enough fluid to keep your urine clear or pale yellow. General instructions  Exercise regularly or as told by your health care provider.  Go to the restroom when you have the urge to go. Do not hold it in.  Take over-the-counter and prescription medicines only as told by your health care provider. These include any fiber supplements.  Practice pelvic floor retraining exercises, such as deep breathing while relaxing the lower abdomen and pelvic floor relaxation during bowel movements.  Watch your condition for any changes.  Keep all follow-up visits as told by your health care provider. This is important. Contact a health care provider if:  You have pain that gets worse.  You have a fever.  You do not have a bowel movement after 4 days.  You vomit.  You are not hungry.  You lose weight.  You are bleeding from the anus.  You have thin, pencil-like stools. Get help right away if:  You have a fever and your symptoms suddenly get worse.  You leak stool or have blood in your stool.  Your abdomen is bloated.  You have severe pain in your abdomen.  You feel dizzy or you faint. This  information is not intended to replace advice given to you by your health care provider. Make sure you discuss any questions you have with your health care provider. Document Revised: 01/04/2017 Document Reviewed: 07/13/2015 Elsevier Patient Education  2020 [employer].

## 2020-01-18 ENCOUNTER — Encounter: Payer: Self-pay | Admitting: Urology

## 2020-01-19 ENCOUNTER — Other Ambulatory Visit: Payer: Self-pay | Admitting: *Deleted

## 2020-01-19 MED ORDER — TADALAFIL 20 MG PO TABS
ORAL_TABLET | ORAL | 0 refills | Status: DC
Start: 2020-01-19 — End: 2020-06-29

## 2020-01-26 ENCOUNTER — Other Ambulatory Visit (INDEPENDENT_AMBULATORY_CARE_PROVIDER_SITE_OTHER): Payer: 59

## 2020-01-26 ENCOUNTER — Other Ambulatory Visit: Payer: Self-pay

## 2020-01-26 DIAGNOSIS — R739 Hyperglycemia, unspecified: Secondary | ICD-10-CM | POA: Diagnosis not present

## 2020-01-26 DIAGNOSIS — Z1329 Encounter for screening for other suspected endocrine disorder: Secondary | ICD-10-CM | POA: Diagnosis not present

## 2020-01-26 DIAGNOSIS — E559 Vitamin D deficiency, unspecified: Secondary | ICD-10-CM

## 2020-01-26 DIAGNOSIS — Z0000 Encounter for general adult medical examination without abnormal findings: Secondary | ICD-10-CM

## 2020-01-26 DIAGNOSIS — Z1389 Encounter for screening for other disorder: Secondary | ICD-10-CM

## 2020-01-26 DIAGNOSIS — Z1159 Encounter for screening for other viral diseases: Secondary | ICD-10-CM

## 2020-01-26 LAB — COMPREHENSIVE METABOLIC PANEL
ALT: 23 U/L (ref 0–53)
AST: 17 U/L (ref 0–37)
Albumin: 4.3 g/dL (ref 3.5–5.2)
Alkaline Phosphatase: 55 U/L (ref 39–117)
BUN: 7 mg/dL (ref 6–23)
CO2: 31 mEq/L (ref 19–32)
Calcium: 9.4 mg/dL (ref 8.4–10.5)
Chloride: 104 mEq/L (ref 96–112)
Creatinine, Ser: 0.96 mg/dL (ref 0.40–1.50)
GFR: 97.63 mL/min (ref >60.00)
Glucose, Bld: 94 mg/dL (ref 70–99)
Potassium: 4.2 mEq/L (ref 3.5–5.1)
Sodium: 140 mEq/L (ref 135–145)
Total Bilirubin: 0.9 mg/dL (ref 0.2–1.2)
Total Protein: 6.9 g/dL (ref 6.0–8.3)

## 2020-01-26 LAB — LIPID PANEL
Cholesterol: 109 mg/dL (ref 0–200)
HDL: 43.4 mg/dL (ref >39.00)
LDL Cholesterol: 44 mg/dL (ref 0–99)
NonHDL: 65.7
Total CHOL/HDL Ratio: 3
Triglycerides: 111 mg/dL (ref 0.0–149.0)
VLDL: 22.2 mg/dL (ref 0.0–40.0)

## 2020-01-26 LAB — CBC WITH DIFFERENTIAL/PLATELET
Basophils Absolute: 0 10*3/uL (ref 0.0–0.1)
Basophils Relative: 0.6 % (ref 0.0–3.0)
Eosinophils Absolute: 0.1 10*3/uL (ref 0.0–0.7)
Eosinophils Relative: 1.9 % (ref 0.0–5.0)
HCT: 40.4 % (ref 39.0–52.0)
Hemoglobin: 14.3 g/dL (ref 13.0–17.0)
Lymphocytes Relative: 29.1 % (ref 12.0–46.0)
Lymphs Abs: 1.6 10*3/uL (ref 0.7–4.0)
MCHC: 35.3 g/dL (ref 30.0–36.0)
MCV: 90.1 fl (ref 78.0–100.0)
Monocytes Absolute: 0.5 10*3/uL (ref 0.1–1.0)
Monocytes Relative: 8.1 % (ref 3.0–12.0)
Neutro Abs: 3.4 10*3/uL (ref 1.4–7.7)
Neutrophils Relative %: 60.3 % (ref 43.0–77.0)
Platelets: 179 10*3/uL (ref 150.0–400.0)
RBC: 4.48 Mil/uL (ref 4.22–5.81)
RDW: 14 % (ref 11.5–15.5)
WBC: 5.6 10*3/uL (ref 4.0–10.5)

## 2020-01-26 LAB — VITAMIN D 25 HYDROXY (VIT D DEFICIENCY, FRACTURES): VITD: 38.5 ng/mL (ref 30.00–100.00)

## 2020-01-26 LAB — TSH: TSH: 1.73 u[IU]/mL (ref 0.35–4.50)

## 2020-01-26 LAB — HEMOGLOBIN A1C: Hgb A1c MFr Bld: 4.9 % (ref 4.6–6.5)

## 2020-01-27 ENCOUNTER — Other Ambulatory Visit: Payer: Self-pay | Admitting: Internal Medicine

## 2020-01-27 DIAGNOSIS — E785 Hyperlipidemia, unspecified: Secondary | ICD-10-CM

## 2020-01-27 DIAGNOSIS — I25118 Atherosclerotic heart disease of native coronary artery with other forms of angina pectoris: Secondary | ICD-10-CM

## 2020-01-27 NOTE — Addendum Note (Signed)
Addended by: Annia Belt on: 01/27/2020 03:13 PM   Modules accepted: Orders

## 2020-01-28 ENCOUNTER — Other Ambulatory Visit: Payer: Self-pay | Admitting: Internal Medicine

## 2020-01-28 NOTE — Addendum Note (Signed)
Addended by: Leeanne Rio on: 01/28/2020 12:06 PM   Modules accepted: Orders

## 2020-02-12 ENCOUNTER — Ambulatory Visit
Admission: EM | Admit: 2020-02-12 | Discharge: 2020-02-12 | Disposition: A | Discharge status: Home / Self Care | Payer: 59 | Attending: Family Medicine | Admitting: Family Medicine

## 2020-02-12 ENCOUNTER — Other Ambulatory Visit: Payer: Self-pay

## 2020-02-12 DIAGNOSIS — U071 COVID-19: Secondary | ICD-10-CM | POA: Insufficient documentation

## 2020-02-12 DIAGNOSIS — Z20822 Contact with and (suspected) exposure to covid-19: Secondary | ICD-10-CM

## 2020-02-12 DIAGNOSIS — J069 Acute upper respiratory infection, unspecified: Secondary | ICD-10-CM | POA: Diagnosis not present

## 2020-02-12 LAB — SARS CORONAVIRUS 2 (TAT 6-24 HRS): SARS Coronavirus 2: POSITIVE — AB

## 2020-02-12 NOTE — Discharge Instructions (Addendum)
  Due to your chronic conditions and if your covid test is positive, you qualify to receive Monoclonal Antibody infusion and if you don't hear from anyone within 24 hours of finding your results, call [phone]  If your Covid test ends up positive you may take the following supplements to help your immune system be stronger to fight this viral infection Take Quarcetin 500 mg three times a day x 7 days with Zinc 50 mg ones a day x 7 days. The quarcetin is an antiviral and anti-inflammatory supplement which helps open the zinc channels in the cell to absorb Zinc. Zinc helps decrease the virus load in your body. Take Melatonin 6-10 mg at bed time which also helps support your immune system.  Also make sure to take Vit D 5,000 IU per day with a fatty meal and Vit C 5000 mg a day until you are completely better. To prevent viral illnesses your vitamin D should be between 60-80. Stay on Vitamin D 2,000  and C  1000 mg the rest of the season.  Don't lay around, keep active and walk as much as you are able to to prevent worsening of your symptoms.  Follow up with your family Dr next week.  If you get short of breath and you are able to check  your oxygen with a pulse oxygen meter, if it gets to 92% or less, you need to go to the hospital to be admitted. If you dont have one, come back here and we will assess you.

## 2020-02-12 NOTE — ED Provider Notes (Signed)
[facility]    CSN: [id number] Arrival date & time: 02/12/20  0840      History   Chief Complaint Chief Complaint  Patient presents with  . Nasal Congestion    HPI Marcus Roberts is a 45 y.o. male who presents with onset of nose congestion, cough and rhinitis x 3 days. 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 Has had mild hot and cold sensation, but has not checked for a fever. Cough is mostly non productive and Nyquil has helped.     Past Medical History:  Diagnosis Date  . Anxiety   . Aortic atherosclerosis ([place])   . CAD (coronary artery disease)    a. 12/2018 ETT: Ex time 9:39. 61mm horiz ST dep in V4-V6 with abnormal coronary CTA. b. Cath 02/2019 with multivessel CAD s/p orbital atherectomy/DES to prox-mid LAD, EF normal.  . Chest pain   . Depression   . GERD (gastroesophageal reflux disease)   . History of echocardiogram    a. 12/2018 Echo: EF 60-65%, no rwma. Mildly dil LA.  Marland Kitchen Hyperlipidemia   . Kidney stones     Patient Active Problem List   Diagnosis Date Noted  . Annual physical exam 12/24/2019  . Mid back pain 12/24/2019  . Cervicalgia 12/24/2019  . Vitamin D deficiency 12/24/2019  . Bilateral impacted cerumen 12/24/2019  . Anxiety 12/24/2019  . Fatigue 08/06/2019  . Coronary artery disease involving native coronary artery of native heart with angina pectoris ([place]) 02/13/2019  . CAD (coronary artery disease)   . Hyperlipidemia LDL goal <70   . Chest pain 12/11/2018  . Elevated troponin 12/11/2018  . Lower urinary tract symptoms 06/26/2018  . Erectile dysfunction 06/26/2018  . Gastroesophageal reflux disease 06/12/2017  . HLD (hyperlipidemia) 06/12/2017  . Anxiety and depression 06/12/2017  . Obesity (BMI 30.0-34.9) 06/12/2017  . Arthralgia 06/12/2017  . Reduced libido 06/12/2017  . History of kidney stones 06/12/2017  . Multiple nevi 06/12/2017    Past Surgical History:  Procedure  Laterality Date  . CHOLECYSTECTOMY    . CORONARY ATHERECTOMY N/A 02/13/2019   Procedure: CORONARY ATHERECTOMY;  Surgeon: Nelva Bush, MD;  Location: [place] [facility];  Service: Cardiovascular;  Laterality: N/A;  . CORONARY STENT INTERVENTION N/A 02/13/2019   Procedure: CORONARY STENT INTERVENTION;  Surgeon: Nelva Bush, MD;  Location: [place] [facility];  Service: Cardiovascular;  Laterality: N/A;  . INTRAVASCULAR PRESSURE WIRE/FFR STUDY N/A 02/13/2019   Procedure: INTRAVASCULAR PRESSURE WIRE/FFR STUDY;  Surgeon: Nelva Bush, MD;  Location: [place] [facility];  Service: Cardiovascular;  Laterality: N/A;  . INTRAVASCULAR ULTRASOUND/IVUS N/A 02/13/2019   Procedure: Intravascular Ultrasound/IVUS;  Surgeon: Nelva Bush, MD;  Location: [place] [facility];  Service: Cardiovascular;  Laterality: N/A;  . LEFT HEART CATH AND CORONARY ANGIOGRAPHY N/A 02/13/2019   Procedure: LEFT HEART CATH AND CORONARY ANGIOGRAPHY;  Surgeon: Nelva Bush, MD;  Location: [place] [facility];  Service: Cardiovascular;  Laterality: N/A;  . vertical sleeve gastrectomy     2013 85% stomach removed was 280 lbs before surgery        Home Medications    Prior to Admission medications   Medication Sig Start Date End Date Taking? Authorizing Provider  amLODipine (NORVASC) 5 MG tablet TAKE 1 TABLET BY MOUTH EVERY DAY 01/28/20  Yes End, Harrell Gave, MD  aspirin EC 81 MG tablet Take 81 mg by mouth daily.    Yes [provider]  clopidogrel (PLAVIX) 75 MG tablet TAKE 1 TABLET (75 MG TOTAL) BY MOUTH DAILY. STOP BRILINTA. 01/27/20  Yes End, Cristal Deer, MD  fluticasone (FLONASE) 50 MCG/ACT nasal spray Place 1 spray into both nostrils every evening.    Yes [provider]  furosemide (LASIX) 20 MG tablet Take 1 tablet (20 mg total) by mouth daily as needed for edema. 09/15/19 12/14/19 Yes Visser, Jacquelyn D, PA-C  LORazepam (ATIVAN) 1 MG tablet Take 1 tablet (1 mg total) by mouth daily as needed for  anxiety. 12/24/19  Yes McLean-Scocuzza, Pasty Spillers, MD  Multiple Vitamin (MULTIVITAMIN WITH MINERALS) TABS tablet Take 1 tablet by mouth 3 (three) times a week.   Yes [provider]  nitroGLYCERIN (NITROSTAT) 0.4 MG SL tablet Place 1 tablet (0.4 mg total) under the tongue every 5 (five) minutes as needed for chest pain. 02/02/19  Yes End, Cristal Deer, MD  pantoprazole (PROTONIX) 40 MG tablet Take 40 mg by mouth daily.   Yes [provider]  ranolazine (RANEXA) 500 MG 12 hr tablet Take 1 tablet (500 mg total) by mouth 2 (two) times daily. 09/01/19  Yes Visser, Jacquelyn D, PA-C  rosuvastatin (CRESTOR) 40 MG tablet Take 1 tablet (40 mg total) by mouth daily. 12/09/19 03/08/20 Yes End, Cristal Deer, MD  tadalafil (CIALIS) 20 MG tablet 1 tab 1 hour prior to intercourse.  Do not take nitroglycerin within 36 hours of taking tadalafil 01/19/20  Yes Stoioff, Verna Czech, MD  Dexlansoprazole 30 MG capsule Take 1 capsule (30 mg total) by mouth daily. 12/24/19 02/12/20  McLean-Scocuzza, Pasty Spillers, MD    Family History Family History  Problem Relation Age of Onset  . Diabetes Father   . Heart disease Father        x2  . Hyperlipidemia Father   . Hypertension Father   . Heart attack Father 27       x2; CABG x 3 11/2019  . Heart disease Brother        MI in 17s  . Heart attack Brother 66    Social History Social History   Tobacco Use  . Smoking status: Never Smoker  . Smokeless tobacco: Never Used  Vaping Use  . Vaping Use: Never used  Substance Use Topics  . Alcohol use: Yes    Comment: occas. 2-3 x per year   . Drug use: Never     Allergies   Imdur [isosorbide nitrate]   Review of Systems Review of Systems  Constitutional: Positive for appetite change, chills and fatigue. Negative for diaphoresis and fever.  HENT: Positive for congestion, postnasal drip and rhinorrhea. Negative for ear discharge, ear pain, sore throat and trouble swallowing.   Eyes: Negative for discharge.   Respiratory: Positive for cough. Negative for choking and shortness of breath.   Gastrointestinal: Negative for diarrhea, nausea and vomiting.  Musculoskeletal: Positive for myalgias.  Skin: Negative for rash.  Neurological: Positive for headaches.  Hematological: Negative for adenopathy.   Physical Exam Triage Vital Signs ED Triage Vitals  Enc Vitals Group     BP 02/12/20 0909 (!) 122/92     Pulse Rate 02/12/20 0909 77     Resp 02/12/20 0909 18     Temp 02/12/20 0909 98.3 F (36.8 C)     Temp Source 02/12/20 0909 Oral     SpO2 02/12/20 0909 100 %     Weight 02/12/20 0906 200 lb (90.7 kg)     Height 02/12/20 0906 5\' 7"  (1.702 m)  Head Circumference --      Peak Flow --      Pain Score 02/12/20 0906 1     Pain Loc --      Pain Edu? --      Excl. in [place]? --    No data found.  Updated Vital Signs BP (!) 122/92 (BP Location: Left Arm)   Pulse 77   Temp 98.3 F (36.8 C) (Oral)   Resp 18   Ht 5\' 7"  (1.702 m)   Wt 200 lb (90.7 kg)   SpO2 100%   BMI 31.32 kg/m   Visual Acuity Right Eye Distance:   Left Eye Distance:   Bilateral Distance:    Right Eye Near:   Left Eye Near:    Bilateral Near:     Physical Exam Physical Exam Vitals signs and nursing note reviewed.  Constitutional:      General: he is not in acute distress.    Appearance: Normal appearance. he is not ill-appearing, toxic-appearing or diaphoretic.  HENT:     Head: Normocephalic.     Right Ear: Tympanic membrane, ear canal and external ear normal.     Left Ear: Tympanic membrane, ear canal and external ear normal.     Nose: with mild mucosa congestion and clear mucous    Mouth/Throat: clear    Mouth: Mucous membranes are moist.  Eyes:     General: No scleral icterus.       Right eye: No discharge.        Left eye: No discharge.     Conjunctiva/sclera: Conjunctivae normal.  Neck:     Musculoskeletal: Neck supple. No neck rigidity.  Cardiovascular:     Rate and Rhythm: Normal rate and  regular rhythm.     Heart sounds: No murmur.  Pulmonary:     Effort: Pulmonary effort is normal.     Breath sounds: Normal breath sounds.    Musculoskeletal: Normal range of motion.  Lymphadenopathy:     Cervical: No cervical adenopathy.  Skin:    General: Skin is warm and dry.     Coloration: Skin is not jaundiced.     Findings: No rash.  Neurological:     Mental Status: he is alert and oriented to person, place, and time.     Gait: Gait normal.  Psychiatric:        Mood and Affect: Mood normal.        Behavior: Behavior normal.        Thought Content: Thought content normal.        Judgment: Judgment normal.     UC Treatments / Results  Labs (all labs ordered are listed, but only abnormal results are displayed) Labs Reviewed  SARS CORONAVIRUS 2 (TAT 6-24 HRS)    EKG   Radiology No results found.  Procedures Procedures (including critical care time)  Medications Ordered in UC Medications - No data to display  Initial Impression / Assessment and Plan / UC Course  I have reviewed the triage vital signs and the nursing notes. Viral URI. Covid test is pending. Supportive care advised, see instructions.  Final Clinical Impressions(s) / UC Diagnoses   Final diagnoses:  Upper respiratory tract infection, unspecified type  Suspected COVID-19 virus infection     Discharge Instructions      Due to your chronic conditions and if your covid test is positive, you qualify to receive Monoclonal Antibody infusion and if you don't hear from anyone within 24 hours of finding  your results, call [phone]  If your Covid test ends up positive you may take the following supplements to help your immune system be stronger to fight this viral infection Take Quarcetin 500 mg three times a day x 7 days with Zinc 50 mg ones a day x 7 days. The quarcetin is an antiviral and anti-inflammatory supplement which helps open the zinc channels in the cell to absorb Zinc. Zinc helps  decrease the virus load in your body. Take Melatonin 6-10 mg at bed time which also helps support your immune system.  Also make sure to take Vit D 5,000 IU per day with a fatty meal and Vit C 5000 mg a day until you are completely better. To prevent viral illnesses your vitamin D should be between 60-80. Stay on Vitamin D 2,000  and C  1000 mg the rest of the season.  Don't lay around, keep active and walk as much as you are able to to prevent worsening of your symptoms.  Follow up with your family Dr next week.  If you get short of breath and you are able to check  your oxygen with a pulse oxygen meter, if it gets to 92% or less, you need to go to the hospital to be admitted. If you dont have one, come back here and we will assess you.      ED Prescriptions    None     PDMP not reviewed this encounter.   Shelby Mattocks, [place] 02/12/20 [phone]

## 2020-02-12 NOTE — ED Triage Notes (Signed)
Patient states that he has been having nasal congestion, cough and runny nose x 3 days.

## 2020-02-25 ENCOUNTER — Other Ambulatory Visit: Payer: Self-pay | Admitting: Internal Medicine

## 2020-03-18 ENCOUNTER — Other Ambulatory Visit: Payer: Self-pay | Admitting: Internal Medicine

## 2020-03-18 DIAGNOSIS — K219 Gastro-esophageal reflux disease without esophagitis: Secondary | ICD-10-CM

## 2020-03-22 ENCOUNTER — Other Ambulatory Visit: Payer: Self-pay

## 2020-03-22 MED ORDER — RANOLAZINE ER 500 MG PO TB12: 500.0000 mg | ORAL_TABLET | Freq: Two times a day (BID) | ORAL | 3 refills | Status: DC

## 2020-03-22 NOTE — Telephone Encounter (Signed)
Ranolazine 500 mg resent to [facility] with 90 day supply with 3 refills.

## 2020-04-18 ENCOUNTER — Other Ambulatory Visit (INDEPENDENT_AMBULATORY_CARE_PROVIDER_SITE_OTHER): Payer: 59

## 2020-04-18 ENCOUNTER — Encounter: Payer: 59 | Admitting: Internal Medicine

## 2020-04-18 ENCOUNTER — Other Ambulatory Visit (HOSPITAL_COMMUNITY)
Admission: RE | Admit: 2020-04-18 | Discharge: 2020-04-18 | Disposition: A | Discharge status: Home / Self Care | Payer: 59 | Source: Ambulatory Visit | Attending: Internal Medicine | Admitting: Internal Medicine

## 2020-04-18 ENCOUNTER — Other Ambulatory Visit: Payer: Self-pay

## 2020-04-18 DIAGNOSIS — Z113 Encounter for screening for infections with a predominantly sexual mode of transmission: Secondary | ICD-10-CM | POA: Diagnosis not present

## 2020-04-18 DIAGNOSIS — Z0184 Encounter for antibody response examination: Secondary | ICD-10-CM

## 2020-04-18 NOTE — Telephone Encounter (Signed)
My chart encounter  Marcus Roberts  on 04/18/20 at 10:58 am with Marcus Roberts by my chart    The patient is agreeable to my chart fee for STD check full panel outside of his visit from last visit 12/2019   HPI: Pt wishes STD screening full panel  ROS: See pertinent positives and negatives per HPI.  Past Medical History:  Diagnosis Date  . Anxiety   . Aortic atherosclerosis ([place])   . CAD (coronary artery disease)    a. 12/2018 ETT: Ex time 9:39. 65mm horiz ST dep in V4-V6 with abnormal coronary CTA. b. Cath 02/2019 with multivessel CAD s/p orbital atherectomy/DES to prox-mid LAD, EF normal.  . Chest pain   . Depression   . GERD (gastroesophageal reflux disease)   . History of echocardiogram    a. 12/2018 Echo: EF 60-65%, no rwma. Mildly dil LA.  Marland Kitchen Hyperlipidemia   . Kidney stones     Past Surgical History:  Procedure Laterality Date  . CHOLECYSTECTOMY    . CORONARY ATHERECTOMY N/A 02/13/2019   Procedure: CORONARY ATHERECTOMY;  Surgeon: Nelva Bush, MD;  Location: [place] [facility];  Service: Cardiovascular;  Laterality: N/A;  . CORONARY STENT INTERVENTION N/A 02/13/2019   Procedure: CORONARY STENT INTERVENTION;  Surgeon: Nelva Bush, MD;  Location: [place] [facility];  Service: Cardiovascular;  Laterality: N/A;  . INTRAVASCULAR PRESSURE WIRE/FFR STUDY N/A 02/13/2019   Procedure: INTRAVASCULAR PRESSURE WIRE/FFR STUDY;  Surgeon: Nelva Bush, MD;  Location: [place] [facility];  Service: Cardiovascular;  Laterality: N/A;  . INTRAVASCULAR ULTRASOUND/IVUS N/A 02/13/2019   Procedure: Intravascular Ultrasound/IVUS;  Surgeon: Nelva Bush, MD;  Location: [place] [facility];  Service: Cardiovascular;  Laterality: N/A;  . LEFT HEART CATH AND CORONARY ANGIOGRAPHY N/A 02/13/2019   Procedure: LEFT HEART CATH AND CORONARY ANGIOGRAPHY;  Surgeon: Nelva Bush, MD;  Location: [place] [facility];  Service: Cardiovascular;  Laterality: N/A;  . vertical sleeve gastrectomy     2013 85%  stomach removed was 280 lbs before surgery      Current Outpatient Medications:  .  amLODipine (NORVASC) 5 MG tablet, TAKE 1 TABLET BY MOUTH EVERY DAY, Disp: 90 tablet, Rfl: 3 .  aspirin EC 81 MG tablet, Take 81 mg by mouth daily. , Disp: , Rfl:  .  clopidogrel (PLAVIX) 75 MG tablet, TAKE 1 TABLET (75 MG TOTAL) BY MOUTH DAILY. STOP BRILINTA., Disp: 90 tablet, Rfl: 1 .  fluticasone (FLONASE) 50 MCG/ACT nasal spray, Place 1 spray into both nostrils every evening. , Disp: , Rfl:  .  furosemide (LASIX) 20 MG tablet, Take 1 tablet (20 mg total) by mouth daily as needed for edema., Disp: 90 tablet, Rfl: 3 .  LORazepam (ATIVAN) 1 MG tablet, Take 1 tablet (1 mg total) by mouth daily as needed for anxiety., Disp: 30 tablet, Rfl: 5 .  Multiple Vitamin (MULTIVITAMIN WITH MINERALS) TABS tablet, Take 1 tablet by mouth 3 (three) times a week., Disp: , Rfl:  .  nitroGLYCERIN (NITROSTAT) 0.4 MG SL tablet, PLACE 1 TABLET (0.4 MG TOTAL) UNDER THE TONGUE EVERY 5 (FIVE) MINUTES AS NEEDED FOR CHEST PAIN., Disp: 25 tablet, Rfl: 1 .  pantoprazole (PROTONIX) 40 MG tablet, TAKE 1 TABLET (40 MG TOTAL) BY MOUTH DAILY. 30 MIN BEFORE BREAKFAST OR DINNER *STOP NEXIUM, Disp: 90 tablet, Rfl: 3 .  ranolazine (RANEXA) 500 MG 12 hr tablet, Take 1 tablet (500 mg total) by mouth 2 (two) times daily., Disp: 180 tablet, Rfl: 3 .  rosuvastatin (CRESTOR)  40 MG tablet, Take 1 tablet (40 mg total) by mouth daily., Disp: 90 tablet, Rfl: 2 .  tadalafil (CIALIS) 20 MG tablet, 1 tab 1 hour prior to intercourse.  Do not take nitroglycerin within 36 hours of taking tadalafil, Disp: 30 tablet, Rfl: 0  EXAM: None   ASSESSMENT AND PLAN:  Discussed the following assessment and plan:  Screening examination for venereal disease Check GCT/RPR, HIV/hep B/C, HSV 1 and 2   Pt Agreeable to my chart fee  I discussed the assessment and treatment plan with the patient. The patient was provided an opportunity to ask questions and all were answered.  The patient agreed with the plan and demonstrated an understanding of the instructions.    5-10 min Marcus Jackson, MD

## 2020-04-18 NOTE — Addendum Note (Signed)
Addended by: Orland Mustard on: 04/18/2020 10:55 AM   Modules accepted: Orders

## 2020-04-19 LAB — HSV(HERPES SMPLX)ABS-I+II(IGG+IGM)-BLD
HSV 1 Glycoprotein G Ab, IgG: >62.2 index — ABNORMAL HIGH (ref 0.00–0.90)
HSV 2 IgG, Type Spec: <0.91 index (ref 0.00–0.90)
HSVI/II Comb IgM: <0.91 Ratio (ref 0.00–0.90)

## 2020-04-19 LAB — RPR: RPR Ser Ql: NONREACTIVE

## 2020-04-19 LAB — HIV ANTIBODY (ROUTINE TESTING W REFLEX): HIV Screen 4th Generation wRfx: NONREACTIVE

## 2020-04-19 LAB — HEPATITIS B SURFACE ANTIGEN: Hepatitis B Surface Ag: NEGATIVE

## 2020-04-19 LAB — HEPATITIS B SURFACE ANTIBODY, QUANTITATIVE: Hepatitis B Surf Ab Quant: <3.1 m[IU]/mL — ABNORMAL LOW (ref >9.9)

## 2020-04-19 LAB — HEPATITIS C ANTIBODY: Hep C Virus Ab: 0.2 s/co ratio (ref 0.0–0.9)

## 2020-04-20 LAB — URINE CYTOLOGY ANCILLARY ONLY
Bacterial Vaginitis-Urine: NEGATIVE
Chlamydia: NEGATIVE
Comment: NEGATIVE
Comment: NEGATIVE
Comment: NORMAL
Neisseria Gonorrhea: NEGATIVE
Trichomonas: NEGATIVE

## 2020-05-31 ENCOUNTER — Telehealth: Payer: Self-pay | Admitting: Internal Medicine

## 2020-05-31 NOTE — Telephone Encounter (Signed)
Attempted to call the patient. No answer- I left a message to please call back if we could be of assistance this afternoon, otherwise we will see him tomorrow as scheduled.

## 2020-05-31 NOTE — Telephone Encounter (Signed)
Pt c/o of Chest Pain: STAT if CP now or developed within 24 hours  1. Are you having CP right now? Not pain, just discomfort  2. Are you experiencing any other symptoms (ex. SOB, nausea, vomiting, sweating)? Squeezing pain in chest, SOB, dizziness   3. How long have you been experiencing CP? Discomfort over the past month, SOB over the weekend and last night dizziness  4. Is your CP continuous or coming and going? Coming and going  5. Have you taken Nitroglycerin? no ? Scheduled for 4/27

## 2020-06-01 ENCOUNTER — Encounter: Payer: Self-pay | Admitting: Family

## 2020-06-01 ENCOUNTER — Ambulatory Visit: Payer: 59 | Admitting: Family

## 2020-06-01 ENCOUNTER — Other Ambulatory Visit: Payer: Self-pay

## 2020-06-01 VITALS — BP 118/80 | HR 65 | Ht 67.0 in | Wt 208.0 lb

## 2020-06-01 DIAGNOSIS — R0600 Dyspnea, unspecified: Secondary | ICD-10-CM | POA: Diagnosis not present

## 2020-06-01 DIAGNOSIS — R0789 Other chest pain: Secondary | ICD-10-CM

## 2020-06-01 DIAGNOSIS — R0609 Other forms of dyspnea: Secondary | ICD-10-CM

## 2020-06-01 DIAGNOSIS — E785 Hyperlipidemia, unspecified: Secondary | ICD-10-CM | POA: Diagnosis not present

## 2020-06-01 DIAGNOSIS — I25118 Atherosclerotic heart disease of native coronary artery with other forms of angina pectoris: Secondary | ICD-10-CM | POA: Diagnosis not present

## 2020-06-01 DIAGNOSIS — R42 Dizziness and giddiness: Secondary | ICD-10-CM

## 2020-06-01 DIAGNOSIS — R0602 Shortness of breath: Secondary | ICD-10-CM

## 2020-06-01 MED ORDER — ROSUVASTATIN CALCIUM 40 MG PO TABS: 20.0000 mg | ORAL_TABLET | Freq: Every day | ORAL | 2 refills | Status: DC

## 2020-06-01 MED ORDER — RANOLAZINE ER 1000 MG PO TB12: 1000.0000 mg | ORAL_TABLET | Freq: Two times a day (BID) | ORAL | 5 refills | Status: DC

## 2020-06-01 NOTE — Patient Instructions (Addendum)
Medication Instructions:  Your physician has recommended you make the following change in your medication:   CHANGE Ranolazine to 1000mg  twice daily  CHANGE Rosuvastatin to 20mg  daily  *If you need a refill on your cardiac medications before your next appointment, please call your pharmacy*   Lab Work: Your provider recommends  lab work today: CMP, CBC, TSH, CRP, sed  If you have labs (blood work) drawn today and your tests are completely normal, you will receive your results only by: Marland Kitchen MyChart Message (if you have MyChart) OR . A paper copy in the mail If you have any lab test that is abnormal or we need to change your treatment, we will call you to review the results.   Testing/Procedures: Your EKG today shows normal sinus rhythm.   Follow-Up: At [facility], you and your health needs are our priority.  As part of our continuing mission to provide you with exceptional heart care, we have created designated Provider Care Teams.  These Care Teams include your primary Cardiologist (physician) and Advanced Practice Providers (APPs -  Physician Assistants and Nurse Practitioners) who all work together to provide you with the care you need, when you need it.  We recommend signing up for the patient portal called "MyChart".  Sign up information is provided on this After Visit Summary.  MyChart is used to connect with patients for Virtual Visits (Telemedicine).  Patients are able to view lab/test results, encounter notes, upcoming appointments, etc.  Non-urgent messages can be sent to your provider as well.   To learn more about what you can do with MyChart, go to [email or web address].    Your next appointment:   1-2 week(s)  The format for your next appointment:   In Person  Provider:   You may see Nelva Bush, MD or one of the following Advanced Practice Providers on your designated Care Team:    Murray Hodgkins, NP  Christell Faith, PA-C  Marrianne Mood, PA-C  [place]  Kathlen Mody, [place]  Laurann [place], NP  Other Instructions  Heart Healthy Diet Recommendations: A low-salt diet is recommended. Meats should be grilled, baked, or boiled. Avoid fried foods. Focus on lean protein sources like fish or chicken with vegetables and fruits. The American Heart Association is a Microbiologist!  American Heart Association Diet and Lifeystyle Recommendations   Exercise recommendations: The American Heart Association recommends 150 minutes of moderate intensity exercise weekly. Try 30 minutes of moderate intensity exercise 4-5 times per week. This could include walking, jogging, or swimming.

## 2020-06-01 NOTE — Progress Notes (Signed)
Office Visit    Patient Name: Marcus Roberts Date of Encounter: 06/01/2020  PCP:  McLean-Scocuzza, Nino Glow, MD   [place]  Cardiologist:  Nelva Bush, MD  Advanced Practice Provider:  No care team member to display Electrophysiologist:  None   Chief Complaint    Marcus Roberts is a 45 y.o. male with a hx of CAD s/p PCI to prox/mid LAD 02/2019 residual disease in LCx and RCA, GERD, obesity ss/p gastric sleep, nephrolithiasis, anxiety/depression presents today for chest pain.   Past Medical History    Past Medical History:  Diagnosis Date  . Anxiety   . Aortic atherosclerosis ([place])   . CAD (coronary artery disease)    a. 12/2018 ETT: Ex time 9:39. 43m horiz ST dep in V4-V6 with abnormal coronary CTA. b. Cath 02/2019 with multivessel CAD s/p orbital atherectomy/DES to prox-mid LAD, EF normal.  . Chest pain   . Depression   . GERD (gastroesophageal reflux disease)   . History of echocardiogram    a. 12/2018 Echo: EF 60-65%, no rwma. Mildly dil LA.  .Marland KitchenHyperlipidemia   . Kidney stones    Past Surgical History:  Procedure Laterality Date  . CHOLECYSTECTOMY    . CORONARY ATHERECTOMY N/A 02/13/2019   Procedure: CORONARY ATHERECTOMY;  Surgeon: ENelva Bush MD;  [facility];  Service: Cardiovascular;  Laterality: N/A;  . CORONARY STENT INTERVENTION N/A 02/13/2019   Procedure: CORONARY STENT INTERVENTION;  Surgeon: ENelva Bush MD;  [facility];  Service: Cardiovascular;  Laterality: N/A;  . INTRAVASCULAR PRESSURE WIRE/FFR STUDY N/A 02/13/2019   Procedure: INTRAVASCULAR PRESSURE WIRE/FFR STUDY;  Surgeon: ENelva Bush MD;  [facility];  Service: Cardiovascular;  Laterality: N/A;  . INTRAVASCULAR ULTRASOUND/IVUS N/A 02/13/2019   Procedure: Intravascular Ultrasound/IVUS;  Surgeon: ENelva Bush MD;  [facility];  Service: Cardiovascular;  Laterality: N/A;  . LEFT HEART CATH AND  CORONARY ANGIOGRAPHY N/A 02/13/2019   Procedure: LEFT HEART CATH AND CORONARY ANGIOGRAPHY;  Surgeon: ENelva Bush MD;  [facility];  Service: Cardiovascular;  Laterality: N/A;  . vertical sleeve gastrectomy     2013 85% stomach removed was 280 lbs before surgery     Allergies  Allergies  Allergen Reactions  . Imdur [Isosorbide Nitrate]     Did not tolerate due to headache    History of Present Illness    Marcus HARMESis a 44y.o. male with a hx of  CAD s/p PCI to prox/mid LAD 02/2019 residual disease in LCx and RCA, GERD, obesity ss/p gastric sleep, nephrolithiasis, anxiety/depression  last seen 12/24/19.  Evaluated in November 2020 for chest tightness. Underwent exercise treadmill testing November notable for good exercise tolerance (walked 9:39) however developed 288mhorizontal ST segment depression in leads V4 through V6. Subsequent cardiac CT 02/02/19 with calcium score 923, placing him in the 99th percentile for age/sex. Noted >70% prox and mid stenosis in LAD and distal RCA. Imdur added as outpatient and atorvastatin up titrated. Echo 12/2018 with LVEF 60-65%, normal RV, mildly dilated LA. He was recommended for cardiac catheterization.    Admitted 02/23/19 for cardiac catheterization. Cardiac cath with 3-vessel CAD (60% prox, 90% mid, 40% distal LAD - CTO of mid LCx with L to L collaterals, 40 and 60% mid/distal RCA stenosis borderine hemodynamically significant by FFFR, CTO RCA with left to right collaterals). Underwent atherectomy and PCI to prox and mid LAD with Synergy  3.0 x 47m DES. Recommended for DAPT Brilinta and aspirin for at least 6 months, ideally longer. Imdur was discontinued due to headache. Brilinta was transitioned to Plavix due to shortness of breath.   He was seen 09/2019 noting increasing dyspnea though this had improved at follow up 12/09/19 and no further imaging or testing was recommended.   He presents today for follow up with myriad of  concerns. Tells me yesterday while sitting at work he had an episode of lightheadedness. He did not check his blood pressure. Tells me he felt "disoriented". These episodes of lightheadedness have been happening occasionally and are normally associated with shortness of breath.  He notes some shortness of breath over the last week both at rest and with activity. No cough, wheeze, sick contacts. Endorses feeling more "aches" in his chest. Tells me it is noticeable but not stopping him from doing anything. Notices on left, mid, and right chest. Tells me he woke up the other night with chest pain. No palpitations at that time.   Tells me in his head he can pinpoint spots along his head that hurt. Tells me it started around 02/2019. Describes as an ache. Is worried there is an artery cause or that it is due to a medication. Also worries about stroke. No noted aggravating nor relieving factors. Tells me this feels different than a headache. Reports no neck injuries. Reassurance provided that this was not similar to stroke symptoms and he should follow up with PCP.  He has been maintained on his Rosuvastatin 451m He asks if this could cause brain fog or poor concentration. We discussed that this is not a common side effect. [place] to school for his Bachelor's to study cyber security and feels as if he can't focus.   Eating mostly plant based, heart healthy diet. No formal exercise routine.   EKGs/Labs/Other Studies Reviewed:   The following studies were reviewed today:  Cardiac cath 02/13/19 Conclusions: 1. Significant three-vessel coronary artery disease, including sequential 60% proximal, 90% mid, and 40% distal LAD stenoses, chronic total occlusion of mid LCx with left to left collaterals, sequential 40 and 60% mid/distal RCA stenoses that are borderline hemodynamically significant by FFR, and chronic total occlusion of RCA continuation with left-to-right collaterals. 2. Normal left ventricular systolic  function with mildly elevated filling pressure. 3. Successful IVUS guided orbital atherectomy and drug-eluting stent placement to the proximal and mid LAD using Synergy 3.0 x 38 mm drug-eluting stent with 0% residual stenosis and TIMI-3 flow.   Recommendations: 1. Overnight extended recovery. 2. Dual antiplatelet therapy with aspirin and ticagrelor for at least 6 months, ideally longer. 3. Aggressive secondary prevention, including high intensity statin therapy.   Echo 12/26/18  1. Left ventricular ejection fraction, by visual estimation, is 60 to 65%. The left ventricle has normal function. There is no left ventricular hypertrophy.  2. Global right ventricle has normal systolic function.The right ventricular size is normal. No increase in right ventricular wall thickness.  3. Left atrial size was mildly dilated.  4. TR signal is inadequate for assessing pulmonary artery systolic pressure.    EKG:  EKG is ordered today.  The ekg ordered today demonstrates NSR 65 bpm with no acute St/T wave changes.  Recent Labs: 01/26/2020: ALT 23; BUN 7; Creatinine, Ser 0.96; Hemoglobin 14.3; Platelets 179.0; Potassium 4.2; Sodium 140; TSH 1.73  Recent Lipid Panel    Component Value Date/Time   CHOL 109 01/26/2020 0803   TRIG 111.0 01/26/2020 0803   HDL  43.40 01/26/2020 0803   CHOLHDL 3 01/26/2020 0803   VLDL 22.2 01/26/2020 0803   LDLCALC 44 01/26/2020 0803     Home Medications   Current Meds  Medication Sig  . amLODipine (NORVASC) 5 MG tablet TAKE 1 TABLET BY MOUTH EVERY DAY  . aspirin EC 81 MG tablet Take 81 mg by mouth daily.   . clopidogrel (PLAVIX) 75 MG tablet TAKE 1 TABLET (75 MG TOTAL) BY MOUTH DAILY. STOP BRILINTA.  Marland Kitchen cyclobenzaprine (FLEXERIL) 10 MG tablet TAKE 1 TABLET BY MOUTH AT BEDTIME AS NEEDED FOR MUSCLE SPASMS  . DENTA 5000 PLUS 1.1 % CREA dental cream See admin instructions.  . fluticasone (FLONASE) 50 MCG/ACT nasal spray Place 1 spray into both nostrils every evening.   .  furosemide (LASIX) 20 MG tablet Take 1 tablet (20 mg total) by mouth daily as needed for edema.  Marland Kitchen LORazepam (ATIVAN) 1 MG tablet Take 1 tablet (1 mg total) by mouth daily as needed for anxiety.  . Multiple Vitamin (MULTIVITAMIN WITH MINERALS) TABS tablet Take 1 tablet by mouth 3 (three) times a week.  . nitroGLYCERIN (NITROSTAT) 0.4 MG SL tablet PLACE 1 TABLET (0.4 MG TOTAL) UNDER THE TONGUE EVERY 5 (FIVE) MINUTES AS NEEDED FOR CHEST PAIN.  Marland Kitchen pantoprazole (PROTONIX) 40 MG tablet TAKE 1 TABLET (40 MG TOTAL) BY MOUTH DAILY. [place] OR DINNER *STOP NEXIUM  . ranolazine (RANEXA) 500 MG 12 hr tablet Take 1 tablet (500 mg total) by mouth 2 (two) times daily.  . rosuvastatin (CRESTOR) 40 MG tablet Take 1 tablet (40 mg total) by mouth daily.  . tadalafil (CIALIS) 20 MG tablet 1 tab 1 hour prior to intercourse.  Do not take nitroglycerin within 36 hours of taking tadalafil     Review of Systems   All other systems reviewed and are otherwise negative except as noted above.  Physical Exam    VS:  BP 118/80 (BP Location: Left Arm, Patient Position: Sitting, Cuff Size: Normal)   Pulse 65   Ht 5' 7"  (1.702 m)   Wt 208 lb (94.3 kg)   SpO2 99%   BMI 32.58 kg/m  , BMI Body mass index is 32.58 kg/m.  Wt Readings from Last 3 Encounters:  06/01/20 208 lb (94.3 kg)  02/12/20 200 lb (90.7 kg)  12/24/19 212 lb 9.6 oz (96.4 kg)    GEN: Well nourished, well developed, in no acute distress. HEENT: normal. Neck: Supple, no JVD, carotid bruits, or masses. Cardiac: RRR, no murmurs, rubs, or gallops. No clubbing, cyanosis, edema.  Radials/PT 2+ and equal bilaterally.  Respiratory:  Respirations regular and unlabored, clear to auscultation bilaterally. GI: Soft, nontender, nondistended. MS: No deformity or atrophy. Skin: Warm and dry, no rash. Neuro:  Strength and sensation are intact. Psych: Normal affect.  Assessment & Plan    1. CAD / Dyspnea / Lightheadedness - Increasing dyspnea over  last week and intermittent chest discomfort occurring at rest. Reports intermittent episodes of lightheadedness. Symptoms are somewhat atypical for angina as they occur at rest. EKG NSR no acute ST/T wave changes.  Increase Ranexa to 102m BID. Additional GDMT includes aspirin, amlodipine, crestor PRN nitroglycerin. CMP, CBC, TSH, CRP, sed rate to rule out inflammation, thyroid abnormality, anemia as contributory to symptoms. If workup unrevealing, consider repeat R/LHC. Prompt follow up in 1-2 weeks.   2. Anxiety - Likely contributory to symptoms. Continue to follow with PCP. Reassurane provided.   3. HTN - BP well controlled. Continue current antihypertensive  regimen.   4. HLD, LDl goal <70 - Intolerant to Atorvastatin. Tolerating Rosuvastatin 50m QD. Wishes to trial lower dose, reduce to Crestor 235mQD with repeat CMP/lipid in 3 months.   Disposition: Follow up in 1-2 week(s) with Dr. EnSaunders Revelr APP   Signed, CaLoel DubonnetNP 06/01/2020, 10:14 AM [place]

## 2020-06-02 LAB — COMPREHENSIVE METABOLIC PANEL
ALT: 35 IU/L (ref 0–44)
AST: 29 IU/L (ref 0–40)
Albumin/Globulin Ratio: 2.3 — ABNORMAL HIGH (ref 1.2–2.2)
Albumin: 4.9 g/dL (ref 4.0–5.0)
Alkaline Phosphatase: 70 IU/L (ref 44–121)
BUN/Creatinine Ratio: 9 (ref 9–20)
BUN: 9 mg/dL (ref 6–24)
Bilirubin Total: 0.7 mg/dL (ref 0.0–1.2)
CO2: 26 mmol/L (ref 20–29)
Calcium: 9.8 mg/dL (ref 8.7–10.2)
Chloride: 100 mmol/L (ref 96–106)
Creatinine, Ser: 1.03 mg/dL (ref 0.76–1.27)
Globulin, Total: 2.1 g/dL (ref 1.5–4.5)
Glucose: 88 mg/dL (ref 65–99)
Potassium: 4.7 mmol/L (ref 3.5–5.2)
Sodium: 141 mmol/L (ref 134–144)
Total Protein: 7 g/dL (ref 6.0–8.5)
eGFR: 93 mL/min/{1.73_m2} (ref >59)

## 2020-06-02 LAB — C-REACTIVE PROTEIN: CRP: <1 mg/L (ref 0–10)

## 2020-06-02 LAB — CBC
Hematocrit: 42.9 % (ref 37.5–51.0)
Hemoglobin: 15.2 g/dL (ref 13.0–17.7)
MCH: 32.3 pg (ref 26.6–33.0)
MCHC: 35.4 g/dL (ref 31.5–35.7)
MCV: 91 fL (ref 79–97)
Platelets: 212 10*3/uL (ref 150–450)
RBC: 4.7 x10E6/uL (ref 4.14–5.80)
RDW: 12.2 % (ref 11.6–15.4)
WBC: 5.3 10*3/uL (ref 3.4–10.8)

## 2020-06-02 LAB — TSH: TSH: 2.47 u[IU]/mL (ref 0.450–4.500)

## 2020-06-02 LAB — SEDIMENTATION RATE: Sed Rate: 3 mm/hr (ref 0–15)

## 2020-06-14 ENCOUNTER — Encounter: Payer: Self-pay | Admitting: Family

## 2020-06-14 ENCOUNTER — Ambulatory Visit: Payer: 59 | Admitting: Family

## 2020-06-14 ENCOUNTER — Other Ambulatory Visit: Payer: Self-pay

## 2020-06-14 VITALS — BP 112/70 | HR 68 | Ht 67.0 in | Wt 210.0 lb

## 2020-06-14 DIAGNOSIS — R0600 Dyspnea, unspecified: Secondary | ICD-10-CM

## 2020-06-14 DIAGNOSIS — I25118 Atherosclerotic heart disease of native coronary artery with other forms of angina pectoris: Secondary | ICD-10-CM | POA: Diagnosis not present

## 2020-06-14 DIAGNOSIS — E785 Hyperlipidemia, unspecified: Secondary | ICD-10-CM

## 2020-06-14 DIAGNOSIS — R0609 Other forms of dyspnea: Secondary | ICD-10-CM

## 2020-06-14 DIAGNOSIS — I10 Essential (primary) hypertension: Secondary | ICD-10-CM

## 2020-06-14 DIAGNOSIS — R42 Dizziness and giddiness: Secondary | ICD-10-CM | POA: Diagnosis not present

## 2020-06-14 NOTE — Patient Instructions (Addendum)
Medication Instructions:  Continue your current medications.  *If you need a refill on your cardiac medications before your next appointment, please call your pharmacy*  Lab Work: None ordered today.  Testing/Procedures: None ordered today.  Follow-Up: At [facility], you and your health needs are our priority.  As part of our continuing mission to provide you with exceptional heart care, we have created designated Provider Care Teams.  These Care Teams include your primary Cardiologist (physician) and Advanced Practice Providers (APPs -  Physician Assistants and Nurse Practitioners) who all work together to provide you with the care you need, when you need it.  We recommend signing up for the patient portal called "MyChart".  Sign up information is provided on this After Visit Summary.  MyChart is used to connect with patients for Virtual Visits (Telemedicine).  Patients are able to view lab/test results, encounter notes, upcoming appointments, etc.  Non-urgent messages can be sent to your provider as well.   To learn more about what you can do with MyChart, go to [email or web address].    Your next appointment:   4 month(s)  The format for your next appointment:   In Person  Provider:   You may see Nelva Bush, MD or one of the following Advanced Practice Providers on your designated Care Team:    Murray Hodgkins, NP  Christell Faith, PA-C  Marrianne Mood, PA-C  Cadence Kathlen Mody, [place]  Laurann Montana, NP  Other Instructions  Heart Healthy Diet Recommendations: A low-salt diet is recommended. Meats should be grilled, baked, or boiled. Avoid fried foods. Focus on lean protein sources like fish or chicken with vegetables and fruits. The American Heart Association is a Microbiologist!  American Heart Association Diet and Lifeystyle Recommendations   Exercise recommendations: The American Heart Association recommends 150 minutes of moderate intensity exercise weekly. Try  30 minutes of moderate intensity exercise 4-5 times per week. This could include walking, jogging, or swimming.

## 2020-06-14 NOTE — Progress Notes (Signed)
Office Visit    Patient Name: Marcus Roberts Date of Encounter: 06/14/2020  PCP:  McLean-Scocuzza, Nino Glow, MD   [place]  Cardiologist:  Nelva Bush, MD  Advanced Practice Provider:  No care team member to display Electrophysiologist:  None   Chief Complaint    Marcus Roberts is a 45 y.o. male with a hx of CAD s/p PCI to prox/mid LAD 02/2019 residual disease in LCx and RCA, GERD, obesity ss/p gastric sleep, nephrolithiasis, anxiety/depression presents today for follow up after up-titration of Ranexa.   Past Medical History    Past Medical History:  Diagnosis Date  . Anxiety   . Aortic atherosclerosis ([place])   . CAD (coronary artery disease)    a. 12/2018 ETT: Ex time 9:39. 54mm horiz ST dep in V4-V6 with abnormal coronary CTA. b. Cath 02/2019 with multivessel CAD s/p orbital atherectomy/DES to prox-mid LAD, EF normal.  . Chest pain   . Depression   . GERD (gastroesophageal reflux disease)   . History of echocardiogram    a. 12/2018 Echo: EF 60-65%, no rwma. Mildly dil LA.  Marland Kitchen Hyperlipidemia   . Kidney stones    Past Surgical History:  Procedure Laterality Date  . CHOLECYSTECTOMY    . CORONARY ATHERECTOMY N/A 02/13/2019   Procedure: CORONARY ATHERECTOMY;  Surgeon: Nelva Bush, MD;  Location: [place] [facility];  Service: Cardiovascular;  Laterality: N/A;  . CORONARY STENT INTERVENTION N/A 02/13/2019   Procedure: CORONARY STENT INTERVENTION;  Surgeon: Nelva Bush, MD;  Location: [place] [facility];  Service: Cardiovascular;  Laterality: N/A;  . INTRAVASCULAR PRESSURE WIRE/FFR STUDY N/A 02/13/2019   Procedure: INTRAVASCULAR PRESSURE WIRE/FFR STUDY;  Surgeon: Nelva Bush, MD;  Location: [place] [facility];  Service: Cardiovascular;  Laterality: N/A;  . INTRAVASCULAR ULTRASOUND/IVUS N/A 02/13/2019   Procedure: Intravascular Ultrasound/IVUS;  Surgeon: Nelva Bush, MD;  Location: [place] [facility];  Service: Cardiovascular;  Laterality: N/A;   . LEFT HEART CATH AND CORONARY ANGIOGRAPHY N/A 02/13/2019   Procedure: LEFT HEART CATH AND CORONARY ANGIOGRAPHY;  Surgeon: Nelva Bush, MD;  Location: [place] [facility];  Service: Cardiovascular;  Laterality: N/A;  . vertical sleeve gastrectomy     2013 85% stomach removed was 280 lbs before surgery     Allergies  Allergies  Allergen Reactions  . Imdur [Isosorbide Nitrate]     Did not tolerate due to headache    History of Present Illness    Marcus Roberts is a 45 y.o. male with a hx of  CAD s/p PCI to prox/mid LAD 02/2019 residual disease in LCx and RCA, GERD, obesity ss/p gastric sleep, nephrolithiasis, anxiety/depression  last seen 06/01/20  Evaluated in November 2020 for chest tightness. Underwent exercise treadmill testing November notable for good exercise tolerance (walked 9:39) however developed 45mm horizontal ST segment depression in leads V4 through V6. Subsequent cardiac CT 02/02/19 with calcium score 923, placing him in the 99th percentile for age/sex. Noted >70% prox and mid stenosis in LAD and distal RCA. Imdur added as outpatient and atorvastatin up titrated. Echo 12/2018 with LVEF 60-65%, normal RV, mildly dilated LA. He was recommended for cardiac catheterization.    Admitted 02/23/19 for cardiac catheterization. Cardiac cath with 3-vessel CAD (60% prox, 90% mid, 40% distal LAD - CTO of mid LCx with L to L collaterals, 40 and 60% mid/distal RCA stenosis borderine hemodynamically significant by FFFR, CTO RCA with left to right collaterals). Underwent atherectomy and PCI to prox and  mid LAD with Synergy 3.0 x 32mm DES. Recommended for DAPT Brilinta and aspirin for at least 6 months, ideally longer. Imdur was discontinued due to headache. Brilinta was transitioned to Plavix due to shortness of breath.   He was seen 09/2019 noting increasing dyspnea though this had improved at follow up 12/09/19 and no further imaging or testing was recommended.   Seen 06/02/2018.  Concerns.  Noted  episode of lightheadedness while sitting at work at and feeling "disoriented ".  Also noticed increased shortness of breath at rest and with activity over the previous week.  Also endorsed feeling "aches "in his chest which were not scattered spots across his chest wall dating back to January.  He was concerned his rosuvastatin was causing brain fog as he was back in school for his bachelor's degree and having difficulty concentrating.  Lab work including CMP, CBC, TSH, CRP, sed rate was unremarkable.  His Ranexa was increased 2000 mg twice daily.  His Crestor was reduced to 20 mg daily plan to repeat lipid panel in 3 months to ensure LDL remains less than goal of 70.  He presents today for follow-up.  Reports marked improvement in dizziness, chest discomfort, breathing since increased dose of Ranexa.  He thinks his focus might have improved since reduced dose of Crestor but is not quite certain.  He continues to work from home and and take classes online to obtain his [place]. Notes isolated episode of chest discomfort that is described as sharp pain that was scattered across to his chest.  Tells me it made him sit back and "take attention "but self resolved without nitroglycerin.  We discussed that this is atypical for angina and that typical anginal symptoms occur with activity.  He has been walking outdoors for exercise and doing little bit of jogging without chest pain at the time.  EKGs/Labs/Other Studies Reviewed:   The following studies were reviewed today:  Cardiac cath 02/13/19 Conclusions: 1. Significant three-vessel coronary artery disease, including sequential 60% proximal, 90% mid, and 40% distal LAD stenoses, chronic total occlusion of mid LCx with left to left collaterals, sequential 40 and 60% mid/distal RCA stenoses that are borderline hemodynamically significant by FFR, and chronic total occlusion of RCA continuation with left-to-right collaterals. 2. Normal left ventricular systolic  function with mildly elevated filling pressure. 3. Successful IVUS guided orbital atherectomy and drug-eluting stent placement to the proximal and mid LAD using Synergy 3.0 x 38 mm drug-eluting stent with 0% residual stenosis and TIMI-3 flow.   Recommendations: 1. Overnight extended recovery. 2. Dual antiplatelet therapy with aspirin and ticagrelor for at least 6 months, ideally longer. 3. Aggressive secondary prevention, including high intensity statin therapy.   Echo 12/26/18  1. Left ventricular ejection fraction, by visual estimation, is 60 to 65%. The left ventricle has normal function. There is no left ventricular hypertrophy.  2. Global right ventricle has normal systolic function.The right ventricular size is normal. No increase in right ventricular wall thickness.  3. Left atrial size was mildly dilated.  4. TR signal is inadequate for assessing pulmonary artery systolic pressure.    EKG: No EKG EKG is ordered today.  The ekg independently reviewed from 06/01/2020 demonstrated  NSR 65 bpm with no acute St/T wave changes.  Recent Labs: 06/01/2020: ALT 35; BUN 9; Creatinine, Ser 1.03; Hemoglobin 15.2; Platelets 212; Potassium 4.7; Sodium 141; TSH 2.470  Recent Lipid Panel    Component Value Date/Time   CHOL 109 01/26/2020 0803   TRIG 111.0 01/26/2020  0803   HDL 43.40 01/26/2020 0803   CHOLHDL 3 01/26/2020 0803   VLDL 22.2 01/26/2020 0803   LDLCALC 44 01/26/2020 0803     Home Medications   Current Meds  Medication Sig  . amLODipine (NORVASC) 5 MG tablet TAKE 1 TABLET BY MOUTH EVERY DAY  . aspirin EC 81 MG tablet Take 81 mg by mouth daily.   . clopidogrel (PLAVIX) 75 MG tablet TAKE 1 TABLET (75 MG TOTAL) BY MOUTH DAILY. STOP BRILINTA.  Marland Kitchen cyclobenzaprine (FLEXERIL) 10 MG tablet TAKE 1 TABLET BY MOUTH AT BEDTIME AS NEEDED FOR MUSCLE SPASMS  . DENTA 5000 PLUS 1.1 % CREA dental cream See admin instructions.  . fluticasone (FLONASE) 50 MCG/ACT nasal spray Place 1 spray into both  nostrils every evening.   Marland Kitchen LORazepam (ATIVAN) 1 MG tablet Take 1 tablet (1 mg total) by mouth daily as needed for anxiety.  . Multiple Vitamin (MULTIVITAMIN WITH MINERALS) TABS tablet Take 1 tablet by mouth daily.  . nitroGLYCERIN (NITROSTAT) 0.4 MG SL tablet PLACE 1 TABLET (0.4 MG TOTAL) UNDER THE TONGUE EVERY 5 (FIVE) MINUTES AS NEEDED FOR CHEST PAIN.  Marland Kitchen pantoprazole (PROTONIX) 40 MG tablet TAKE 1 TABLET (40 MG TOTAL) BY MOUTH DAILY. [place] OR DINNER *STOP NEXIUM  . ranolazine (RANEXA) 1000 MG SR tablet Take 1 tablet (1,000 mg total) by mouth 2 (two) times daily.  . rosuvastatin (CRESTOR) 40 MG tablet Take 0.5 tablets (20 mg total) by mouth daily.  . tadalafil (CIALIS) 20 MG tablet 1 tab 1 hour prior to intercourse.  Do not take nitroglycerin within 36 hours of taking tadalafil     Review of Systems   All other systems reviewed and are otherwise negative except as noted above.  Physical Exam    VS:  BP 112/70 (BP Location: Left Arm, Patient Position: Sitting, Cuff Size: Normal)   Pulse 68   Ht 5\' 7"  (1.702 m)   Wt 210 lb (95.3 kg)   SpO2 98%   BMI 32.89 kg/m  , BMI Body mass index is 32.89 kg/m.  Wt Readings from Last 3 Encounters:  06/14/20 210 lb (95.3 kg)  06/01/20 208 lb (94.3 kg)  02/12/20 200 lb (90.7 kg)    GEN: Well nourished, well developed, in no acute distress. HEENT: normal. Neck: Supple, no JVD, carotid bruits, or masses. Cardiac: RRR, no murmurs, rubs, or gallops. No clubbing, cyanosis, edema.  Radials/PT 2+ and equal bilaterally.  Respiratory:  Respirations regular and unlabored, clear to auscultation bilaterally. GI: Soft, nontender, nondistended. MS: No deformity or atrophy. Skin: Warm and dry, no rash. Neuro:  Strength and sensation are intact. Psych: Normal affect.  Assessment & Plan    1. CAD / Dyspnea / Lightheadedness - Previous dyspnea improved since up titration of Ranexa.  No exertional chest discomfort concerning for angina.   Reports lightheadedness has resolved.  Additional GDMT includes aspirin, amlodipine, crestor PRN nitroglycerin.  Recent CBC, CMP, TSH, CRP, sed rate were unremarkable.  No indication for further ischemic evaluation at this time the symptoms are improving.   2. Anxiety - Likely contributory to symptoms. Continue to follow with PCP. Reassurane provided.   3. HTN - BP well controlled. Continue current antihypertensive regimen.   4. HLD, LDl goal <70 - Intolerant to Atorvastatin.  At last visit he wished to trial lower dose of Crestor 20 mg daily.  Continue Crestor 20 mg daily and plan to repeat lipid panel at next follow-up to ensure LDL remains <70.  Disposition: Follow up in 4 month(s) with Dr. Saunders Revel or APP   Signed, Loel Dubonnet, NP 06/14/2020, 9:38 AM [place]

## 2020-06-22 ENCOUNTER — Ambulatory Visit: Payer: 59 | Admitting: Internal Medicine

## 2020-06-29 ENCOUNTER — Ambulatory Visit: Payer: 59 | Admitting: Urology

## 2020-06-29 ENCOUNTER — Encounter: Payer: Self-pay | Admitting: Urology

## 2020-06-29 ENCOUNTER — Other Ambulatory Visit: Payer: Self-pay

## 2020-06-29 VITALS — BP 130/80 | HR 74 | Ht 68.0 in | Wt 210.0 lb

## 2020-06-29 DIAGNOSIS — R399 Unspecified symptoms and signs involving the genitourinary system: Secondary | ICD-10-CM | POA: Diagnosis not present

## 2020-06-29 LAB — BLADDER SCAN AMB NON-IMAGING: Scan Result: 39

## 2020-06-29 MED ORDER — TADALAFIL 20 MG PO TABS: ORAL_TABLET | ORAL | 5 refills | Status: DC

## 2020-06-29 NOTE — Progress Notes (Signed)
06/29/2020 3:16 PM   Marcus Roberts [birth year] [id number]  Referring provider: McLean-Scocuzza, Nino Glow, MD [place],  [place] [address]  Chief Complaint  Patient presents with  . Other    HPI: 45 y.o. male presents for follow-up of lower urinary tract symptoms and erectile dysfunction.   Since last visit he states his voiding symptoms have significantly improved and are currently not bothersome and he is off alpha blockade  Denies dysuria, gross hematuria  No flank, abdominal or pelvic pain  At last years visit sildenafil was felt to be less effective and he requested a trial of tadalafil.  Currently on 20 mg prn dose and states this is effective   PMH: Past Medical History:  Diagnosis Date  . Anxiety   . Aortic atherosclerosis ([place])   . CAD (coronary artery disease)    a. 12/2018 ETT: Ex time 9:39. 47mm horiz ST dep in V4-V6 with abnormal coronary CTA. b. Cath 02/2019 with multivessel CAD s/p orbital atherectomy/DES to prox-mid LAD, EF normal.  . Chest pain   . Depression   . GERD (gastroesophageal reflux disease)   . History of echocardiogram    a. 12/2018 Echo: EF 60-65%, no rwma. Mildly dil LA.  Marland Kitchen Hyperlipidemia   . Kidney stones     Surgical History: Past Surgical History:  Procedure Laterality Date  . CHOLECYSTECTOMY    . CORONARY ATHERECTOMY N/A 02/13/2019   Procedure: CORONARY ATHERECTOMY;  Surgeon: Nelva Bush, MD;  Location: [facility];  Service: Cardiovascular;  Laterality: N/A;  . CORONARY STENT INTERVENTION N/A 02/13/2019   Procedure: CORONARY STENT INTERVENTION;  Surgeon: Nelva Bush, MD;  Location: [facility];  Service: Cardiovascular;  Laterality: N/A;  . INTRAVASCULAR PRESSURE WIRE/FFR STUDY N/A 02/13/2019   Procedure: INTRAVASCULAR PRESSURE WIRE/FFR STUDY;  Surgeon: Nelva Bush, MD;  Location: [facility];  Service: Cardiovascular;  Laterality: N/A;  . INTRAVASCULAR ULTRASOUND/IVUS N/A 02/13/2019    Procedure: Intravascular Ultrasound/IVUS;  Surgeon: Nelva Bush, MD;  Location: [facility];  Service: Cardiovascular;  Laterality: N/A;  . LEFT HEART CATH AND CORONARY ANGIOGRAPHY N/A 02/13/2019   Procedure: LEFT HEART CATH AND CORONARY ANGIOGRAPHY;  Surgeon: Nelva Bush, MD;  Location: [facility];  Service: Cardiovascular;  Laterality: N/A;  . vertical sleeve gastrectomy     2013 85% stomach removed was 280 lbs before surgery     Home Medications:  Allergies as of 06/29/2020      Reactions   Imdur [isosorbide Nitrate]    Did not tolerate due to headache      Medication List       Accurate as of Jun 29, 2020  3:16 PM. If you have any questions, ask your nurse or doctor.        amLODipine 5 MG tablet Commonly known as: NORVASC TAKE 1 TABLET BY MOUTH EVERY DAY   aspirin EC 81 MG tablet Take 81 mg by mouth daily.   clopidogrel 75 MG tablet Commonly known as: PLAVIX TAKE 1 TABLET (75 MG TOTAL) BY MOUTH DAILY. STOP BRILINTA.   cyclobenzaprine 10 MG tablet Commonly known as: FLEXERIL TAKE 1 TABLET BY MOUTH AT BEDTIME AS NEEDED FOR MUSCLE SPASMS   Denta 5000 Plus 1.1 % Crea dental cream Generic drug: sodium fluoride See admin instructions.   fluticasone 50 MCG/ACT nasal spray Commonly known as: FLONASE Place 1 spray into both nostrils every evening.   furosemide 20 MG tablet Commonly known as: LASIX Take 1  tablet (20 mg total) by mouth daily as needed for edema.   LORazepam 1 MG tablet Commonly known as: ATIVAN Take 1 tablet (1 mg total) by mouth daily as needed for anxiety.   multivitamin with minerals Tabs tablet Take 1 tablet by mouth daily.   nitroGLYCERIN 0.4 MG SL tablet Commonly known as: NITROSTAT PLACE 1 TABLET (0.4 MG TOTAL) UNDER THE TONGUE EVERY 5 (FIVE) MINUTES AS NEEDED FOR CHEST PAIN.   pantoprazole 40 MG tablet Commonly known as: PROTONIX TAKE 1 TABLET (40 MG TOTAL) BY MOUTH DAILY. 30 MIN BEFORE BREAKFAST OR DINNER *STOP  NEXIUM   ranolazine 1000 MG SR tablet Commonly known as: RANEXA Take 1 tablet (1,000 mg total) by mouth 2 (two) times daily.   rosuvastatin 40 MG tablet Commonly known as: CRESTOR Take 0.5 tablets (20 mg total) by mouth daily.   tadalafil 20 MG tablet Commonly known as: CIALIS 1 tab 1 hour prior to intercourse.  Do not take nitroglycerin within 36 hours of taking tadalafil       Allergies:  Allergies  Allergen Reactions  . Imdur [Isosorbide Nitrate]     Did not tolerate due to headache    Family History: Family History  Problem Relation Age of Onset  . Diabetes Father   . Heart disease Father        x2  . Hyperlipidemia Father   . Hypertension Father   . Heart attack Father 86       x2; CABG x 3 11/2019  . Heart disease Brother        MI in 37s  . Heart attack Brother 51    Social History:  reports that he has never smoked. He has never used smokeless tobacco. He reports current alcohol use. He reports that he does not use drugs.   Physical Exam: BP 130/80   Pulse 74   Ht 5\' 8"  (1.727 m)   Wt 210 lb (95.3 kg)   BMI 31.93 kg/m   Constitutional:  Alert and oriented, No acute distress. HEENT: [place] AT, moist mucus membranes.  Trachea midline, no masses. Cardiovascular: No clubbing, cyanosis, or edema. Respiratory: Normal respiratory effort, no increased work of breathing. Psychiatric: Normal mood and affect.   Assessment & Plan:    1. Lower urinary tract symptoms  Mild voiding symptoms which are not bothersome  Bladder scan PVR 39 mL  2.  Erectile dysfunction  Tadalafil effective at 20 mg dose 1 hour prior to intercourse  Refill sent to pharmacy   Follow-up 1 year   Abbie Sons, MD  [facility] [address] [place], [place] [address] [phone]

## 2020-06-30 ENCOUNTER — Encounter: Payer: Self-pay | Admitting: Urology

## 2020-07-24 ENCOUNTER — Other Ambulatory Visit: Payer: Self-pay | Admitting: Internal Medicine

## 2020-07-24 DIAGNOSIS — M62838 Other muscle spasm: Secondary | ICD-10-CM

## 2020-08-21 ENCOUNTER — Other Ambulatory Visit: Payer: Self-pay | Admitting: Internal Medicine

## 2020-08-21 DIAGNOSIS — F419 Anxiety disorder, unspecified: Secondary | ICD-10-CM

## 2020-08-22 NOTE — Telephone Encounter (Signed)
RX Refill:ativan Last Seen:unk Last ordered:12-24-19

## 2020-08-26 ENCOUNTER — Other Ambulatory Visit: Payer: Self-pay | Admitting: Internal Medicine

## 2020-09-20 ENCOUNTER — Encounter: Payer: Self-pay | Admitting: Internal Medicine

## 2020-10-21 ENCOUNTER — Other Ambulatory Visit: Payer: Self-pay | Admitting: Internal Medicine

## 2020-10-21 NOTE — Telephone Encounter (Signed)
Patient called and said he will run out of his medication before his appointment on 9/26 and needs his medication. See note below.

## 2020-10-21 NOTE — Telephone Encounter (Signed)
Called to offer [name] an earlier appointment for Marcus Roberts on Tuesday 10/25/20 at 12pm. Left a message to call back.

## 2020-10-24 ENCOUNTER — Other Ambulatory Visit: Payer: Self-pay | Admitting: *Deleted

## 2020-10-24 ENCOUNTER — Encounter: Payer: Self-pay | Admitting: Urology

## 2020-10-24 MED ORDER — TADALAFIL 20 MG PO TABS: ORAL_TABLET | ORAL | 5 refills | Status: DC

## 2020-10-24 NOTE — Telephone Encounter (Signed)
Pt has an appointment scheduled for 10/25/20 at 12pm.

## 2020-10-25 ENCOUNTER — Ambulatory Visit: Payer: 59 | Admitting: Family

## 2020-10-25 ENCOUNTER — Other Ambulatory Visit: Payer: Self-pay

## 2020-10-25 ENCOUNTER — Encounter: Payer: Self-pay | Admitting: Family

## 2020-10-25 VITALS — BP 126/80 | HR 81 | Temp 97.1°F | Ht 67.99 in | Wt 195.2 lb

## 2020-10-25 DIAGNOSIS — Z23 Encounter for immunization: Secondary | ICD-10-CM | POA: Diagnosis not present

## 2020-10-25 DIAGNOSIS — F32A Depression, unspecified: Secondary | ICD-10-CM

## 2020-10-25 DIAGNOSIS — F419 Anxiety disorder, unspecified: Secondary | ICD-10-CM

## 2020-10-25 MED ORDER — BUPROPION HCL ER (XL) 150 MG PO TB24: 150.0000 mg | ORAL_TABLET | Freq: Every morning | ORAL | 2 refills | Status: DC

## 2020-10-25 MED ORDER — FLUOXETINE HCL 10 MG PO TABS: 10.0000 mg | ORAL_TABLET | ORAL | 2 refills | Status: DC

## 2020-10-25 NOTE — Patient Instructions (Signed)
We will change you from Wellbutrin 75 mg twice a day to Wellbutrin 150 mg taken once in the morning.  This is an extended release tablet.  You may also take low-dose Prozac 10 mg in the morning as well.  Please let me know how you are doing Our hope is for gradual improvement of mood since starting medication; however this may take several weeks.   If you start to have unusual thoughts, thoughts of hurting yourself, or anyone else, please go immediately to the emergency department.   Follow up in 6 weeks.   Please text to [phone] and write the word 'home'. This will put you in touch with trained crisis counselor and resources.    National Suicide Prevention Hotline - available 24 hours a day, 7 days a week.  ([phone]  Major Depressive Disorder Major depressive disorder is a mental illness. It also may be called clinical depression or unipolar depression. Major depressive disorder usually causes feelings of sadness, hopelessness, or helplessness. Some people with this disorder do not feel particularly sad but lose interest in doing things they used to enjoy (anhedonia). Major depressive disorder also can cause physical symptoms. It can interfere with work, school, relationships, and other normal everyday activities. The disorder varies in severity but is longer lasting and more serious than the sadness we all feel from time to time in our lives. Major depressive disorder often is triggered by stressful life events or major life changes. Examples of these triggers include divorce, loss of your job or home, a move, and the death of a family member or close friend. Sometimes this disorder occurs for no obvious reason at all. People who have family members with major depressive disorder or bipolar disorder are at higher risk for developing this disorder, with or without life stressors. Major depressive disorder can occur at any age. It may occur just once in your life (single episode major depressive  disorder). It may occur multiple times (recurrent major depressive disorder). SYMPTOMS People with major depressive disorder have either anhedonia or depressed mood on nearly a daily basis for at least 2 weeks or longer. Symptoms of depressed mood include: Feelings of sadness (blue or down in the dumps) or emptiness. Feelings of hopelessness or helplessness. Tearfulness or episodes of crying (may be observed by others). Irritability (children and adolescents). In addition to depressed mood or anhedonia or both, people with this disorder have at least four of the following symptoms: Difficulty sleeping or sleeping too much.   Significant change (increase or decrease) in appetite or weight.   Lack of energy or motivation. Feelings of guilt and worthlessness.   Difficulty concentrating, remembering, or making decisions. Unusually slow movement (psychomotor retardation) or restlessness (as observed by others).   Recurrent wishes for death, recurrent thoughts of self-harm (suicide), or a suicide attempt. People with major depressive disorder commonly have persistent negative thoughts about themselves, other people, and the world. People with severe major depressive disorder may experience distorted beliefs or perceptions about the world (psychotic delusions). They also may see or hear things that are not real (psychotic hallucinations). DIAGNOSIS Major depressive disorder is diagnosed through an assessment by your health care provider. Your health care provider will ask about aspects of your daily life, such as mood, sleep, and appetite, to see if you have the diagnostic symptoms of major depressive disorder. Your health care provider may ask about your medical history and use of alcohol or drugs, including prescription medicines. Your health care provider also  may do a physical exam and blood work. This is because certain medical conditions and the use of certain substances can cause major depressive  disorder-like symptoms (secondary depression). Your health care provider also may refer you to a mental health specialist for further evaluation and treatment. TREATMENT It is important to recognize the symptoms of major depressive disorder and seek treatment. The following treatments can be prescribed for this disorder:   Medicine. Antidepressant medicines usually are prescribed. Antidepressant medicines are thought to correct chemical imbalances in the brain that are commonly associated with major depressive disorder. Other types of medicine may be added if the symptoms do not respond to antidepressant medicines alone or if psychotic delusions or hallucinations occur. Talk therapy. Talk therapy can be helpful in treating major depressive disorder by providing support, education, and guidance. Certain types of talk therapy also can help with negative thinking (cognitive behavioral therapy) and with relationship issues that trigger this disorder (interpersonal therapy). A mental health specialist can help determine which treatment is best for you. Most people with major depressive disorder do well with a combination of medicine and talk therapy. Treatments involving electrical stimulation of the brain can be used in situations with extremely severe symptoms or when medicine and talk therapy do not work over time. These treatments include electroconvulsive therapy, transcranial magnetic stimulation, and vagal nerve stimulation.   This information is not intended to replace advice given to you by your health care provider. Make sure you discuss any questions you have with your health care provider.   Document Released: 05/19/2012 Document Revised: 02/12/2014 Document Reviewed: 05/19/2012 Elsevier Interactive Patient Education [employer].

## 2020-10-25 NOTE — Progress Notes (Signed)
Subjective:    Patient ID: DEVONE TOUSLEY, male    DOB: 12-29-1975, 45 y.o.   MRN: [id number]  CC: TAHEEM FRICKE is a 45 y.o. male who presents today for follow up.   HPI: Here today for discussion of depression, anxiety and for refill of Wellbutrin 75mg  BID.  Intense crying. Grieving. More depression than anxiety.  Separation from wife July of this year.  20 year old girl.  He works in [employer] traffic [employer], and starting school online in the evenings.  Living in apartment. Sees daughter regularly.   Using 0.5mg  tablet ativan daily Had doc demand appointment and started wellbutrin.  Less irritable on wellbutrin.  He has been on Wellbutrin in the past.  He is hoping to come off all medication future. Has connected with therapist and has had 2 sessions  No h/o seizure, anorexia, bulimia.   Drinks 2-3 glasses of wine per week.   Sleeping okay.   No thoughts of hurting himself or anyone else.  He denies any suicide plan.  He does have guns at home and reports that they are stored in a safe.       Follow-up depression, anxiety.  He is recently separated from his wife which is caused stress depression anxiety.  Trouble focusing this is affecting his job.  He minimally treated in the past and he would like to resume this. He is a non-smoker.  He drinks alcohol 2 times per year.  He follows with cardiology for coronary artery disease.  He is compliant with Plavix 75 mg, ASA 81 mg.  No bleeding. Cardiac catheter 02/2019.  Last seen by cardiology 06/2020.  HISTORY:  Past Medical History:  Diagnosis Date   Anxiety    Aortic atherosclerosis ([facility])    CAD (coronary artery disease)    a. 12/2018 ETT: Ex time 9:39. 8mm horiz ST dep in V4-V6 with abnormal coronary CTA. b. Cath 02/2019 with multivessel CAD s/p orbital atherectomy/DES to prox-mid LAD, EF normal.   Chest pain    Depression    GERD (gastroesophageal reflux disease)    History of echocardiogram    a. 12/2018 Echo: EF 60-65%, no rwma.  Mildly dil LA.   Hyperlipidemia    Kidney stones    Past Surgical History:  Procedure Laterality Date   CHOLECYSTECTOMY     CORONARY ATHERECTOMY N/A 02/13/2019   Procedure: CORONARY ATHERECTOMY;  Surgeon: Nelva Bush, MD;  Location: [facility];  Service: Cardiovascular;  Laterality: N/A;   CORONARY STENT INTERVENTION N/A 02/13/2019   Procedure: CORONARY STENT INTERVENTION;  Surgeon: Nelva Bush, MD;  Location: [facility];  Service: Cardiovascular;  Laterality: N/A;   INTRAVASCULAR PRESSURE WIRE/FFR STUDY N/A 02/13/2019   Procedure: INTRAVASCULAR PRESSURE WIRE/FFR STUDY;  Surgeon: Nelva Bush, MD;  Location: [facility];  Service: Cardiovascular;  Laterality: N/A;   INTRAVASCULAR ULTRASOUND/IVUS N/A 02/13/2019   Procedure: Intravascular Ultrasound/IVUS;  Surgeon: Nelva Bush, MD;  Location: [facility];  Service: Cardiovascular;  Laterality: N/A;   LEFT HEART CATH AND CORONARY ANGIOGRAPHY N/A 02/13/2019   Procedure: LEFT HEART CATH AND CORONARY ANGIOGRAPHY;  Surgeon: Nelva Bush, MD;  Location: [facility];  Service: Cardiovascular;  Laterality: N/A;   vertical sleeve gastrectomy     2013 85% stomach removed was 280 lbs before surgery    Family History  Problem Relation Age of Onset   Diabetes Father    Heart disease Father        x2  Hyperlipidemia Father    Hypertension Father    Heart attack Father 9       x2; CABG x 3 11/2019   Heart disease Brother        MI in 63s   Heart attack Brother 40    Allergies: Imdur [isosorbide nitrate] Current Outpatient Medications on File Prior to Visit  Medication Sig Dispense Refill   amLODipine (NORVASC) 5 MG tablet TAKE 1 TABLET BY MOUTH EVERY DAY 90 tablet 3   aspirin EC 81 MG tablet Take 81 mg by mouth daily.      clopidogrel (PLAVIX) 75 MG tablet Take 1 tablet (75 mg total) by mouth daily. PLEASE CALL OFFICE TO SCHEDULE APPOINTMENT FOR FURTHER REFILLS. 90 tablet 0   cyclobenzaprine  (FLEXERIL) 10 MG tablet TAKE 1 TABLET BY MOUTH AT BEDTIME AS NEEDED FOR MUSCLE SPASMS 90 tablet 1   DENTA 5000 PLUS 1.1 % CREA dental cream See admin instructions.     fluticasone (FLONASE) 50 MCG/ACT nasal spray Place 1 spray into both nostrils every evening.      LORazepam (ATIVAN) 1 MG tablet TAKE 1 TABLET BY MOUTH DAILY AS NEEDED FOR ANXIETY 30 tablet 5   Multiple Vitamin (MULTIVITAMIN WITH MINERALS) TABS tablet Take 1 tablet by mouth daily.     nitroGLYCERIN (NITROSTAT) 0.4 MG SL tablet PLACE 1 TABLET (0.4 MG TOTAL) UNDER THE TONGUE EVERY 5 (FIVE) MINUTES AS NEEDED FOR CHEST PAIN. 25 tablet 1   pantoprazole (PROTONIX) 40 MG tablet TAKE 1 TABLET (40 MG TOTAL) BY MOUTH DAILY. 30 MIN BEFORE BREAKFAST OR DINNER *STOP NEXIUM 90 tablet 3   ranolazine (RANEXA) 1000 MG SR tablet Take 1 tablet (1,000 mg total) by mouth 2 (two) times daily. 60 tablet 5   rosuvastatin (CRESTOR) 40 MG tablet TAKE 1 TABLET BY MOUTH EVERY DAY 45 tablet 1   tadalafil (CIALIS) 20 MG tablet 1 tab 1 hour prior to intercourse.  Do not take nitroglycerin within 36 hours of taking tadalafil 30 tablet 5   furosemide (LASIX) 20 MG tablet Take 1 tablet (20 mg total) by mouth daily as needed for edema. 90 tablet 3   [DISCONTINUED] Dexlansoprazole 30 MG capsule Take 1 capsule (30 mg total) by mouth daily. 90 capsule 3   No current facility-administered medications on file prior to visit.    Social History   Tobacco Use   Smoking status: Never   Smokeless tobacco: Never  Vaping Use   Vaping Use: Never used  Substance Use Topics   Alcohol use: Yes    Comment: occas. 2-3 x per year    Drug use: Never    Review of Systems  Constitutional:  Negative for chills and fever.  Respiratory:  Negative for cough.   Cardiovascular:  Negative for chest pain and palpitations.  Gastrointestinal:  Negative for nausea and vomiting.  Psychiatric/Behavioral:  Positive for decreased concentration. Negative for sleep disturbance and suicidal  ideas. The patient is nervous/anxious.      Objective:    BP 126/80   Pulse 81   Temp (!) 97.1 F (36.2 C)   Ht 5' 7.99" (1.727 m)   Wt 195 lb 3.2 oz (88.5 kg)   SpO2 97%   BMI 29.69 kg/m  BP Readings from Last 3 Encounters:  10/25/20 126/80  06/29/20 130/80  06/14/20 112/70   Wt Readings from Last 3 Encounters:  10/25/20 195 lb 3.2 oz (88.5 kg)  06/29/20 210 lb (95.3 kg)  06/14/20 210 lb (95.3 kg)  Depression screen [facility] 2/9 10/25/2020 03/12/2019 12/04/2017  Decreased Interest 3 2 2   Down, Depressed, Hopeless 3 2 1   PHQ - 2 Score 6 4 3   Altered sleeping 3 2 0  Tired, decreased energy 2 2 1   Change in appetite 3 2 0  Feeling bad or failure about yourself  3 3 1   Trouble concentrating 3 2 0  Moving slowly or fidgety/restless 2 1 0  Suicidal thoughts 0 0 0  PHQ-9 Score 22 16 5   Difficult doing work/chores Very difficult Somewhat difficult Not difficult at all    Physical Exam Vitals reviewed.  Constitutional:      Appearance: He is well-developed.  Cardiovascular:     Rate and Rhythm: Regular rhythm.     Heart sounds: Normal heart sounds.  Pulmonary:     Effort: Pulmonary effort is normal. No respiratory distress.     Breath sounds: Normal breath sounds. No wheezing, rhonchi or rales.  Skin:    General: Skin is warm and dry.  Neurological:     Mental Status: He is alert.  Psychiatric:        Speech: Speech normal.        Behavior: Behavior normal.       Assessment & Plan:   Problem List Items Addressed This Visit       Other   Anxiety and depression - Primary    Uncontrolled.  Patient does not seem more irritable and more anxious on Wellbutrin so I have opted to continue this medication as it has been helpful for him.  We opted to change to the XR formulation, Wellbutrin 150 mg.  Have sent in this medication.  We also agreed to adjunct with Prozac 10 mg as more activating.  He will continue counseling.  I also consulted with Laurann Montana via secure chat,  cardiology as it relates to increased risk of bleeding. I also consulted with clinical pharmacist Catie Darnelle Maffucci who not concerned as it relates to patient being on Plavix, aspirin and an SSRI.  We can recheck CBC at follow-up.      Relevant Medications   FLUoxetine (PROZAC) 10 MG tablet   buPROPion (WELLBUTRIN XL) 150 MG 24 hr tablet   Other Visit Diagnoses     Need for influenza vaccination       Relevant Orders   Flu Vaccine QUAD 28mo+IM (Fluarix, Fluzone & Alfiuria Quad PF)        I am having Eon L. Jakob start on FLUoxetine and buPROPion. I am also having him maintain his fluticasone, aspirin EC, multivitamin with minerals, furosemide, amLODipine, nitroGLYCERIN, pantoprazole, Denta 5000 Plus, ranolazine, cyclobenzaprine, LORazepam, rosuvastatin, clopidogrel, and tadalafil.   Meds ordered this encounter  Medications   FLUoxetine (PROZAC) 10 MG tablet    Sig: Take 1 tablet (10 mg total) by mouth every morning.    Dispense:  90 tablet    Refill:  2    Order Specific Question:   Supervising Provider    Answer:   Deborra Medina L [[id number]]   buPROPion (WELLBUTRIN XL) 150 MG 24 hr tablet    Sig: Take 1 tablet (150 mg total) by mouth in the morning.    Dispense:  90 tablet    Refill:  2    Order Specific Question:   Supervising Provider    Answer:   Crecencio Mc [[id number]]    Return precautions given.   Risks, benefits, and alternatives of the medications and treatment plan prescribed today were discussed, and  patient expressed understanding.   Education regarding symptom management and diagnosis given to patient on AVS.  Continue to follow with McLean-Scocuzza, Nino Glow, MD for routine health maintenance.   Yunus Carlean Jews Fehringer and I agreed with plan.   Mable Paris, FNP

## 2020-10-25 NOTE — Assessment & Plan Note (Signed)
Uncontrolled.  Patient does not seem more irritable and more anxious on Wellbutrin so I have opted to continue this medication as it has been helpful for him.  We opted to change to the XR formulation, Wellbutrin 150 mg.  Have sent in this medication.  We also agreed to adjunct with Prozac 10 mg as more activating.  He will continue counseling.  I also consulted with Laurann Montana via secure chat, cardiology as it relates to increased risk of bleeding. I also consulted with clinical pharmacist Catie Darnelle Maffucci who not concerned as it relates to patient being on Plavix, aspirin and an SSRI.  We can recheck CBC at follow-up.

## 2020-10-26 ENCOUNTER — Encounter: Payer: Self-pay | Admitting: Internal Medicine

## 2020-10-31 ENCOUNTER — Telehealth: Payer: Self-pay

## 2020-10-31 ENCOUNTER — Other Ambulatory Visit: Payer: Self-pay | Admitting: Family

## 2020-10-31 ENCOUNTER — Ambulatory Visit: Payer: 59 | Admitting: Family

## 2020-10-31 DIAGNOSIS — K649 Unspecified hemorrhoids: Secondary | ICD-10-CM

## 2020-10-31 NOTE — Telephone Encounter (Signed)
Prior authorization for Fluoxetine was submitted to CoverMyMeds. Was notified that the medicaiton was submitted for Fluoxetine HCL 10MG  Tablets and that a prior authorization was not needed.  KEY [id number]  Called to inform the patient, left a message to call back.

## 2020-11-01 ENCOUNTER — Encounter: Payer: Self-pay | Admitting: *Deleted

## 2020-11-01 NOTE — Telephone Encounter (Signed)
Left a message to call back.

## 2020-11-02 NOTE — Telephone Encounter (Signed)
Called to speak with Marcus Roberts. Phone was answered but no one spoke. Could not leave a verbal message to call back. Call was disconnected.

## 2020-11-03 ENCOUNTER — Other Ambulatory Visit: Payer: Self-pay

## 2020-11-04 ENCOUNTER — Encounter: Payer: Self-pay | Admitting: Gastroenterology

## 2020-11-04 ENCOUNTER — Other Ambulatory Visit: Payer: Self-pay

## 2020-11-04 ENCOUNTER — Ambulatory Visit: Payer: 59 | Admitting: Gastroenterology

## 2020-11-04 VITALS — BP 124/87 | HR 85 | Temp 98.5°F | Ht 67.0 in | Wt 193.4 lb

## 2020-11-04 DIAGNOSIS — K645 Perianal venous thrombosis: Secondary | ICD-10-CM

## 2020-11-04 NOTE — Progress Notes (Signed)
Cephas Darby, MD [address]  [place]  [place], [place] [address]  Main: [phone]  Fax: [phone]    Gastroenterology Consultation  Referring Provider:     McLean-Scocuzza, Olivia Mackie * Primary Care Physician:  McLean-Scocuzza, Nino Glow, MD Primary Gastroenterologist:  Dr. Cephas Darby Reason for Consultation:     Symptomatic hemorrhoids        HPI:   Marcus Roberts is a 45 y.o. male referred by Dr. Terese Door, Nino Glow, MD  for consultation & management of symptomatic hemorrhoid.  Patient has history of premature coronary disease s/p PCI on Plavix.  Patient reports that he has been suffering from hemorrhoidal symptoms including burning, pain for last several years and his symptoms are usually relieved with Tucks pads, which hazel cream.  He was also experiencing prolapse of the hemorrhoids that would spontaneously reduce.  However, about a month ago, he noticed that one of the hemorrhoid prolapsed and not able to be reduced and it was tender, pea-sized.  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  He denies any constipation, trying to eat healthy, lost some weight.  NSAIDs: None  Antiplts/Anticoagulants/Anti thrombotics: Plavix for history of coronary disease s/p PCI  GI Procedures: None He denies family history of GI malignancy  Past Medical History:  Diagnosis Date   Anxiety    Aortic atherosclerosis (HCC)    CAD (coronary artery disease)    a. 12/2018 ETT: Ex time 9:39. 68mm horiz ST dep in V4-V6 with abnormal coronary CTA. b. Cath 02/2019 with multivessel CAD s/p orbital atherectomy/DES to prox-mid LAD, EF normal.   Chest pain    Depression    GERD (gastroesophageal reflux disease)    History of echocardiogram    a. 12/2018 Echo: EF 60-65%, no rwma. Mildly dil LA.   Hyperlipidemia    Kidney stones     Past Surgical History:  Procedure Laterality Date   CHOLECYSTECTOMY     CORONARY ATHERECTOMY N/A 02/13/2019   Procedure: CORONARY  ATHERECTOMY;  Surgeon: Nelva Bush, MD;  Location: [facility];  Service: Cardiovascular;  Laterality: N/A;   CORONARY STENT INTERVENTION N/A 02/13/2019   Procedure: CORONARY STENT INTERVENTION;  Surgeon: Nelva Bush, MD;  Location: [facility];  Service: Cardiovascular;  Laterality: N/A;   INTRAVASCULAR PRESSURE WIRE/FFR STUDY N/A 02/13/2019   Procedure: INTRAVASCULAR PRESSURE WIRE/FFR STUDY;  Surgeon: Nelva Bush, MD;  Location: [facility];  Service: Cardiovascular;  Laterality: N/A;   INTRAVASCULAR ULTRASOUND/IVUS N/A 02/13/2019   Procedure: Intravascular Ultrasound/IVUS;  Surgeon: Nelva Bush, MD;  Location: [facility];  Service: Cardiovascular;  Laterality: N/A;   LEFT HEART CATH AND CORONARY ANGIOGRAPHY N/A 02/13/2019   Procedure: LEFT HEART CATH AND CORONARY ANGIOGRAPHY;  Surgeon: Nelva Bush, MD;  Location: [facility];  Service: Cardiovascular;  Laterality: N/A;   vertical sleeve gastrectomy     2013 85% stomach removed was 280 lbs before surgery    Current Outpatient Medications:    amLODipine (NORVASC) 5 MG tablet, TAKE 1 TABLET BY MOUTH EVERY DAY, Disp: 90 tablet, Rfl: 3   aspirin EC 81 MG tablet, Take 81 mg by mouth daily. , Disp: , Rfl:    buPROPion (WELLBUTRIN) 75 MG tablet, Take 75 mg by mouth 2 (two) times daily., Disp: , Rfl:    clopidogrel (PLAVIX) 75 MG tablet, Take 1 tablet (75 mg total) by mouth daily. PLEASE CALL OFFICE TO SCHEDULE  APPOINTMENT FOR FURTHER REFILLS., Disp: 90 tablet, Rfl: 0   cyclobenzaprine (FLEXERIL) 10 MG tablet, TAKE 1 TABLET BY MOUTH AT BEDTIME AS NEEDED FOR MUSCLE SPASMS, Disp: 90 tablet, Rfl: 1   DENTA 5000 PLUS 1.1 % CREA dental cream, See admin instructions., Disp: , Rfl:    FLUoxetine (PROZAC) 10 MG tablet, Take 1 tablet (10 mg total) by mouth every morning., Disp: 90 tablet, Rfl: 2   fluticasone (FLONASE) 50 MCG/ACT nasal spray, Place 1 spray into both nostrils every evening. , Disp: , Rfl:     LORazepam (ATIVAN) 1 MG tablet, TAKE 1 TABLET BY MOUTH DAILY AS NEEDED FOR ANXIETY, Disp: 30 tablet, Rfl: 5   Multiple Vitamin (MULTIVITAMIN WITH MINERALS) TABS tablet, Take 1 tablet by mouth daily., Disp: , Rfl:    nitroGLYCERIN (NITROSTAT) 0.4 MG SL tablet, PLACE 1 TABLET (0.4 MG TOTAL) UNDER THE TONGUE EVERY 5 (FIVE) MINUTES AS NEEDED FOR CHEST PAIN., Disp: 25 tablet, Rfl: 1   pantoprazole (PROTONIX) 40 MG tablet, TAKE 1 TABLET (40 MG TOTAL) BY MOUTH DAILY. 30 MIN BEFORE BREAKFAST OR DINNER *STOP NEXIUM, Disp: 90 tablet, Rfl: 3   ranolazine (RANEXA) 1000 MG SR tablet, Take 1 tablet (1,000 mg total) by mouth 2 (two) times daily., Disp: 60 tablet, Rfl: 5   rosuvastatin (CRESTOR) 40 MG tablet, TAKE 1 TABLET BY MOUTH EVERY DAY, Disp: 45 tablet, Rfl: 1   tadalafil (CIALIS) 20 MG tablet, 1 tab 1 hour prior to intercourse.  Do not take nitroglycerin within 36 hours of taking tadalafil, Disp: 30 tablet, Rfl: 5   furosemide (LASIX) 20 MG tablet, Take 1 tablet (20 mg total) by mouth daily as needed for edema., Disp: 90 tablet, Rfl: 3    Family History  Problem Relation Age of Onset   Diabetes Father    Heart disease Father        x2   Hyperlipidemia Father    Hypertension Father    Heart attack Father 38       x2; CABG x 3 11/2019   Heart disease Brother        MI in 69s   Heart attack Brother 18     Social History   Tobacco Use   Smoking status: Never   Smokeless tobacco: Never  Vaping Use   Vaping Use: Never used  Substance Use Topics   Alcohol use: Yes    Comment: occas. 2-3 x per year    Drug use: Never    Allergies as of 11/04/2020 - Review Complete 11/04/2020  Allergen Reaction Noted   Imdur [isosorbide nitrate]  02/14/2019    Review of Systems:    All systems reviewed and negative except where noted in HPI.   Physical Exam:  BP 124/87 (BP Location: Left Arm, Patient Position: Sitting, Cuff Size: Large)   Pulse 85   Temp 98.5 F (36.9 C) (Temporal)   Ht 5\' 7"  (1.702  m)   Wt 193 lb 6.4 oz (87.7 kg)   BMI 30.29 kg/m  No LMP for male patient.  General:   Alert,  Well-developed, well-nourished, pleasant and cooperative in NAD Head:  Normocephalic and atraumatic. Eyes:  Sclera clear, no icterus.   Conjunctiva pink. Ears:  Normal auditory acuity. Nose:  No deformity, discharge, or lesions. Mouth:  No deformity or lesions,oropharynx pink & moist. Neck:  Supple; no masses or thyromegaly. Lungs:  Respirations even and unlabored.  Clear throughout to auscultation.   No wheezes, crackles, or rhonchi. No acute distress. Heart:  Regular rate and rhythm; no murmurs, clicks, rubs, or gallops. Abdomen:  Normal bowel sounds. Soft, non-tender and non-distended without masses, hepatosplenomegaly or hernias noted.  No guarding or rebound tenderness.   Rectal: Thrombosed right posterior external hemorrhoid with an organized mobile clot, nontender.  Nontender digital rectal exam, there are no palpable lesions Msk:  Symmetrical without gross deformities. Good, equal movement & strength bilaterally. Pulses:  Normal pulses noted. Extremities:  No clubbing or edema.  No cyanosis. Neurologic:  Alert and oriented x3;  grossly normal neurologically. Skin:  Intact without significant lesions or rashes. No jaundice. Psych:  Alert and cooperative. Normal mood and affect.  Imaging Studies: No abdominal imaging  Assessment and Plan:   QUANG THORPE is a 45 y.o. pleasant Caucasian male with history of coronary disease s/p PCI on DAPT is seen in consultation for symptomatic thrombosed external hemorrhoid.  Patient now has an organized clot of the right posterior hemorrhoid.  This has been there for more than a month and patient is experiencing symptoms of discomfort, pain and burning.  I will refer him to general surgery to see if this can be extracted.  Patient will not benefit from hemorrhoid banding for thrombosed external hemorrhoid.  I have advised him that I will be happy to do  banding after consultation with general surgery.  Suggested him to do sitz bath's with Epsom salt   Follow up as needed   Cephas Darby, MD

## 2020-11-04 NOTE — Progress Notes (Signed)
Referrel sent to general surgery

## 2020-11-04 NOTE — Telephone Encounter (Signed)
Left a message to call back to discuss the Fluoxetine medication. 3rd attempt to contact patient. Sent a message through [facility] that their medication was approved.

## 2020-11-07 ENCOUNTER — Other Ambulatory Visit: Payer: Self-pay | Admitting: Family

## 2020-11-07 ENCOUNTER — Encounter: Payer: Self-pay | Admitting: Family

## 2020-11-07 ENCOUNTER — Other Ambulatory Visit: Payer: Self-pay | Admitting: Internal Medicine

## 2020-11-07 DIAGNOSIS — F419 Anxiety disorder, unspecified: Secondary | ICD-10-CM

## 2020-11-07 DIAGNOSIS — F32A Depression, unspecified: Secondary | ICD-10-CM

## 2020-11-07 MED ORDER — FLUOXETINE HCL 10 MG PO CAPS
10.0000 mg | ORAL_CAPSULE | Freq: Every day | ORAL | 3 refills | Status: DC
Start: 2020-11-07 — End: 2020-11-25

## 2020-11-07 NOTE — Telephone Encounter (Signed)
Attempted to schedule.  

## 2020-11-07 NOTE — Telephone Encounter (Signed)
Patient asking for a call back regarding refill

## 2020-11-07 NOTE — Telephone Encounter (Signed)
Patient calling back in and informed.  Patient verbalized understanding  

## 2020-11-07 NOTE — Telephone Encounter (Signed)
Please schedule overdue F/U appointment. Thank you! ?

## 2020-11-08 NOTE — Telephone Encounter (Signed)
Left a message to call back.

## 2020-11-15 ENCOUNTER — Ambulatory Visit: Payer: 59 | Admitting: Surgery

## 2020-11-23 ENCOUNTER — Telehealth: Payer: Self-pay | Admitting: Family

## 2020-11-23 DIAGNOSIS — F32A Depression, unspecified: Secondary | ICD-10-CM

## 2020-11-23 NOTE — Telephone Encounter (Signed)
LMTCB

## 2020-11-23 NOTE — Telephone Encounter (Signed)
Call pt We never heard back from him after mychart message  How is he doing?  I consulted again with pharmacy regarding SSRIs and clopidogrel and concerned with interaction upon further review.   I advise we change from prozac to another medication in same class.    If he is experiencing increased anxiety and prozac is rather activating, I would recommend either sertraline ( zoloft)  or escitalopram, as neither agent has CYP2C19 action w/ clopidogrel  I also talked about switching bupropion 75mg  BID to bupropion 150mg  QD. I didn't do that for some reason.   He would stop prozac 10mg  and start zolof the following day.  Is also okay with zoloft 50mg  qhs and change to bupropion 150mg  QD?  After you speak with him and he is aware of changes, I can send in.  Please ensure f/u with me or dr Aundra Dubin in 6 weeks, sooner if needed

## 2020-11-23 NOTE — Telephone Encounter (Signed)
-----   Message from De Hollingshead, [place] sent at 11/14/2020  8:38 AM EDT ----- Marykay Lex,   Yes, fluoxetine can decrease the conversion of clopidogrel to it's active metabolite. Given the severity of his CAD and lack of tolerance of Brilinta, we need to make sure the clopidogrel is working as best it can.   I'd recommend either sertraline or escitalopram, neither agent has CYP2C19 action w/ clopidogrel.   I see where he endorsed increased anxiety with starting fluoxetine in that MyChart message. I agree, that can happen in the beginning with it being more activating - with any SSRI. Should level out over the next several weeks.   I see where he asked his cardiologist about bleed risk w/ SSRI and clopidogrel and aspirin. I am less concerned about the bleed risk of being on SSRI, it's not as clinically relevant.   It looks like you had mentioned switching from bupropion 75 mg BID to XL 150 mg QAM, but it doesn't look like it got sent. I always agree with that to avoid that peak of the second dose causing insomnia!  Catie ----- Message ----- From: Burnard Hawthorne, FNP Sent: 11/13/2020   8:47 PM EDT To: De Hollingshead, RPH-CPP  Hi there,   I received a drug alert regarding one of Dr Claris Gladden patient regarding prozac and plavix rendering plavix less effective.   I see the same on [facility].   What do you advise here?   Is there another SSRI that I lean too?

## 2020-11-24 NOTE — Telephone Encounter (Signed)
Placed call to pt. Pt states he has been havig increased anxiety with the prozac. Informed pt of provider recommendation. Pt agreed to changing medications. Pt was informed on how medicine should be taken and which ones to stop once provider sends medication into pharmacy.

## 2020-11-25 MED ORDER — BUPROPION HCL ER (XL) 150 MG PO TB24: 150.0000 mg | ORAL_TABLET | Freq: Every day | ORAL | 3 refills | Status: DC

## 2020-11-25 MED ORDER — SERTRALINE HCL 50 MG PO TABS: 50.0000 mg | ORAL_TABLET | Freq: Every day | ORAL | 3 refills | Status: DC

## 2020-11-25 NOTE — Telephone Encounter (Signed)
Call pt  I have sent in zoloft 50mg  , wellbutrin 150mg  XL.   Wellbutrin can be quite stimulating and contributing to anxiety as well.  If anxiety feels improved, please advise him to call the office and so we may wean him off of Wellbutrin.

## 2020-11-29 NOTE — Telephone Encounter (Signed)
Pt notified on previous phone call.

## 2020-12-15 ENCOUNTER — Encounter: Payer: Self-pay | Admitting: Internal Medicine

## 2020-12-15 NOTE — Telephone Encounter (Signed)
Please advise, could this be a side effect of medication?   Should Patient stop medication and be scheduled to be seen?

## 2021-01-09 ENCOUNTER — Other Ambulatory Visit: Payer: Self-pay | Admitting: Internal Medicine

## 2021-01-11 ENCOUNTER — Telehealth: Payer: Self-pay | Admitting: Internal Medicine

## 2021-01-11 NOTE — Telephone Encounter (Signed)
McLean-Scocuzza, Nino Glow, MD  Thressa Sheller, CMA      Previous Messages   ----- Message -----  From: McLean-Scocuzza, Nino Glow, MD  Sent: 12/19/2020  To: [name]  Subject: Unread Message Notification                     It could be a side effect 1-2% and crestor for cholesterol can have side effects on your muscles  If this is the newest I would say maybe those two  You cant abruptly stop and would have to taper off take 1/2 zoloft daily for 1 week then 1/2 tablet every  other day then stop and the same with wellbutrin   If this continues after stopped I would consider neurology referral if this is requested outside of an appointment this will be a my chart consult fee if neurology referral desired for further testing on your muscles I.e nerve conduction study    ----- Message -----       From:Marcus Roberts       Sent:12/15/2020  1:00 PM EST         [name], MD    Subject:Side effect zoloft/wellbutrin? - muscle twitching   Hello I have noticed increased muscle jerking and twitching in all different muscles everywhere on me. They can be strong and literally move an arm or a leg several inches. Is this a side effect that I should be concerned about? Thank you.

## 2021-01-12 NOTE — Telephone Encounter (Signed)
Left message to return call.  "It could be a side effect 1-2% and crestor for cholesterol can have side effects on your muscles  If this is the newest I would say maybe those two  You cant abruptly stop and would have to taper off take 1/2 zoloft daily for 1 week then 1/2 tablet every  other day then stop and the same with wellbutrin   If this continues after stopped I would consider neurology referral if this is requested outside of an appointment this will be a my chart consult fee if neurology referral desired for further testing on your muscles I.e nerve conduction study " -Orland Mustard, MD.

## 2021-01-17 ENCOUNTER — Ambulatory Visit: Payer: 59 | Admitting: Internal Medicine

## 2021-01-24 ENCOUNTER — Other Ambulatory Visit: Payer: Self-pay | Admitting: Internal Medicine

## 2021-01-24 NOTE — Telephone Encounter (Signed)
Please advise.   Per medication list: Crestor 40mg  - 0.5mg  (73mh) by mouth daily.   Looks like patient has been taking Crestor 40MG  .

## 2021-01-25 MED ORDER — ROSUVASTATIN CALCIUM 40 MG PO TABS: 40.0000 mg | ORAL_TABLET | Freq: Every day | ORAL | 3 refills | Status: DC

## 2021-01-31 NOTE — Progress Notes (Signed)
Follow-up Outpatient Visit Date: 02/01/2021  Primary Care Provider: McLean-Scocuzza, Nino Glow, MD [place] [address]  Chief Complaint: Chest pain  HPI:  Marcus Roberts is a 45 y.o. male with history of coronary artery disease status post PCI to the proximal/mid LAD (02/2019) with residual disease in the distal LCx and RCA, GERD, obesity status post gastric sleeve, nephrolithiasis, and anxiety/depression, who presents for follow-up of coronary artery disease with increasing chest pain.  He was last seen in our office in May by Marcus Montana, NP, for follow-up of his chest pain after increasing ranolazine.  Today, Marcus Roberts reports that he has been experiencing more migratory chest pains over the last few weeks.  He had it initially felt quite a bit of improvement with escalation of ranolazine earlier this year.  Of note, the pain is different in quality and intensity compared to what he experienced prior to his LAD PCI almost 2 years ago.  He has tried taking an antacid a few times but did not experience any improvement.  He wonders if a recent trial of sertraline may have contributed to a couple of issues including weight gain and involuntary muscle movements as well as recrudescence of the aforementioned chest pain.  He stopped taking sertraline about a week ago but continues to have some of the aforementioned symptoms.  He has not had any significant edema and has not needed to take his as needed furosemide.  He denies shortness of breath, palpitations, and lightheadedness.  --------------------------------------------------------------------------------------------------  Past Medical History:  Diagnosis Date   Anxiety    Aortic atherosclerosis (HCC)    CAD (coronary artery disease)    a. 12/2018 ETT: Ex time 9:39. 46mm horiz ST dep in V4-V6 with abnormal coronary CTA. b. Cath 02/2019 with multivessel CAD s/p orbital atherectomy/DES to prox-mid LAD, EF normal.   Chest pain     Depression    GERD (gastroesophageal reflux disease)    History of echocardiogram    a. 12/2018 Echo: EF 60-65%, no rwma. Mildly dil LA.   Hyperlipidemia    Kidney stones    Past Surgical History:  Procedure Laterality Date   CHOLECYSTECTOMY     CORONARY ATHERECTOMY N/A 02/13/2019   Procedure: CORONARY ATHERECTOMY;  Surgeon: Nelva Bush, MD;  Location: [facility];  Service: Cardiovascular;  Laterality: N/A;   CORONARY STENT INTERVENTION N/A 02/13/2019   Procedure: CORONARY STENT INTERVENTION;  Surgeon: Nelva Bush, MD;  Location: [facility];  Service: Cardiovascular;  Laterality: N/A;   INTRAVASCULAR PRESSURE WIRE/FFR STUDY N/A 02/13/2019   Procedure: INTRAVASCULAR PRESSURE WIRE/FFR STUDY;  Surgeon: Nelva Bush, MD;  Location: [facility];  Service: Cardiovascular;  Laterality: N/A;   INTRAVASCULAR ULTRASOUND/IVUS N/A 02/13/2019   Procedure: Intravascular Ultrasound/IVUS;  Surgeon: Nelva Bush, MD;  Location: [facility];  Service: Cardiovascular;  Laterality: N/A;   LEFT HEART CATH AND CORONARY ANGIOGRAPHY N/A 02/13/2019   Procedure: LEFT HEART CATH AND CORONARY ANGIOGRAPHY;  Surgeon: Nelva Bush, MD;  Location: [facility];  Service: Cardiovascular;  Laterality: N/A;   vertical sleeve gastrectomy     2013 85% stomach removed was 280 lbs before surgery     Current Meds  Medication Sig   amLODipine (NORVASC) 5 MG tablet Take 1 tablet (5 mg total) by mouth daily. NO FURTHER REFILLS UNTIL SEEN IN CLINIC. PLEASE CALL OFFICE TO SCHEDULE APPOINTMENT.   aspirin EC 81 MG tablet Take 81 mg by mouth daily.  buPROPion (WELLBUTRIN XL) 150 MG 24 hr tablet Take 1 tablet (150 mg total) by mouth daily with breakfast.   clopidogrel (PLAVIX) 75 MG tablet TAKE 1 TABLET BY MOUTH DAILY. PLEASE CALL OFFICE TO SCHEDULE APPOINTMENT FOR FURTHER REFILLS.   cyclobenzaprine (FLEXERIL) 10 MG tablet TAKE 1 TABLET BY MOUTH AT BEDTIME AS NEEDED FOR MUSCLE  SPASMS   DENTA 5000 PLUS 1.1 % CREA dental cream See admin instructions.   fluticasone (FLONASE) 50 MCG/ACT nasal spray Place 1 spray into both nostrils every evening.    LORazepam (ATIVAN) 1 MG tablet TAKE 1 TABLET BY MOUTH DAILY AS NEEDED FOR ANXIETY   Multiple Vitamin (MULTIVITAMIN WITH MINERALS) TABS tablet Take 1 tablet by mouth daily.   nitroGLYCERIN (NITROSTAT) 0.4 MG SL tablet PLACE 1 TABLET (0.4 MG TOTAL) UNDER THE TONGUE EVERY 5 (FIVE) MINUTES AS NEEDED FOR CHEST PAIN.   pantoprazole (PROTONIX) 40 MG tablet TAKE 1 TABLET (40 MG TOTAL) BY MOUTH DAILY. 30 MIN BEFORE BREAKFAST OR DINNER *STOP NEXIUM   ranolazine (RANEXA) 1000 MG SR tablet TAKE 1 TABLET BY MOUTH TWICE A DAY   rosuvastatin (CRESTOR) 40 MG tablet Take 1 tablet (40 mg total) by mouth daily.   tadalafil (CIALIS) 20 MG tablet 1 tab 1 hour prior to intercourse.  Do not take nitroglycerin within 36 hours of taking tadalafil   [DISCONTINUED] furosemide (LASIX) 20 MG tablet Take 1 tablet (20 mg total) by mouth daily as needed for edema.    Allergies: Imdur [isosorbide nitrate]  Social History   Tobacco Use   Smoking status: Never   Smokeless tobacco: Never  Vaping Use   Vaping Use: Never used  Substance Use Topics   Alcohol use: Yes    Comment: occas. 2-3 x per month   Drug use: Never    Family History  Problem Relation Age of Onset   Diabetes Father    Heart disease Father        x2   Hyperlipidemia Father    Hypertension Father    Heart attack Father 80       x2; CABG x 3 11/2019   Heart disease Brother        MI in 78s   Heart attack Brother 40    Review of Systems: A 12-system review of systems was performed and was negative except as noted in the HPI.  --------------------------------------------------------------------------------------------------  Physical Exam: BP 104/78 (BP Location: Left Arm, Patient Position: Sitting, Cuff Size: Large)    Pulse 72    Ht 5\' 6"  (1.676 m)    Wt 209 lb (94.8 kg)     SpO2 99%    BMI 33.73 kg/m   General:  NAD. Neck: No JVD or HJR. Lungs: Clear to auscultation bilaterally without wheezes or crackles. Heart: Regular rate and rhythm without murmurs, rubs, or gallops. Abdomen: Soft, nontender, nondistended. Extremities: Trace pretibial edema bilaterally.  EKG: Normal sinus rhythm without abnormality.  Lab Results  Component Value Date   WBC 5.7 02/01/2021   HGB 14.8 02/01/2021   HCT 43.7 02/01/2021   MCV 95 02/01/2021   PLT 227 02/01/2021    Lab Results  Component Value Date   NA 138 02/01/2021   K 4.4 02/01/2021   CL 101 02/01/2021   CO2 27 02/01/2021   BUN 9 02/01/2021   CREATININE 1.00 02/01/2021   GLUCOSE 97 02/01/2021   ALT 13 02/01/2021    Lab Results  Component Value Date   CHOL 148 02/01/2021   HDL 61  02/01/2021   LDLCALC 72 02/01/2021   TRIG 75 02/01/2021   CHOLHDL 2.4 02/01/2021    --------------------------------------------------------------------------------------------------  ASSESSMENT AND PLAN: Coronary artery disease with atypical angina: Marcus Roberts reports some worsening in his migratory chest pain that had previously been alleviated with ranolazine.  The pain is not quite the same as what he experienced prior to his PCI in 02/2018.  This has been confounded by weight gain and muscle twitches that seem to have been associated with a trial of sertraline.  He has been off sertraline for about a week.  We discussed conservative management versus repeat ischemia evaluation (MPI versus catheterization) and have agreed to the former option.  I will check a CBC, CMP, and TSH today.  We will have Marcus Roberts follow-up in 2 weeks.  If his symptoms do not improve, we will move forward with cardiac catheterization, as we both agree that utility of MPI would be limited in the setting of his known CTO's of the distal LCx and RCA branches.  In the meantime, I will also have Marcus Roberts begin taking furosemide 20 mg on a daily basis in  case some element of fluid retention is contributing to his symptoms (trace edema and significant weight gain over the last few months noted).  Hypertension: Blood pressure very well controlled.  Continue current medications.  Hyperlipidemia: We will recheck a CMP and lipid panel today to ensure that LDL remains less than 70.  If not, we will need to consider escalation of rosuvastatin to 40 mg daily.  Muscle twitches: Symptoms began around the time that Marcus Roberts was placed on sertraline.  He has been off the medication for about a week but still notices some muscle twitches.  I agree with continuing to hold sertraline.  We will check electrolytes and TSH today.  I will defer further work-up to his PCP.  Follow-up: Return to clinic in 2 weeks.  Nelva Bush, MD 02/02/2021 6:53 AM

## 2021-02-01 ENCOUNTER — Ambulatory Visit: Payer: 59 | Admitting: Internal Medicine

## 2021-02-01 ENCOUNTER — Encounter: Payer: Self-pay | Admitting: Internal Medicine

## 2021-02-01 ENCOUNTER — Other Ambulatory Visit: Payer: Self-pay

## 2021-02-01 VITALS — BP 104/78 | HR 72 | Ht 66.0 in | Wt 209.0 lb

## 2021-02-01 DIAGNOSIS — I25118 Atherosclerotic heart disease of native coronary artery with other forms of angina pectoris: Secondary | ICD-10-CM | POA: Diagnosis not present

## 2021-02-01 DIAGNOSIS — E785 Hyperlipidemia, unspecified: Secondary | ICD-10-CM

## 2021-02-01 DIAGNOSIS — I200 Unstable angina: Secondary | ICD-10-CM | POA: Diagnosis not present

## 2021-02-01 DIAGNOSIS — R0602 Shortness of breath: Secondary | ICD-10-CM | POA: Diagnosis not present

## 2021-02-01 MED ORDER — FUROSEMIDE 20 MG PO TABS
20.0000 mg | ORAL_TABLET | Freq: Every day | ORAL | 3 refills | Status: DC
Start: 2021-02-01 — End: 2021-07-05

## 2021-02-01 NOTE — Patient Instructions (Signed)
Medication Instructions:  Your physician recommends that you continue on your current medications as directed. Please refer to the Current Medication list given to you today.   INCREASE Lasix (Furosemide) to 20 mg DAILY   *If you need a refill on your cardiac medications before your next appointment, please call your pharmacy*   Lab Work:  Your provider has ordered labs (CBC, CMET, TSH, lipid profile) to be drawn prior to you leaving the office today.  If you have labs (blood work) drawn today and your tests are completely normal, you will receive your results only by: [place] (if you have MyChart) OR A paper copy in the mail If you have any lab test that is abnormal or we need to change your treatment, we will call you to review the results.   Testing/Procedures: None ordered   Follow-Up: At [facility], you and your health needs are our priority.  As part of our continuing mission to provide you with exceptional heart care, we have created designated Provider Care Teams.  These Care Teams include your primary Cardiologist (physician) and Advanced Practice Providers (APPs -  Physician Assistants and Nurse Practitioners) who all work together to provide you with the care you need, when you need it.  We recommend signing up for the patient portal called "MyChart".  Sign up information is provided on this After Visit Summary.  MyChart is used to connect with patients for Virtual Visits (Telemedicine).  Patients are able to view lab/test results, encounter notes, upcoming appointments, etc.  Non-urgent messages can be sent to your provider as well.   To learn more about what you can do with MyChart, go to [email or web address].    Your next appointment:   2 week(s)  The format for your next appointment:   In Person  Provider:   You may see Nelva Bush, MD or one of the following Advanced Practice Providers on your designated Care Team:   Murray Hodgkins, NP Christell Faith, PA-C Cadence Kathlen Mody, [place]    Other Instructions N/A

## 2021-02-02 ENCOUNTER — Encounter: Payer: Self-pay | Admitting: Internal Medicine

## 2021-02-02 ENCOUNTER — Telehealth: Payer: Self-pay | Admitting: Emergency Medicine

## 2021-02-02 LAB — CBC
Hematocrit: 43.7 % (ref 37.5–51.0)
Hemoglobin: 14.8 g/dL (ref 13.0–17.7)
MCH: 32.1 pg (ref 26.6–33.0)
MCHC: 33.9 g/dL (ref 31.5–35.7)
MCV: 95 fL (ref 79–97)
Platelets: 227 10*3/uL (ref 150–450)
RBC: 4.61 x10E6/uL (ref 4.14–5.80)
RDW: 12.1 % (ref 11.6–15.4)
WBC: 5.7 10*3/uL (ref 3.4–10.8)

## 2021-02-02 LAB — COMPREHENSIVE METABOLIC PANEL
ALT: 13 IU/L (ref 0–44)
AST: 18 IU/L (ref 0–40)
Albumin/Globulin Ratio: 1.8 (ref 1.2–2.2)
Albumin: 4.6 g/dL (ref 4.0–5.0)
Alkaline Phosphatase: 71 IU/L (ref 44–121)
BUN/Creatinine Ratio: 9 (ref 9–20)
BUN: 9 mg/dL (ref 6–24)
Bilirubin Total: 0.4 mg/dL (ref 0.0–1.2)
CO2: 27 mmol/L (ref 20–29)
Calcium: 9.5 mg/dL (ref 8.7–10.2)
Chloride: 101 mmol/L (ref 96–106)
Creatinine, Ser: 1 mg/dL (ref 0.76–1.27)
Globulin, Total: 2.5 g/dL (ref 1.5–4.5)
Glucose: 97 mg/dL (ref 70–99)
Potassium: 4.4 mmol/L (ref 3.5–5.2)
Sodium: 138 mmol/L (ref 134–144)
Total Protein: 7.1 g/dL (ref 6.0–8.5)
eGFR: 96 mL/min/{1.73_m2} (ref >59)

## 2021-02-02 LAB — TSH: TSH: 1.87 u[IU]/mL (ref 0.450–4.500)

## 2021-02-02 LAB — LIPID PANEL
Chol/HDL Ratio: 2.4 ratio (ref 0.0–5.0)
Cholesterol, Total: 148 mg/dL (ref 100–199)
HDL: 61 mg/dL (ref >39)
LDL Chol Calc (NIH): 72 mg/dL (ref 0–99)
Triglycerides: 75 mg/dL (ref 0–149)
VLDL Cholesterol Cal: 15 mg/dL (ref 5–40)

## 2021-02-02 NOTE — Telephone Encounter (Signed)
-----   Message from Nelva Bush, MD sent at 02/02/2021  4:33 PM EST ----- Please let Mr. Hailes know that his blood counts, thyroid function, kidney function, and electrolytes are normal without any findings to explain his recent symptoms.  His bad cholesterol (LDL) is slightly above goal at 72.  I recommend that we add ezetimibe 10 mg daily in an effort to bring his LDL below 70 (ideally below 50).

## 2021-02-02 NOTE — Telephone Encounter (Signed)
Called patient to go over results and Dr. Darnelle Bos recommendations.   No answer, lmtcb.

## 2021-02-03 ENCOUNTER — Telehealth: Payer: Self-pay | Admitting: *Deleted

## 2021-02-03 NOTE — Telephone Encounter (Signed)
Left voicemail message to call back for review of results and recommendations.  

## 2021-02-03 NOTE — Telephone Encounter (Signed)
-----   Message from Nelva Bush, MD sent at 02/02/2021  4:33 PM EST ----- Please let Mr. Ledyard know that his blood counts, thyroid function, kidney function, and electrolytes are normal without any findings to explain his recent symptoms.  His bad cholesterol (LDL) is slightly above goal at 72.  I recommend that we add ezetimibe 10 mg daily in an effort to bring his LDL below 70 (ideally below 50).

## 2021-02-06 ENCOUNTER — Encounter: Payer: Self-pay | Admitting: Urology

## 2021-02-06 ENCOUNTER — Other Ambulatory Visit: Payer: Self-pay | Admitting: Family

## 2021-02-08 MED ORDER — EZETIMIBE 10 MG PO TABS: 10.0000 mg | ORAL_TABLET | Freq: Every day | ORAL | 3 refills | Status: DC

## 2021-02-08 NOTE — Telephone Encounter (Signed)
Called and spoke to pt.  Notified of lab results and Dr. Darnelle Bos recc.  Pt voiced understanding.  Pt will start Zetia 10 mg daily.  Rx sent to pharmacy.  Pt also reports that his s/s of chest discomfort have resolved after starting Lasix.  Pt will follow up as scheduled.

## 2021-02-22 ENCOUNTER — Other Ambulatory Visit: Payer: Self-pay

## 2021-02-22 ENCOUNTER — Ambulatory Visit: Payer: 59 | Admitting: Urology

## 2021-02-22 ENCOUNTER — Encounter: Payer: Self-pay | Admitting: Urology

## 2021-02-22 VITALS — BP 125/74 | HR 85 | Ht 67.0 in | Wt 210.0 lb

## 2021-02-22 DIAGNOSIS — Z3009 Encounter for other general counseling and advice on contraception: Secondary | ICD-10-CM

## 2021-02-22 NOTE — Progress Notes (Signed)
02/22/2021 7:05 AM   [name] 1975/11/05 [id number]  Referring provider: McLean-Scocuzza, Nino Glow, MD [place],  [place] [id number]  Chief Complaint  Patient presents with   VAS Consult    HPI: Marcus Roberts is a 46 y.o. male who presents for vasectomy counseling.  Recently divorced and has 3 children Denies prior history urologic problems including chronic scrotal pain, epididymitis or orchitis No previous history inguinal hernia or pelvic surgery On Plavix and aspirin for coronary artery disease   PMH: Past Medical History:  Diagnosis Date   Anxiety    Aortic atherosclerosis (HCC)    CAD (coronary artery disease)    a. 12/2018 ETT: Ex time 9:39. 41mm horiz ST dep in V4-V6 with abnormal coronary CTA. b. Cath 02/2019 with multivessel CAD s/p orbital atherectomy/DES to prox-mid LAD, EF normal.   Chest pain    Depression    GERD (gastroesophageal reflux disease)    History of echocardiogram    a. 12/2018 Echo: EF 60-65%, no rwma. Mildly dil LA.   Hyperlipidemia    Kidney stones     Surgical History: Past Surgical History:  Procedure Laterality Date   CHOLECYSTECTOMY     CORONARY ATHERECTOMY N/A 02/13/2019   Procedure: CORONARY ATHERECTOMY;  Surgeon: Nelva Bush, MD;  Location: [facility];  Service: Cardiovascular;  Laterality: N/A;   CORONARY STENT INTERVENTION N/A 02/13/2019   Procedure: CORONARY STENT INTERVENTION;  Surgeon: Nelva Bush, MD;  Location: [facility];  Service: Cardiovascular;  Laterality: N/A;   INTRAVASCULAR PRESSURE WIRE/FFR STUDY N/A 02/13/2019   Procedure: INTRAVASCULAR PRESSURE WIRE/FFR STUDY;  Surgeon: Nelva Bush, MD;  Location: [facility];  Service: Cardiovascular;  Laterality: N/A;   INTRAVASCULAR ULTRASOUND/IVUS N/A 02/13/2019   Procedure: Intravascular Ultrasound/IVUS;  Surgeon: Nelva Bush, MD;  Location: [facility];  Service: Cardiovascular;  Laterality: N/A;   LEFT HEART CATH AND  CORONARY ANGIOGRAPHY N/A 02/13/2019   Procedure: LEFT HEART CATH AND CORONARY ANGIOGRAPHY;  Surgeon: Nelva Bush, MD;  Location: [facility];  Service: Cardiovascular;  Laterality: N/A;   vertical sleeve gastrectomy     2013 85% stomach removed was 280 lbs before surgery     Home Medications:  Allergies as of 02/22/2021       Reactions   Imdur [isosorbide Nitrate]    Did not tolerate due to headache        Medication List        Accurate as of February 22, 2021 11:59 PM. If you have any questions, ask your nurse or doctor.          amLODipine 5 MG tablet Commonly known as: NORVASC Take 1 tablet (5 mg total) by mouth daily. NO FURTHER REFILLS UNTIL SEEN IN CLINIC. PLEASE CALL OFFICE TO SCHEDULE APPOINTMENT.   aspirin EC 81 MG tablet Take 81 mg by mouth daily.   buPROPion 150 MG 24 hr tablet Commonly known as: WELLBUTRIN XL Take 1 tablet (150 mg total) by mouth daily with breakfast.   clopidogrel 75 MG tablet Commonly known as: PLAVIX TAKE 1 TABLET BY MOUTH DAILY. PLEASE CALL OFFICE TO SCHEDULE APPOINTMENT FOR FURTHER REFILLS.   cyclobenzaprine 10 MG tablet Commonly known as: FLEXERIL TAKE 1 TABLET BY MOUTH AT BEDTIME AS NEEDED FOR MUSCLE SPASMS   Denta 5000 Plus 1.1 % Crea dental cream Generic drug: sodium fluoride See admin instructions.   ezetimibe 10 MG tablet Commonly known as: ZETIA Take 1 tablet (10 mg  total) by mouth daily.   fluticasone 50 MCG/ACT nasal spray Commonly known as: FLONASE Place 1 spray into both nostrils every evening.   furosemide 20 MG tablet Commonly known as: LASIX Take 1 tablet (20 mg total) by mouth daily.   LORazepam 1 MG tablet Commonly known as: ATIVAN TAKE 1 TABLET BY MOUTH DAILY AS NEEDED FOR ANXIETY   multivitamin with minerals Tabs tablet Take 1 tablet by mouth daily.   nitroGLYCERIN 0.4 MG SL tablet Commonly known as: NITROSTAT PLACE 1 TABLET (0.4 MG TOTAL) UNDER THE TONGUE EVERY 5 (FIVE) MINUTES AS  NEEDED FOR CHEST PAIN.   pantoprazole 40 MG tablet Commonly known as: PROTONIX TAKE 1 TABLET (40 MG TOTAL) BY MOUTH DAILY. 30 MIN BEFORE BREAKFAST OR DINNER *STOP NEXIUM   ranolazine 1000 MG SR tablet Commonly known as: RANEXA TAKE 1 TABLET BY MOUTH TWICE A DAY   rosuvastatin 40 MG tablet Commonly known as: CRESTOR Take 1 tablet (40 mg total) by mouth daily.   tadalafil 20 MG tablet Commonly known as: CIALIS 1 tab 1 hour prior to intercourse.  Do not take nitroglycerin within 36 hours of taking tadalafil        Allergies:  Allergies  Allergen Reactions   Imdur [Isosorbide Nitrate]     Did not tolerate due to headache    Family History: Family History  Problem Relation Age of Onset   Diabetes Father    Heart disease Father        x2   Hyperlipidemia Father    Hypertension Father    Heart attack Father 31       x2; CABG x 3 11/2019   Heart disease Brother        MI in 72s   Heart attack Brother 42    Social History:  reports that he has never smoked. He has never used smokeless tobacco. He reports current alcohol use. He reports that he does not use drugs.   Physical Exam: BP 125/74    Pulse 85    Ht 5\' 7"  (1.702 m)    Wt 210 lb (95.3 kg)    BMI 32.89 kg/m   Constitutional:  Alert and oriented, No acute distress. HEENT: [place] AT, moist mucus membranes.  Trachea midline, no masses. Cardiovascular: No clubbing, cyanosis, or edema. Respiratory: Normal respiratory effort, no increased work of breathing. GI: Abdomen is soft, nontender, nondistended, no abdominal masses GU: Phallus without lesions, testes descended bilaterally without masses or tenderness, spermatic cord/epididymis palpably normal bilaterally.  Vasa palpable bilaterally Psychiatric: Normal mood and affect.   Assessment & Plan:    1.  Undesired fertility He states he may be scheduled for a cardiac catheterization in the near future and would like to wait until after this before proceeding with  vasectomy.  He indicated he would call back to schedule if he desires to proceed.  We will need cardiology clearance to hold aspirin and Plavix. We had a long discussion about vasectomy. We specifically discussed the procedure, recovery and the risks, benefits and alternatives of vasectomy. I explained that the procedure entails removal of a segment of each vas deferens, each of which conducts sperm, and that the purpose of this procedure is to cause sterility (inability to produce children or cause pregnancy). Vasectomy is intended to be permanent and irreversible form of contraception. Options for fertility after vasectomy include vasectomy reversal, or sperm retrieval with in vitro fertilization. These options are not always successful, and they may be expensive. We discussed  reversible forms of birth control such as condoms, IUD or diaphragms, as well as the option of freezing sperm in a sperm bank prior to the vasectomy procedure. We discussed the importance of avoiding strenuous exercise for four days after vasectomy, and the importance of refraining from any form of ejaculation for seven days after vasectomy. I explained that vasectomy does not produce immediate sterility so another form of contraceptive must be used until sterility is assured by having semen checked for sperm. Thus, a post vasectomy semen analysis is necessary to confirm sterility. Rarely, vasectomy must be repeated. We discussed the approximately 1 in 2,000 risk of pregnancy after vasectomy for men who have post-vasectomy semen analysis showing absent sperm or rare non-motile sperm. Typical side effects include a small amount of oozing blood, some discomfort and mild swelling in the area of incision.  Vasectomy does not affect sexual performance, function, please, sensation, interest, desire, satisfaction, penile erection, volume of semen or ejaculation. Other rare risks include allergy or adverse reaction to an anesthetic, testicular  atrophy, hematoma, infection/abscess, prolonged tenderness of the vas deferens, pain, swelling, painful nodule or scar (called sperm granuloma) or epididymtis. We discussed chronic testicular pain syndrome. This has been reported to occur in as many as 1-2% of men and may be permanent. This can be treated with medication, small procedures or (rarely) surgery.    Abbie Sons, [place] [address] [place], [place] [address] [phone]

## 2021-02-23 ENCOUNTER — Ambulatory Visit: Payer: 59 | Admitting: Nurse Practitioner

## 2021-02-23 ENCOUNTER — Other Ambulatory Visit
Admission: RE | Admit: 2021-02-23 | Discharge: 2021-02-23 | Disposition: A | Discharge status: Home / Self Care | Payer: 59 | Attending: Nurse Practitioner | Admitting: Nurse Practitioner

## 2021-02-23 ENCOUNTER — Encounter: Payer: Self-pay | Admitting: Nurse Practitioner

## 2021-02-23 ENCOUNTER — Encounter: Payer: Self-pay | Admitting: Urology

## 2021-02-23 VITALS — BP 110/72 | HR 78 | Ht 67.0 in | Wt 212.0 lb

## 2021-02-23 DIAGNOSIS — E785 Hyperlipidemia, unspecified: Secondary | ICD-10-CM | POA: Insufficient documentation

## 2021-02-23 DIAGNOSIS — R072 Precordial pain: Secondary | ICD-10-CM

## 2021-02-23 DIAGNOSIS — I25118 Atherosclerotic heart disease of native coronary artery with other forms of angina pectoris: Secondary | ICD-10-CM | POA: Diagnosis present

## 2021-02-23 DIAGNOSIS — Z01812 Encounter for preprocedural laboratory examination: Secondary | ICD-10-CM | POA: Insufficient documentation

## 2021-02-23 DIAGNOSIS — Z01818 Encounter for other preprocedural examination: Secondary | ICD-10-CM | POA: Diagnosis not present

## 2021-02-23 LAB — BASIC METABOLIC PANEL
Anion gap: 8 (ref 5–15)
BUN: 10 mg/dL (ref 6–20)
CO2: 28 mmol/L (ref 22–32)
Calcium: 9.2 mg/dL (ref 8.9–10.3)
Chloride: 102 mmol/L (ref 98–111)
Creatinine, Ser: 1.07 mg/dL (ref 0.61–1.24)
GFR, Estimated: >60 mL/min (ref >60)
Glucose, Bld: 96 mg/dL (ref 70–99)
Potassium: 3.9 mmol/L (ref 3.5–5.1)
Sodium: 138 mmol/L (ref 135–145)

## 2021-02-23 LAB — CBC
HCT: 40.9 % (ref 39.0–52.0)
Hemoglobin: 14.3 g/dL (ref 13.0–17.0)
MCH: 32 pg (ref 26.0–34.0)
MCHC: 35 g/dL (ref 30.0–36.0)
MCV: 91.5 fL (ref 80.0–100.0)
Platelets: 232 10*3/uL (ref 150–400)
RBC: 4.47 MIL/uL (ref 4.22–5.81)
RDW: 12.4 % (ref 11.5–15.5)
WBC: 7.1 10*3/uL (ref 4.0–10.5)
nRBC: 0 % (ref 0.0–0.2)

## 2021-02-23 NOTE — Progress Notes (Signed)
Office Visit    Patient Name: Marcus Roberts Date of Encounter: 02/23/2021  Primary Care Provider:  McLean-Scocuzza, Nino Glow, MD Primary Cardiologist:  Nelva Bush, MD  Chief Complaint    46 year old male with a history of CAD, hyperlipidemia, GERD, obesity status post gastric sleeve, depression, and nephrolithiasis, who presents for follow-up related to CAD.  Past Medical History    Past Medical History:  Diagnosis Date   Anxiety    Aortic atherosclerosis (HCC)    CAD (coronary artery disease)    a. 12/2018 ETT: Ex time 9:39. 29mm horiz ST dep in V4-V6 with abnormal coronary CTA. b. 02/2019 PCI: LM nl, LAD 60p/m, 45m (3.0x38 Synergy XD DES), 40d, D2 90p, LCX 172m, OM2 filled via collats from OM1, RCA 12m, 60d (FFR 0.8), RPDA 40, RPAV 100 CTO (fills via collats from [place]). EF 55%.   Chest pain    Depression    GERD (gastroesophageal reflux disease)    History of echocardiogram    a. 12/2018 Echo: EF 60-65%, no rwma. Mildly dil LA.   Hyperlipidemia    Kidney stones    Past Surgical History:  Procedure Laterality Date   CHOLECYSTECTOMY     CORONARY ATHERECTOMY N/A 02/13/2019   Procedure: CORONARY ATHERECTOMY;  Surgeon: Nelva Bush, MD;  Location: [facility];  Service: Cardiovascular;  Laterality: N/A;   CORONARY STENT INTERVENTION N/A 02/13/2019   Procedure: CORONARY STENT INTERVENTION;  Surgeon: Nelva Bush, MD;  Location: [facility];  Service: Cardiovascular;  Laterality: N/A;   INTRAVASCULAR PRESSURE WIRE/FFR STUDY N/A 02/13/2019   Procedure: INTRAVASCULAR PRESSURE WIRE/FFR STUDY;  Surgeon: Nelva Bush, MD;  Location: [facility] [facility];  Service: Cardiovascular;  Laterality: N/A;   INTRAVASCULAR ULTRASOUND/IVUS N/A 02/13/2019   Procedure: Intravascular Ultrasound/IVUS;  Surgeon: Nelva Bush, MD;  Location: [facility];  Service: Cardiovascular;  Laterality: N/A;   LEFT HEART CATH AND CORONARY ANGIOGRAPHY N/A 02/13/2019   Procedure: LEFT  HEART CATH AND CORONARY ANGIOGRAPHY;  Surgeon: Nelva Bush, MD;  Location: [facility];  Service: Cardiovascular;  Laterality: N/A;   vertical sleeve gastrectomy     2013 85% stomach removed was 280 lbs before surgery     Allergies  Allergies  Allergen Reactions   Imdur [Isosorbide Nitrate]     Did not tolerate due to headache    History of Present Illness    46 year old male with above past medical history including CAD, hyperlipidemia, GERD, obesity status post gastric sleeve, depression, and nephrolithiasis.  In November 2020, he was evaluated secondary to intermittent chest tightness and burning.  Exercise treadmill testing was abnormal with 2 mm horizontal ST segment depression in leads V4 through V6.  He subsequently underwent coronary CT angiography which showed a calcium score of 923 (99th percentile), and greater than 70% proximal and mid stenosis within the LAD and also in the distal RCA.  Subsequent diagnostic catheterization showed a 90% mid LAD stenosis, 90% proximal second diagonal stenosis, and occluded left circumflex and occluded RPA V with left to left and left to right collaterals.  The mid LAD was successfully intervened upon using a 3.0 x 38 mm Synergy XD drug-eluting stent.  He was subsequently placed on ranolazine for intermittent chest pain in early 2022, this was titrated with initial good response.  At December 2022 follow-up visit, he reported several week history of intermittent chest pain, somewhat different from prior angina.  There was some concern that perhaps symptoms were associated with  a recent trial of sertraline, which was subsequently discontinued.  An initial conservative approach was advised with plan for diagnostic catheterization for recurrent chest pain.  He was also advised to take furosemide 20 mg daily.  Labs returned that day with an LDL of 72 and he was advised to start Zetia 10 mg daily.  After starting Zetia, he noted some progression of  the frequency of his chest pain symptoms and therefore, he stopped it.  Symptoms improved for a few days but then returned.  He describes his chest pain is intermittently sharp but mostly more like a dull ache, typically occurring several times a day on varied locations across his chest with either rest or exertion, lasting just a few seconds, and resolving spontaneously.  Sometimes the pain is sharp and moves into his neck.  He notes that with exertion, he is likely to have a mild discomfort in the center of his chest sometimes associate with dyspnea, lasting up to about 15 minutes.  This type of discomfort typically resolves even though he continues to walk.  Overall, he is relatively sedentary.  Due to ongoing symptoms, he is interested in pursuing diagnostic catheterization.  He denies palpitations, PND, orthopnea, dizziness, syncope, edema, or early satiety.  Home Medications    Current Outpatient Medications  Medication Sig Dispense Refill   amLODipine (NORVASC) 5 MG tablet Take 1 tablet (5 mg total) by mouth daily. NO FURTHER REFILLS UNTIL SEEN IN CLINIC. PLEASE CALL OFFICE TO SCHEDULE APPOINTMENT. 90 tablet 0   aspirin EC 81 MG tablet Take 81 mg by mouth daily.      buPROPion (WELLBUTRIN XL) 150 MG 24 hr tablet Take 1 tablet (150 mg total) by mouth daily with breakfast. 60 tablet 3   clopidogrel (PLAVIX) 75 MG tablet TAKE 1 TABLET BY MOUTH DAILY. PLEASE CALL OFFICE TO SCHEDULE APPOINTMENT FOR FURTHER REFILLS. 90 tablet 0   cyclobenzaprine (FLEXERIL) 10 MG tablet TAKE 1 TABLET BY MOUTH AT BEDTIME AS NEEDED FOR MUSCLE SPASMS 90 tablet 1   DENTA 5000 PLUS 1.1 % CREA dental cream See admin instructions.     fluticasone (FLONASE) 50 MCG/ACT nasal spray Place 1 spray into both nostrils every evening.      furosemide (LASIX) 20 MG tablet Take 1 tablet (20 mg total) by mouth daily. 90 tablet 3   LORazepam (ATIVAN) 1 MG tablet TAKE 1 TABLET BY MOUTH DAILY AS NEEDED FOR ANXIETY 30 tablet 5   Multiple  Vitamin (MULTIVITAMIN WITH MINERALS) TABS tablet Take 1 tablet by mouth daily.     nitroGLYCERIN (NITROSTAT) 0.4 MG SL tablet PLACE 1 TABLET (0.4 MG TOTAL) UNDER THE TONGUE EVERY 5 (FIVE) MINUTES AS NEEDED FOR CHEST PAIN. 25 tablet 1   pantoprazole (PROTONIX) 40 MG tablet TAKE 1 TABLET (40 MG TOTAL) BY MOUTH DAILY. 30 MIN BEFORE BREAKFAST OR DINNER *STOP NEXIUM 90 tablet 3   ranolazine (RANEXA) 1000 MG SR tablet TAKE 1 TABLET BY MOUTH TWICE A DAY 180 tablet 3   rosuvastatin (CRESTOR) 40 MG tablet Take 1 tablet (40 mg total) by mouth daily. 90 tablet 3   tadalafil (CIALIS) 20 MG tablet 1 tab 1 hour prior to intercourse.  Do not take nitroglycerin within 36 hours of taking tadalafil 30 tablet 5   ezetimibe (ZETIA) 10 MG tablet Take 1 tablet (10 mg total) by mouth daily. (Patient not taking: Reported on 02/23/2021) 30 tablet 3   No current facility-administered medications for this visit.    Family History  Family History  Problem Relation Age of Onset   Diabetes Father    Heart disease Father        x2   Hyperlipidemia Father    Hypertension Father    Heart attack Father 66       x2; CABG x 3 11/2019   Heart disease Brother        MI in 27s   Heart attack Brother 43    Social History    Social History   Socioeconomic History   Marital status: Married    Spouse name: Not on file   Number of children: Not on file   Years of education: Not on file   Highest education level: Not on file  Occupational History   Occupation: medical coding     Comment: [employer]  Tobacco Use   Smoking status: Never   Smokeless tobacco: Never  Vaping Use   Vaping Use: Never used  Substance and Sexual Activity   Alcohol use: Yes    Comment: occas. 2-3 x per month   Drug use: Never   Sexual activity: Yes    Partners: Female  Other Topics Concern   Not on file  Social History Narrative   Moved from [place]    Divorced   Some college    Social Determinants of Health   Financial Resource  Strain: Not on file  Food Insecurity: Not on file  Transportation Needs: Not on file  Physical Activity: Not on file  Stress: Not on file  Social Connections: Not on file  Intimate Partner Violence: Not on file     Review of Systems    Ongoing intermittent, relatively fleeting, and focal chest discomfort across varied locations on his chest and back.  He is also had sharp pain in his neck, as well as substernal pressure.  He has had some dyspnea on exertion.  He denies palpitations, PND, orthopnea, dizziness, syncope, edema, or early satiety all other systems reviewed and are otherwise negative except as noted above.    Physical Exam    VS:  BP 110/72 (BP Location: Left Arm, Patient Position: Sitting, Cuff Size: Normal)    Pulse 78    Ht 5\' 7"  (1.702 m)    Wt 212 lb (96.2 kg)    SpO2 99%    BMI 33.20 kg/m  , BMI Body mass index is 33.2 kg/m.     GEN: Well nourished, well developed, in no acute distress. HEENT: normal. Neck: Supple, no JVD, carotid bruits, or masses. Cardiac: RRR, no murmurs, rubs, or gallops. No clubbing, cyanosis, edema.  Radials/DP/PT 2+ and equal bilaterally.  Normal Allen's test bilaterally. Respiratory:  Respirations regular and unlabored, clear to auscultation bilaterally. GI: Soft, nontender, nondistended, BS + x 4. MS: no deformity or atrophy. Skin: warm and dry, no rash. Neuro:  Strength and sensation are intact. Psych: Normal affect.  Accessory Clinical Findings    Lab Results  Component Value Date   WBC 5.7 02/01/2021   HGB 14.8 02/01/2021   HCT 43.7 02/01/2021   MCV 95 02/01/2021   PLT 227 02/01/2021   Lab Results  Component Value Date   CREATININE 1.00 02/01/2021   BUN 9 02/01/2021   NA 138 02/01/2021   K 4.4 02/01/2021   CL 101 02/01/2021   CO2 27 02/01/2021   Lab Results  Component Value Date   ALT 13 02/01/2021   AST 18 02/01/2021   ALKPHOS 71 02/01/2021   BILITOT 0.4 02/01/2021   Lab  Results  Component Value Date   CHOL 148  02/01/2021   HDL 61 02/01/2021   LDLCALC 72 02/01/2021   TRIG 75 02/01/2021   CHOLHDL 2.4 02/01/2021    Lab Results  Component Value Date   HGBA1C 4.9 01/26/2020    Assessment & Plan    1.  Precordial chest pain/CAD: Patient with a history of CAD status post mid LAD stenting in January 2021 with a total occlusion of the mid left circumflex and totaled RPAV, with left to left and left to right collaterals supplying those areas.  Patient has been experiencing relatively atypical, intermittent, dull and sharp, relatively focal discomfort in varied areas across his chest and back throughout the day with rest and exertion, sometimes associated with dyspnea, and sometimes moving into his neck.  He has also had heaviness in his chest when he walks which resolves despite ongoing walking.  Initially, it was felt that sertraline may have contributed the symptoms but symptoms have persisted despite discontinuation of sertraline.  Given ongoing symptoms and history of aggressive coronary artery disease, we have agreed to pursue diagnostic catheterization.  The patient understands that risks include but are not limited to stroke (1 in 1000), death (1 in 48), kidney failure [usually temporary] (1 in 500), bleeding (1 in 200), allergic reaction [possibly serious] (1 in 200), and agrees to proceed.  I will check a CBC and basic metabolic panel today.  Continue aspirin, Plavix, statin, ranolazine, and calcium channel blocker therapy.  He was recently placed on Zetia but noted worsening of symptoms and therefore discontinued.  2.  Hyperlipidemia: LDL of 72.  He is on rosuvastatin 40 mg and was recent placed on Zetia however, he felt that Zetia worsened his symptoms and discontinued.  We can consider PCSK9 inhibitor therapy in addition to lifestyle modifications following catheterization.  3.  Morbid obesity: BMI of 33.20.  Status post prior gastric sleeve.  Over the past 3 months, weight is up 19 pounds on our  scale.  Pending catheterization, may benefit from cardiac rehabilitation if eligible, or further lifestyle modifications and regular activity otherwise.  4.  Disposition: Follow-up CBC and basic metabolic panel today.  Plan for diagnostic catheterization next week.  Follow-up in clinic approximately 2 weeks post catheterization.   Murray Hodgkins, NP 02/23/2021, 8:45 AM

## 2021-02-23 NOTE — Patient Instructions (Signed)
Medication Instructions:  No changes at this time.   *If you need a refill on your cardiac medications before your next appointment, please call your pharmacy*   Lab Work: [place] today  If you have labs (blood work) drawn today and your tests are completely normal, you will receive your results only by: [place] (if you have MyChart) OR A paper copy in the mail If you have any lab test that is abnormal or we need to change your treatment, we will call you to review the results.   Testing/Procedures: [facility] Cardiac Cath Instructions  You are scheduled for a Cardiac Cath on: Friday January 27th Please arrive at 07:30 am on the day of your procedure Please expect a call from our [place] to pre-register you Do not eat/drink anything after midnight Someone will need to drive you home It is recommended someone be with you for the first 24 hours after your procedure Wear clothes that are easy to get on/off and wear slip on shoes if possible   Medications bring a current list of all medications with you  _XX__ Do not take these medications before your procedure: No furosemide.   _XX__ You may take all of your other medications the morning of your procedure with enough water to swallow safely  Day of your procedure: Arrive at the [place] entrance.  Free valet service is available.  After entering the [place] please check-in at the registration desk (1st desk on your right) to receive your armband. After receiving your armband someone will escort you to the cardiac cath/special procedures waiting area.  The usual length of stay after your procedure is about 2 to 3 hours.  This can vary.  If you have any questions, please call our office at [phone], or you may call the cardiac cath lab at [facility] directly at [phone]   Follow-Up: At [facility], you and your health needs are our priority.  As part of our continuing mission to provide  you with exceptional heart care, we have created designated Provider Care Teams.  These Care Teams include your primary Cardiologist (physician) and Advanced Practice Providers (APPs -  Physician Assistants and Nurse Practitioners) who all work together to provide you with the care you need, when you need it.   Your next appointment:   3 week(s)  The format for your next appointment:   In Person  Provider:   Nelva Bush, MD or Murray Hodgkins, NP

## 2021-02-23 NOTE — H&P (View-Only) (Signed)
Office Visit    Patient Name: Marcus Roberts Date of Encounter: 02/23/2021  Primary Care Provider:  McLean-Scocuzza, Nino Glow, MD Primary Cardiologist:  Nelva Bush, MD  Chief Complaint    46 year old male with a history of CAD, hyperlipidemia, GERD, obesity status post gastric sleeve, depression, and nephrolithiasis, who presents for follow-up related to CAD.  Past Medical History    Past Medical History:  Diagnosis Date   Anxiety    Aortic atherosclerosis (HCC)    CAD (coronary artery disease)    a. 12/2018 ETT: Ex time 9:39. 46mm horiz ST dep in V4-V6 with abnormal coronary CTA. b. 02/2019 PCI: LM nl, LAD 60p/m, 20m (3.0x38 Synergy XD DES), 40d, D2 90p, LCX 180m, OM2 filled via collats from OM1, RCA 40m, 60d (FFR 0.8), RPDA 40, RPAV 100 CTO (fills via collats from [place]). EF 55%.   Chest pain    Depression    GERD (gastroesophageal reflux disease)    History of echocardiogram    a. 12/2018 Echo: EF 60-65%, no rwma. Mildly dil LA.   Hyperlipidemia    Kidney stones    Past Surgical History:  Procedure Laterality Date   CHOLECYSTECTOMY     CORONARY ATHERECTOMY N/A 02/13/2019   Procedure: CORONARY ATHERECTOMY;  Surgeon: Nelva Bush, MD;  Location: [place] [facility];  Service: Cardiovascular;  Laterality: N/A;   CORONARY STENT INTERVENTION N/A 02/13/2019   Procedure: CORONARY STENT INTERVENTION;  Surgeon: Nelva Bush, MD;  Location: [place] [facility];  Service: Cardiovascular;  Laterality: N/A;   INTRAVASCULAR PRESSURE WIRE/FFR STUDY N/A 02/13/2019   Procedure: INTRAVASCULAR PRESSURE WIRE/FFR STUDY;  Surgeon: Nelva Bush, MD;  Location: [place] [facility];  Service: Cardiovascular;  Laterality: N/A;   INTRAVASCULAR ULTRASOUND/IVUS N/A 02/13/2019   Procedure: Intravascular Ultrasound/IVUS;  Surgeon: Nelva Bush, MD;  Location: [place] [facility];  Service: Cardiovascular;  Laterality: N/A;   LEFT HEART CATH AND CORONARY ANGIOGRAPHY N/A 02/13/2019   Procedure: LEFT  HEART CATH AND CORONARY ANGIOGRAPHY;  Surgeon: Nelva Bush, MD;  Location: [place] [facility];  Service: Cardiovascular;  Laterality: N/A;   vertical sleeve gastrectomy     2013 85% stomach removed was 280 lbs before surgery     Allergies  Allergies  Allergen Reactions   Imdur [Isosorbide Nitrate]     Did not tolerate due to headache    History of Present Illness    46 year old male with above past medical history including CAD, hyperlipidemia, GERD, obesity status post gastric sleeve, depression, and nephrolithiasis.  In November 2020, he was evaluated secondary to intermittent chest tightness and burning.  Exercise treadmill testing was abnormal with 2 mm horizontal ST segment depression in leads V4 through V6.  He subsequently underwent coronary CT angiography which showed a calcium score of 923 (99th percentile), and greater than 70% proximal and mid stenosis within the LAD and also in the distal RCA.  Subsequent diagnostic catheterization showed a 90% mid LAD stenosis, 90% proximal second diagonal stenosis, and occluded left circumflex and occluded RPA V with left to left and left to right collaterals.  The mid LAD was successfully intervened upon using a 3.0 x 38 mm Synergy XD drug-eluting stent.  He was subsequently placed on ranolazine for intermittent chest pain in early 2022, this was titrated with initial good response.  At December 2022 follow-up visit, he reported several week history of intermittent chest pain, somewhat different from prior angina.  There was some concern that perhaps symptoms were associated with  a recent trial of sertraline, which was subsequently discontinued.  An initial conservative approach was advised with plan for diagnostic catheterization for recurrent chest pain.  He was also advised to take furosemide 20 mg daily.  Labs returned that day with an LDL of 72 and he was advised to start Zetia 10 mg daily.  After starting Zetia, he noted some progression of  the frequency of his chest pain symptoms and therefore, he stopped it.  Symptoms improved for a few days but then returned.  He describes his chest pain is intermittently sharp but mostly more like a dull ache, typically occurring several times a day on varied locations across his chest with either rest or exertion, lasting just a few seconds, and resolving spontaneously.  Sometimes the pain is sharp and moves into his neck.  He notes that with exertion, he is likely to have a mild discomfort in the center of his chest sometimes associate with dyspnea, lasting up to about 15 minutes.  This type of discomfort typically resolves even though he continues to walk.  Overall, he is relatively sedentary.  Due to ongoing symptoms, he is interested in pursuing diagnostic catheterization.  He denies palpitations, PND, orthopnea, dizziness, syncope, edema, or early satiety.  Home Medications    Current Outpatient Medications  Medication Sig Dispense Refill   amLODipine (NORVASC) 5 MG tablet Take 1 tablet (5 mg total) by mouth daily. NO FURTHER REFILLS UNTIL SEEN IN CLINIC. PLEASE CALL OFFICE TO SCHEDULE APPOINTMENT. 90 tablet 0   aspirin EC 81 MG tablet Take 81 mg by mouth daily.      buPROPion (WELLBUTRIN XL) 150 MG 24 hr tablet Take 1 tablet (150 mg total) by mouth daily with breakfast. 60 tablet 3   clopidogrel (PLAVIX) 75 MG tablet TAKE 1 TABLET BY MOUTH DAILY. PLEASE CALL OFFICE TO SCHEDULE APPOINTMENT FOR FURTHER REFILLS. 90 tablet 0   cyclobenzaprine (FLEXERIL) 10 MG tablet TAKE 1 TABLET BY MOUTH AT BEDTIME AS NEEDED FOR MUSCLE SPASMS 90 tablet 1   DENTA 5000 PLUS 1.1 % CREA dental cream See admin instructions.     fluticasone (FLONASE) 50 MCG/ACT nasal spray Place 1 spray into both nostrils every evening.      furosemide (LASIX) 20 MG tablet Take 1 tablet (20 mg total) by mouth daily. 90 tablet 3   LORazepam (ATIVAN) 1 MG tablet TAKE 1 TABLET BY MOUTH DAILY AS NEEDED FOR ANXIETY 30 tablet 5   Multiple  Vitamin (MULTIVITAMIN WITH MINERALS) TABS tablet Take 1 tablet by mouth daily.     nitroGLYCERIN (NITROSTAT) 0.4 MG SL tablet PLACE 1 TABLET (0.4 MG TOTAL) UNDER THE TONGUE EVERY 5 (FIVE) MINUTES AS NEEDED FOR CHEST PAIN. 25 tablet 1   pantoprazole (PROTONIX) 40 MG tablet TAKE 1 TABLET (40 MG TOTAL) BY MOUTH DAILY. 30 MIN BEFORE BREAKFAST OR DINNER *STOP NEXIUM 90 tablet 3   ranolazine (RANEXA) 1000 MG SR tablet TAKE 1 TABLET BY MOUTH TWICE A DAY 180 tablet 3   rosuvastatin (CRESTOR) 40 MG tablet Take 1 tablet (40 mg total) by mouth daily. 90 tablet 3   tadalafil (CIALIS) 20 MG tablet 1 tab 1 hour prior to intercourse.  Do not take nitroglycerin within 36 hours of taking tadalafil 30 tablet 5   ezetimibe (ZETIA) 10 MG tablet Take 1 tablet (10 mg total) by mouth daily. (Patient not taking: Reported on 02/23/2021) 30 tablet 3   No current facility-administered medications for this visit.    Family History  Family History  Problem Relation Age of Onset   Diabetes Father    Heart disease Father        x2   Hyperlipidemia Father    Hypertension Father    Heart attack Father 46       x2; CABG x 3 11/2019   Heart disease Brother        MI in 30s   Heart attack Brother 63    Social History    Social History   Socioeconomic History   Marital status: Married    Spouse name: Not on file   Number of children: Not on file   Years of education: Not on file   Highest education level: Not on file  Occupational History   Occupation: medical coding     Comment: [employer]  Tobacco Use   Smoking status: Never   Smokeless tobacco: Never  Vaping Use   Vaping Use: Never used  Substance and Sexual Activity   Alcohol use: Yes    Comment: occas. 2-3 x per month   Drug use: Never   Sexual activity: Yes    Partners: Female  Other Topics Concern   Not on file  Social History Narrative   Moved from [place]    Divorced   Some college    Social Determinants of Health   Financial Resource  Strain: Not on file  Food Insecurity: Not on file  Transportation Needs: Not on file  Physical Activity: Not on file  Stress: Not on file  Social Connections: Not on file  Intimate Partner Violence: Not on file     Review of Systems    Ongoing intermittent, relatively fleeting, and focal chest discomfort across varied locations on his chest and back.  He is also had sharp pain in his neck, as well as substernal pressure.  He has had some dyspnea on exertion.  He denies palpitations, PND, orthopnea, dizziness, syncope, edema, or early satiety all other systems reviewed and are otherwise negative except as noted above.    Physical Exam    VS:  BP 110/72 (BP Location: Left Arm, Patient Position: Sitting, Cuff Size: Normal)    Pulse 78    Ht 5\' 7"  (1.702 m)    Wt 212 lb (96.2 kg)    SpO2 99%    BMI 33.20 kg/m  , BMI Body mass index is 33.2 kg/m.     GEN: Well nourished, well developed, in no acute distress. HEENT: normal. Neck: Supple, no JVD, carotid bruits, or masses. Cardiac: RRR, no murmurs, rubs, or gallops. No clubbing, cyanosis, edema.  Radials/DP/PT 2+ and equal bilaterally.  Normal Allen's test bilaterally. Respiratory:  Respirations regular and unlabored, clear to auscultation bilaterally. GI: Soft, nontender, nondistended, BS + x 4. MS: no deformity or atrophy. Skin: warm and dry, no rash. Neuro:  Strength and sensation are intact. Psych: Normal affect.  Accessory Clinical Findings    Lab Results  Component Value Date   WBC 5.7 02/01/2021   HGB 14.8 02/01/2021   HCT 43.7 02/01/2021   MCV 95 02/01/2021   PLT 227 02/01/2021   Lab Results  Component Value Date   CREATININE 1.00 02/01/2021   BUN 9 02/01/2021   NA 138 02/01/2021   K 4.4 02/01/2021   CL 101 02/01/2021   CO2 27 02/01/2021   Lab Results  Component Value Date   ALT 13 02/01/2021   AST 18 02/01/2021   ALKPHOS 71 02/01/2021   BILITOT 0.4 02/01/2021   Lab  Results  Component Value Date   CHOL 148  02/01/2021   HDL 61 02/01/2021   LDLCALC 72 02/01/2021   TRIG 75 02/01/2021   CHOLHDL 2.4 02/01/2021    Lab Results  Component Value Date   HGBA1C 4.9 01/26/2020    Assessment & Plan    1.  Precordial chest pain/CAD: Patient with a history of CAD status post mid LAD stenting in January 2021 with a total occlusion of the mid left circumflex and totaled RPAV, with left to left and left to right collaterals supplying those areas.  Patient has been experiencing relatively atypical, intermittent, dull and sharp, relatively focal discomfort in varied areas across his chest and back throughout the day with rest and exertion, sometimes associated with dyspnea, and sometimes moving into his neck.  He has also had heaviness in his chest when he walks which resolves despite ongoing walking.  Initially, it was felt that sertraline may have contributed the symptoms but symptoms have persisted despite discontinuation of sertraline.  Given ongoing symptoms and history of aggressive coronary artery disease, we have agreed to pursue diagnostic catheterization.  The patient understands that risks include but are not limited to stroke (1 in 1000), death (1 in 54), kidney failure [usually temporary] (1 in 500), bleeding (1 in 200), allergic reaction [possibly serious] (1 in 200), and agrees to proceed.  I will check a CBC and basic metabolic panel today.  Continue aspirin, Plavix, statin, ranolazine, and calcium channel blocker therapy.  He was recently placed on Zetia but noted worsening of symptoms and therefore discontinued.  2.  Hyperlipidemia: LDL of 72.  He is on rosuvastatin 40 mg and was recent placed on Zetia however, he felt that Zetia worsened his symptoms and discontinued.  We can consider PCSK9 inhibitor therapy in addition to lifestyle modifications following catheterization.  3.  Morbid obesity: BMI of 33.20.  Status post prior gastric sleeve.  Over the past 3 months, weight is up 19 pounds on our  scale.  Pending catheterization, may benefit from cardiac rehabilitation if eligible, or further lifestyle modifications and regular activity otherwise.  4.  Disposition: Follow-up CBC and basic metabolic panel today.  Plan for diagnostic catheterization next week.  Follow-up in clinic approximately 2 weeks post catheterization.   Murray Hodgkins, NP 02/23/2021, 8:45 AM

## 2021-03-03 ENCOUNTER — Encounter: Payer: Self-pay | Admitting: Internal Medicine

## 2021-03-03 ENCOUNTER — Encounter
Admission: RE | Disposition: A | Discharge status: Home / Self Care | Payer: 59 | Source: Home / Self Care | Attending: Internal Medicine

## 2021-03-03 ENCOUNTER — Observation Stay
Admission: RE | Admit: 2021-03-03 | Discharge: 2021-03-04 | Disposition: A | Discharge status: Home / Self Care | Payer: 59 | Attending: Internal Medicine | Admitting: Internal Medicine

## 2021-03-03 ENCOUNTER — Other Ambulatory Visit: Payer: Self-pay

## 2021-03-03 DIAGNOSIS — I2510 Atherosclerotic heart disease of native coronary artery without angina pectoris: Secondary | ICD-10-CM | POA: Diagnosis present

## 2021-03-03 DIAGNOSIS — E785 Hyperlipidemia, unspecified: Secondary | ICD-10-CM | POA: Diagnosis present

## 2021-03-03 DIAGNOSIS — Z20822 Contact with and (suspected) exposure to covid-19: Secondary | ICD-10-CM | POA: Diagnosis not present

## 2021-03-03 DIAGNOSIS — Z79899 Other long term (current) drug therapy: Secondary | ICD-10-CM | POA: Insufficient documentation

## 2021-03-03 DIAGNOSIS — R079 Chest pain, unspecified: Secondary | ICD-10-CM

## 2021-03-03 DIAGNOSIS — Z9884 Bariatric surgery status: Secondary | ICD-10-CM | POA: Insufficient documentation

## 2021-03-03 DIAGNOSIS — I25110 Atherosclerotic heart disease of native coronary artery with unstable angina pectoris: Secondary | ICD-10-CM | POA: Diagnosis not present

## 2021-03-03 DIAGNOSIS — Z6833 Body mass index (BMI) 33.0-33.9, adult: Secondary | ICD-10-CM | POA: Insufficient documentation

## 2021-03-03 DIAGNOSIS — E66811 Obesity, class 1: Secondary | ICD-10-CM | POA: Diagnosis present

## 2021-03-03 DIAGNOSIS — I25118 Atherosclerotic heart disease of native coronary artery with other forms of angina pectoris: Secondary | ICD-10-CM | POA: Diagnosis not present

## 2021-03-03 DIAGNOSIS — Z7982 Long term (current) use of aspirin: Secondary | ICD-10-CM | POA: Diagnosis not present

## 2021-03-03 DIAGNOSIS — I200 Unstable angina: Secondary | ICD-10-CM | POA: Diagnosis present

## 2021-03-03 DIAGNOSIS — E669 Obesity, unspecified: Secondary | ICD-10-CM | POA: Diagnosis present

## 2021-03-03 DIAGNOSIS — I25119 Atherosclerotic heart disease of native coronary artery with unspecified angina pectoris: Secondary | ICD-10-CM | POA: Diagnosis present

## 2021-03-03 HISTORY — PX: LEFT HEART CATH AND CORONARY ANGIOGRAPHY: CATH118249

## 2021-03-03 HISTORY — PX: CORONARY STENT INTERVENTION: CATH118234

## 2021-03-03 LAB — RESP PANEL BY RT-PCR (FLU A&B, COVID) ARPGX2
Influenza A by PCR: NEGATIVE
Influenza B by PCR: NEGATIVE
SARS Coronavirus 2 by RT PCR: NEGATIVE

## 2021-03-03 LAB — POCT ACTIVATED CLOTTING TIME
Activated Clotting Time: 275 seconds
Activated Clotting Time: 287 seconds

## 2021-03-03 SURGERY — LEFT HEART CATH AND CORONARY ANGIOGRAPHY
Anesthesia: Moderate Sedation

## 2021-03-03 MED ORDER — SODIUM CHLORIDE 0.9% FLUSH: 3.0000 mL | INTRAVENOUS | Status: DC | PRN

## 2021-03-03 MED ORDER — HYDRALAZINE HCL 20 MG/ML IJ SOLN: 10.0000 mg | INTRAMUSCULAR | Status: AC | PRN

## 2021-03-03 MED ORDER — HEPARIN SODIUM (PORCINE) 1000 UNIT/ML IJ SOLN
INTRAMUSCULAR | Status: AC
  Filled 2021-03-03: qty 10

## 2021-03-03 MED ORDER — VERAPAMIL HCL 2.5 MG/ML IV SOLN
INTRAVENOUS | Status: DC | PRN
  Administered 2021-03-03: 2.5 mg via INTRAVENOUS

## 2021-03-03 MED ORDER — LIDOCAINE HCL 1 % IJ SOLN
INTRAMUSCULAR | Status: AC
  Filled 2021-03-03: qty 20

## 2021-03-03 MED ORDER — SODIUM CHLORIDE 0.9% FLUSH
3.0000 mL | Freq: Two times a day (BID) | INTRAVENOUS | Status: DC
  Administered 2021-03-03 (×2): 3 mL via INTRAVENOUS

## 2021-03-03 MED ORDER — CLOPIDOGREL BISULFATE 75 MG PO TABS
75.0000 mg | ORAL_TABLET | Freq: Every day | ORAL | Status: DC
  Administered 2021-03-04: 75 mg via ORAL
  Filled 2021-03-03: qty 1

## 2021-03-03 MED ORDER — CALCIUM CARBONATE ANTACID 500 MG PO CHEW
1.0000 | CHEWABLE_TABLET | Freq: Three times a day (TID) | ORAL | Status: DC | PRN
  Administered 2021-03-03: 200 mg via ORAL
  Filled 2021-03-03: qty 1

## 2021-03-03 MED ORDER — VERAPAMIL HCL 2.5 MG/ML IV SOLN
INTRAVENOUS | Status: AC
  Filled 2021-03-03: qty 2

## 2021-03-03 MED ORDER — AMLODIPINE BESYLATE 5 MG PO TABS
5.0000 mg | ORAL_TABLET | Freq: Every day | ORAL | Status: DC
  Administered 2021-03-04: 5 mg via ORAL
  Filled 2021-03-03: qty 1

## 2021-03-03 MED ORDER — LIDOCAINE HCL (PF) 1 % IJ SOLN
INTRAMUSCULAR | Status: DC | PRN
  Administered 2021-03-03: 2 mL

## 2021-03-03 MED ORDER — NITROGLYCERIN 1 MG/10 ML FOR IR/CATH LAB
INTRA_ARTERIAL | Status: DC | PRN
  Administered 2021-03-03: 200 ug via INTRACORONARY

## 2021-03-03 MED ORDER — FENTANYL CITRATE (PF) 100 MCG/2ML IJ SOLN
INTRAMUSCULAR | Status: AC
  Filled 2021-03-03: qty 2

## 2021-03-03 MED ORDER — RANOLAZINE ER 500 MG PO TB12
1000.0000 mg | ORAL_TABLET | Freq: Two times a day (BID) | ORAL | Status: DC
  Administered 2021-03-03 – 2021-03-04 (×2): 1000 mg via ORAL
  Filled 2021-03-03 (×3): qty 2

## 2021-03-03 MED ORDER — FUROSEMIDE 10 MG/ML IJ SOLN
20.0000 mg | Freq: Once | INTRAMUSCULAR | Status: AC
  Administered 2021-03-03: 20 mg via INTRAVENOUS
  Filled 2021-03-03: qty 2

## 2021-03-03 MED ORDER — SODIUM CHLORIDE 0.9% FLUSH
3.0000 mL | Freq: Two times a day (BID) | INTRAVENOUS | Status: DC
  Administered 2021-03-03: 3 mL via INTRAVENOUS

## 2021-03-03 MED ORDER — HEPARIN (PORCINE) IN NACL 1000-0.9 UT/500ML-% IV SOLN
INTRAVENOUS | Status: DC | PRN
  Administered 2021-03-03 (×2): 500 mL

## 2021-03-03 MED ORDER — CLOPIDOGREL BISULFATE 75 MG PO TABS
ORAL_TABLET | ORAL | Status: DC | PRN
  Administered 2021-03-03: 300 mg via ORAL

## 2021-03-03 MED ORDER — SODIUM CHLORIDE 0.9 % WEIGHT BASED INFUSION
3.0000 mL/kg/h | INTRAVENOUS | Status: DC
  Administered 2021-03-03: 3 mL/kg/h via INTRAVENOUS

## 2021-03-03 MED ORDER — ASPIRIN 81 MG PO CHEW: 81.0000 mg | CHEWABLE_TABLET | ORAL | Status: DC

## 2021-03-03 MED ORDER — HEPARIN (PORCINE) IN NACL 1000-0.9 UT/500ML-% IV SOLN
INTRAVENOUS | Status: AC
  Filled 2021-03-03: qty 1000

## 2021-03-03 MED ORDER — CYCLOBENZAPRINE HCL 10 MG PO TABS
10.0000 mg | ORAL_TABLET | Freq: Every evening | ORAL | Status: DC | PRN
  Filled 2021-03-03: qty 1

## 2021-03-03 MED ORDER — SODIUM CHLORIDE 0.9 % IV SOLN: 250.0000 mL | INTRAVENOUS | Status: DC | PRN

## 2021-03-03 MED ORDER — NITROGLYCERIN 0.4 MG SL SUBL: 0.4000 mg | SUBLINGUAL_TABLET | SUBLINGUAL | Status: DC | PRN

## 2021-03-03 MED ORDER — BUPROPION HCL ER (XL) 150 MG PO TB24
150.0000 mg | ORAL_TABLET | Freq: Every day | ORAL | Status: DC
  Administered 2021-03-04: 150 mg via ORAL
  Filled 2021-03-03: qty 1

## 2021-03-03 MED ORDER — HEPARIN SODIUM (PORCINE) 1000 UNIT/ML IJ SOLN
INTRAMUSCULAR | Status: DC | PRN
  Administered 2021-03-03: 5000 [IU] via INTRAVENOUS
  Administered 2021-03-03: 3000 [IU] via INTRAVENOUS
  Administered 2021-03-03: 5000 [IU] via INTRAVENOUS

## 2021-03-03 MED ORDER — ACETAMINOPHEN 325 MG PO TABS: 650.0000 mg | ORAL_TABLET | ORAL | Status: DC | PRN

## 2021-03-03 MED ORDER — IOHEXOL 300 MG/ML  SOLN
INTRAMUSCULAR | Status: DC | PRN
  Administered 2021-03-03: 87 mL

## 2021-03-03 MED ORDER — MIDAZOLAM HCL 2 MG/2ML IJ SOLN
INTRAMUSCULAR | Status: DC | PRN
  Administered 2021-03-03 (×2): 1 mg via INTRAVENOUS

## 2021-03-03 MED ORDER — MIDAZOLAM HCL 2 MG/2ML IJ SOLN
INTRAMUSCULAR | Status: AC
  Filled 2021-03-03: qty 2

## 2021-03-03 MED ORDER — FLUTICASONE PROPIONATE 50 MCG/ACT NA SUSP
1.0000 | Freq: Two times a day (BID) | NASAL | Status: DC
  Administered 2021-03-03 – 2021-03-04 (×2): 1 via NASAL
  Filled 2021-03-03 (×2): qty 16

## 2021-03-03 MED ORDER — FENTANYL CITRATE (PF) 100 MCG/2ML IJ SOLN
INTRAMUSCULAR | Status: DC | PRN
  Administered 2021-03-03 (×2): 25 ug via INTRAVENOUS

## 2021-03-03 MED ORDER — ROSUVASTATIN CALCIUM 20 MG PO TABS
40.0000 mg | ORAL_TABLET | Freq: Every day | ORAL | Status: DC
  Administered 2021-03-03: 40 mg via ORAL
  Filled 2021-03-03 (×2): qty 2

## 2021-03-03 MED ORDER — CLOPIDOGREL BISULFATE 75 MG PO TABS
ORAL_TABLET | ORAL | Status: AC
  Filled 2021-03-03: qty 4

## 2021-03-03 MED ORDER — ONDANSETRON HCL 4 MG/2ML IJ SOLN: 4.0000 mg | Freq: Four times a day (QID) | INTRAMUSCULAR | Status: DC | PRN

## 2021-03-03 MED ORDER — SODIUM CHLORIDE 0.9 % WEIGHT BASED INFUSION: 1.0000 mL/kg/h | INTRAVENOUS | Status: DC

## 2021-03-03 MED ORDER — LABETALOL HCL 5 MG/ML IV SOLN: 10.0000 mg | INTRAVENOUS | Status: AC | PRN

## 2021-03-03 MED ORDER — SODIUM CHLORIDE 0.9 % IV SOLN: INTRAVENOUS | Status: DC

## 2021-03-03 MED ORDER — FUROSEMIDE 20 MG PO TABS
20.0000 mg | ORAL_TABLET | Freq: Every day | ORAL | Status: DC
  Administered 2021-03-04: 20 mg via ORAL
  Filled 2021-03-03: qty 1

## 2021-03-03 MED ORDER — ASPIRIN EC 81 MG PO TBEC
81.0000 mg | DELAYED_RELEASE_TABLET | Freq: Every day | ORAL | Status: DC
  Administered 2021-03-04: 81 mg via ORAL
  Filled 2021-03-03: qty 1

## 2021-03-03 MED ORDER — NITROGLYCERIN 1 MG/10 ML FOR IR/CATH LAB
INTRA_ARTERIAL | Status: AC
  Filled 2021-03-03: qty 10

## 2021-03-03 MED ORDER — EZETIMIBE 10 MG PO TABS
10.0000 mg | ORAL_TABLET | Freq: Every day | ORAL | Status: DC
  Administered 2021-03-03 – 2021-03-04 (×2): 10 mg via ORAL
  Filled 2021-03-03 (×2): qty 1

## 2021-03-03 MED ORDER — PANTOPRAZOLE SODIUM 40 MG PO TBEC
40.0000 mg | DELAYED_RELEASE_TABLET | Freq: Every day | ORAL | Status: DC
  Administered 2021-03-03: 40 mg via ORAL
  Filled 2021-03-03: qty 1

## 2021-03-03 SURGICAL SUPPLY — 19 items
BALLN TREK RX 2.25X8 (BALLOONS) ×3
BALLN ~~LOC~~ TREK RX 3.25X12 (BALLOONS) ×3
BALLOON TREK RX 2.25X8 (BALLOONS) IMPLANT
BALLOON ~~LOC~~ TREK RX 3.25X12 (BALLOONS) IMPLANT
CATH 5F 110X4 TIG (CATHETERS) ×1 IMPLANT
CATH 5FR JR4 DIAGNOSTIC (CATHETERS) ×1 IMPLANT
CATH LAUNCHER 6FR JR4 (CATHETERS) ×1 IMPLANT
DEVICE RAD TR BAND REGULAR (VASCULAR PRODUCTS) ×1 IMPLANT
DRAPE BRACHIAL (DRAPES) ×1 IMPLANT
GLIDESHEATH SLEND SS 6F .021 (SHEATH) ×1 IMPLANT
GUIDEWIRE INQWIRE 1.5J.035X260 (WIRE) IMPLANT
INQWIRE 1.5J .035X260CM (WIRE) ×3
KIT ENCORE 26 ADVANTAGE (KITS) ×1 IMPLANT
PACK CARDIAC CATH (CUSTOM PROCEDURE TRAY) ×3 IMPLANT
PROTECTION STATION PRESSURIZED (MISCELLANEOUS) ×3
SET ATX SIMPLICITY (MISCELLANEOUS) ×1 IMPLANT
STATION PROTECTION PRESSURIZED (MISCELLANEOUS) IMPLANT
STENT ONYX FRONTIER 3.0X18 (Permanent Stent) ×1 IMPLANT
WIRE RUNTHROUGH .014X180CM (WIRE) ×1 IMPLANT

## 2021-03-03 NOTE — Progress Notes (Signed)
TR band off: scanty ooze at radial site. Manual pressure held x 5 min. Sterile 2x2 and Tegaderm applied. Pt. Tolerated well.

## 2021-03-03 NOTE — Interval H&P Note (Signed)
History and Physical Interval Note:  03/03/2021 9:14 AM  Marcus Roberts  has presented today for surgery, with the diagnosis of coronary artery disease with accelerating angina.  The various methods of treatment have been discussed with the patient and family. After consideration of risks, benefits and other options for treatment, the patient has consented to  Procedure(s): LEFT HEART CATH AND CORONARY ANGIOGRAPHY (Left) as a surgical intervention.  The patient's history has been reviewed, patient examined, no change in status, stable for surgery.  I have reviewed the patient's chart and labs.  Questions were answered to the patient's satisfaction.    Cath Lab Visit (complete for each Cath Lab visit)  Clinical Evaluation Leading to the Procedure:   ACS: No.  Non-ACS:    Anginal Classification: CCS IV  Anti-ischemic medical therapy: Maximal Therapy (2 or more classes of medications)  Non-Invasive Test Results: No non-invasive testing performed  Prior CABG: No previous CABG  Khaleb Broz

## 2021-03-03 NOTE — Progress Notes (Signed)
[facility]  Date: 03/03/21 Time: 3:19 PM  Cath results reviewed again with Mr. Grzelak.  He feels well but is concerned about progression of RCA disease.  We discussed importance of aggressive lipid control.  He now wonders if neck/facial pain he was feeling recently with addition of ezetimibe may have been angina from CAD.  We have agreed to rechallenge him with ezetimibe 10 mg daily.  Continue rosuvastatin 40 mg daily.  Nelva Bush, MD [facility]

## 2021-03-04 DIAGNOSIS — I25110 Atherosclerotic heart disease of native coronary artery with unstable angina pectoris: Secondary | ICD-10-CM | POA: Diagnosis not present

## 2021-03-04 DIAGNOSIS — I25118 Atherosclerotic heart disease of native coronary artery with other forms of angina pectoris: Secondary | ICD-10-CM | POA: Diagnosis not present

## 2021-03-04 LAB — BASIC METABOLIC PANEL
Anion gap: 8 (ref 5–15)
BUN: 8 mg/dL (ref 6–20)
CO2: 28 mmol/L (ref 22–32)
Calcium: 9 mg/dL (ref 8.9–10.3)
Chloride: 104 mmol/L (ref 98–111)
Creatinine, Ser: 0.99 mg/dL (ref 0.61–1.24)
GFR, Estimated: >60 mL/min (ref >60)
Glucose, Bld: 105 mg/dL — ABNORMAL HIGH (ref 70–99)
Potassium: 3.6 mmol/L (ref 3.5–5.1)
Sodium: 140 mmol/L (ref 135–145)

## 2021-03-04 LAB — CBC
HCT: 38.7 % — ABNORMAL LOW (ref 39.0–52.0)
Hemoglobin: 13.3 g/dL (ref 13.0–17.0)
MCH: 31.5 pg (ref 26.0–34.0)
MCHC: 34.4 g/dL (ref 30.0–36.0)
MCV: 91.7 fL (ref 80.0–100.0)
Platelets: 198 10*3/uL (ref 150–400)
RBC: 4.22 MIL/uL (ref 4.22–5.81)
RDW: 12.2 % (ref 11.5–15.5)
WBC: 6.8 10*3/uL (ref 4.0–10.5)
nRBC: 0 % (ref 0.0–0.2)

## 2021-03-04 MED ORDER — EZETIMIBE 10 MG PO TABS: 10.0000 mg | ORAL_TABLET | Freq: Every day | ORAL | 3 refills | Status: DC

## 2021-03-04 NOTE — Discharge Summary (Addendum)
Discharge Summary    Patient ID: Marcus Roberts MRN: [id number]; DOB: Mar 19, 1975  Admit date: 03/03/2021 Discharge date: 03/04/2021  PCP:  McLean-Scocuzza, Nino Glow, MD   [facility] Providers Cardiologist:  Nelva Bush, MD   {  Discharge Diagnoses    Principal Problem:   Accelerating angina [facility]) Active Problems:   Obesity (BMI 30.0-34.9)   Hyperlipidemia LDL goal <70   Coronary artery disease involving native coronary artery of native heart with angina pectoris [facility])   Diagnostic Studies/Procedures    LHC 03/03/21 Conclusions: Multivessel coronary artery disease, as detailed below, including chronic total occlusions of the RPAV and mid LCx.  There is a 99% stenosis in the mid/distal RCA with TIMI-1 flow.  60% distal RCA stenosis (previously borderline significant by FFR) appears unchanged. Widely patent proximal/mid LAD stent. Normal left ventricular contraction with moderately elevated filling pressure consistent with diastolic heart failure. Successful PCI to 99% mid/distal RCA stenosis using Onyx Frontier 3.0 x 18 mm drug-eluting stent (postdilated to 3.3 mm) with 0% residual stenosis and TIMI-3 flow.   Recommendations: Overnight extended recovery. Continue long-term dual antiplatelet therapy with aspirin and clopidogrel. Continue antianginal therapy for distal RCA disease as well as chronic total occlusions of the LCx and RPAV. Aggressive secondary prevention.  Consider addition of bempedoic acid versus PCSK9 inhibitor as an outpatient to target LDL less than 70 (ideally below 50).   Nelva Bush, MD [facility]  Antiplatelet/Anticoag Recommend dual antiplatelet therapy with Aspirin 81mg  daily and Clopidogrel 75mg  daily.  Discharge Date In the absence of any other complications or medical issues, we expect the patient to be ready for discharge from an interventional cardiology perspective on 03/04/2021.   Coronary Diagrams  Diagnostic Dominance:  Right Intervention    _____________   History of Present Illness     Marcus Roberts is a 46 y.o. male with h/o CAD, HLD, GERD, obesity s/p gastric sleeve, depression, nephrolithiasis who presented for elective cardiac catheterization.   November 2020 he was evaluated secondary to intermittent chest tightness and burning.  Exercise treadmill showed abnormal results with 2 mm horizontal ST segment depression in leads V4 through V6.  He subsequently underwent coronary CT angiography which showed calcium score of 923, and greater than 70% proximal mid stenosis within the LAD and also in the distal RCA.  Subsequent diagnostic catheterization showed 90% mid LAD stenosis, 90% proximal second diagonal stenosis, and occluded left circumflex and occluded RPA V with left to left and left to right collaterals.  The mid LAD was successfully intervened upon with DES.  He was subsequently placed on ranolazine for intermittent chest pain in early 2022, this was titrated with good response.  History of negative side effects on Zetia.  He was seen in the office 02/23/2021 for progressive angina.  He was ultimately set up for a left cardiac catheterization  Hospital Course     Consultants: none  Patient arrived for his scheduled cardiac catheterization 03/03/2021.  Cath showed multivessel coronary artery disease including chronic total occlusion of the RPA V and mid circumflex.  There was a 99% stenosis in the mid to distal RCA with TIMI flow 1.  60% distal RCA stenosis previously borderline significant by FFR, appeared unchanged.  Widely patent proximal/mid LAD stent.  Normal LV function with moderately elevated filling pressures consistent with diastolic heart failure.  Patient was treated with successful PCI to 99% mid to distal RCA stenosis with DES.  Patient was kept overnight for extended recovery.  Patient was  continued on long-term dual antiplatelet therapy with aspirin and Plavix.  Patient was restarted on  Zetia with plan for possible PCSK9 1 inhibitors in the outpatient setting.  Patient remained stable overnight.  No further chest pain.  Right radial cath site remained stable.  Follow-up labs normal.  Patient ambulated without chest pain or shortness of breath.  We will plan to send patient home on DAPT with aspirin and Plavix.  We will continue Crestor and add on Zetia.  We will continue Lasix 20 mg daily with bimah in a week.  Continue PTA Ranexa and amlodipine.  The patient was evaluated by Dr. Caryl Comes and felt to be stable for discharge.   GEN: Well nourished, well developed, in no acute distress. HEENT: normal. Neck: Supple, no JVD, carotid bruits, or masses. Cardiac: RRR, no murmurs, rubs, or gallops. No clubbing, cyanosis, edema.  Radials/DP/PT 2+ and equal bilaterally.  Normal Allen's test bilaterally. Respiratory:  Respirations regular and unlabored, clear to auscultation bilaterally. GI: Soft, nontender, nondistended, BS + x 4. MS: no deformity or atrophy. Skin: warm and dry, no rash. Neuro:  Strength and sensation are intact. Psych: Normal affect.  Did the patient have an acute coronary syndrome (MI, NSTEMI, STEMI, etc) this admission?:  No                               Did the patient have a percutaneous coronary intervention (stent / angioplasty)?:  Yes.     Cath/PCI Registry Performance & Quality Measures: Aspirin prescribed? - Yes ADP Receptor Inhibitor (Plavix/Clopidogrel, Brilinta/Ticagrelor or Effient/Prasugrel) prescribed (includes medically managed patients)? - Yes High Intensity Statin (Lipitor 40-80mg  or Crestor 20-40mg ) prescribed? - Yes For EF <40%, was ACEI/ARB prescribed? - Not Applicable (EF >/= 43%) For EF <40%, Aldosterone Antagonist (Spironolactone or Eplerenone) prescribed? - Not Applicable (EF >/= 15%) Cardiac Rehab Phase II ordered? - Yes       The patient will be scheduled for a TOC follow up appointment in 14 days.  A message has been sent to the [facility]  and Scheduling Pool at the office where the patient should be seen for follow up.  _____________  Discharge Vitals Blood pressure 108/74, pulse 67, temperature 98.7 F (37.1 C), temperature source Oral, resp. rate 19, height 5\' 7"  (1.702 m), weight 95.8 kg, SpO2 98 %.  Filed Weights   03/03/21 0738 03/03/21 1515  Weight: 96.2 kg 95.8 kg    Labs & Radiologic Studies    CBC Recent Labs    03/04/21 0401  WBC 6.8  HGB 13.3  HCT 38.7*  MCV 91.7  PLT 400   Basic Metabolic Panel Recent Labs    03/04/21 0401  NA 140  K 3.6  CL 104  CO2 28  GLUCOSE 105*  BUN 8  CREATININE 0.99  CALCIUM 9.0   Liver Function Tests No results for input(s): AST, ALT, ALKPHOS, BILITOT, PROT, ALBUMIN in the last 72 hours. No results for input(s): LIPASE, AMYLASE in the last 72 hours. High Sensitivity Troponin:   No results for input(s): TROPONINIHS in the last 720 hours.  BNP Invalid input(s): POCBNP D-Dimer No results for input(s): DDIMER in the last 72 hours. Hemoglobin A1C No results for input(s): HGBA1C in the last 72 hours. Fasting Lipid Panel No results for input(s): CHOL, HDL, LDLCALC, TRIG, CHOLHDL, LDLDIRECT in the last 72 hours. Thyroid Function Tests No results for input(s): TSH, T4TOTAL, T3FREE, THYROIDAB in the last 72  hours.  Invalid input(s): FREET3 _____________  CARDIAC CATHETERIZATION  Result Date: 03/03/2021 Conclusions: Multivessel coronary artery disease, as detailed below, including chronic total occlusions of the RPAV and mid LCx.  There is a 99% stenosis in the mid/distal RCA with TIMI-1 flow.  60% distal RCA stenosis (previously borderline significant by FFR) appears unchanged. Widely patent proximal/mid LAD stent. Normal left ventricular contraction with moderately elevated filling pressure consistent with diastolic heart failure. Successful PCI to 99% mid/distal RCA stenosis using Onyx Frontier 3.0 x 18 mm drug-eluting stent (postdilated to 3.3 mm) with 0% residual  stenosis and TIMI-3 flow. Recommendations: Overnight extended recovery. Continue long-term dual antiplatelet therapy with aspirin and clopidogrel. Continue antianginal therapy for distal RCA disease as well as chronic total occlusions of the LCx and RPAV. Aggressive secondary prevention.  Consider addition of bempedoic acid versus PCSK9 inhibitor as an outpatient to target LDL less than 70 (ideally below 50). Nelva Bush, MD [facility]  Disposition   Pt is being discharged home today in good condition.  Follow-up Plans & Appointments     Follow-up Information     [facility]. Call in 2 day(s).   Specialty: Cardiology Why: Office should call to schedule apt within 14 days. Contact information: [address], [place] [phone]               Discharge Instructions     AMB Referral to Cardiac Rehabilitation - Phase II   Complete by: As directed    Diagnosis: Coronary Stents   After initial evaluation and assessments completed: Virtual Based Care may be provided alone or in conjunction with Phase 2 Cardiac Rehab based on patient barriers.: Yes       Discharge Medications   Allergies as of 03/04/2021       Reactions   Imdur [isosorbide Nitrate]    Did not tolerate due to headache   Zetia [ezetimibe] Other (See Comments)   Head pressure/headaches Chest discomfort        Medication List     TAKE these medications    amLODipine 5 MG tablet Commonly known as: NORVASC Take 1 tablet (5 mg total) by mouth daily. NO FURTHER REFILLS UNTIL SEEN IN CLINIC. PLEASE CALL OFFICE TO SCHEDULE APPOINTMENT.   aspirin EC 81 MG tablet Take 81 mg by mouth daily.   Biotin 10000 MCG Tabs Take 10,000 mcg by mouth in the morning.   buPROPion 150 MG 24 hr tablet Commonly known as: WELLBUTRIN XL Take 1 tablet (150 mg total) by mouth daily with breakfast.   clopidogrel 75 MG tablet Commonly known as: PLAVIX TAKE 1  TABLET BY MOUTH DAILY. PLEASE CALL OFFICE TO SCHEDULE APPOINTMENT FOR FURTHER REFILLS.   cyclobenzaprine 10 MG tablet Commonly known as: FLEXERIL TAKE 1 TABLET BY MOUTH AT BEDTIME AS NEEDED FOR MUSCLE SPASMS   ezetimibe 10 MG tablet Commonly known as: ZETIA Take 1 tablet (10 mg total) by mouth daily.   fluticasone 50 MCG/ACT nasal spray Commonly known as: FLONASE Place 1 spray into both nostrils in the morning and at bedtime.   furosemide 20 MG tablet Commonly known as: LASIX Take 1 tablet (20 mg total) by mouth daily.   LORazepam 1 MG tablet Commonly known as: ATIVAN TAKE 1 TABLET BY MOUTH DAILY AS NEEDED FOR ANXIETY   multivitamin with minerals Tabs tablet Take 1 tablet by mouth 2 (two) times a week.   nitroGLYCERIN 0.4 MG SL tablet Commonly known as: NITROSTAT PLACE 1  TABLET (0.4 MG TOTAL) UNDER THE TONGUE EVERY 5 (FIVE) MINUTES AS NEEDED FOR CHEST PAIN.   pantoprazole 40 MG tablet Commonly known as: PROTONIX TAKE 1 TABLET (40 MG TOTAL) BY MOUTH DAILY. 21 MIN BEFORE BREAKFAST OR DINNER *STOP [place] What changed: See the new instructions.   ranolazine 1000 MG SR tablet Commonly known as: RANEXA TAKE 1 TABLET BY MOUTH TWICE A DAY   rosuvastatin 40 MG tablet Commonly known as: CRESTOR Take 1 tablet (40 mg total) by mouth daily. What changed: when to take this   tadalafil 20 MG tablet Commonly known as: CIALIS 1 tab 1 hour prior to intercourse.  Do not take nitroglycerin within 36 hours of taking tadalafil   tetrahydrozoline 0.05 % ophthalmic solution Place 1 drop into both eyes daily.   Vitamin D3 125 MCG (5000 UT) Tabs Take 5,000 Units by mouth in the morning.          Outstanding Labs/Studies   BMET in a week  Duration of Discharge Encounter   Greater than 30 minutes including physician time.  Signed, Cadence Ninfa Meeker, PA-C 03/04/2021, 8:35 AM  Patient seen and examined and results reviewed.  Based on recent data from [place] I am quite optimistic  that the addition of ezetimibe to his rosuvastatin will be able to successfully get his LDL to target.  Not a smoker.

## 2021-03-06 ENCOUNTER — Encounter: Payer: Self-pay | Admitting: Internal Medicine

## 2021-03-06 ENCOUNTER — Telehealth: Payer: Self-pay | Admitting: *Deleted

## 2021-03-06 DIAGNOSIS — I5030 Unspecified diastolic (congestive) heart failure: Secondary | ICD-10-CM

## 2021-03-06 NOTE — Telephone Encounter (Signed)
-----   Message from [place], PA-C sent at 03/04/2021  8:35 AM EST ----- Regarding: hosp follow-up Pt needs hospital follow up with in 14 days if possible. Also needs BMET in a week for HFpEF. Please call and arrange.

## 2021-03-06 NOTE — Telephone Encounter (Signed)
BMP order placed. Routing to scheduling to arrange for follow BMP in 1 week and 2 week follow up clinic.

## 2021-03-20 ENCOUNTER — Encounter: Payer: Self-pay | Admitting: Nurse Practitioner

## 2021-03-20 ENCOUNTER — Ambulatory Visit: Payer: 59 | Admitting: Nurse Practitioner

## 2021-03-20 ENCOUNTER — Other Ambulatory Visit: Payer: Self-pay

## 2021-03-20 VITALS — BP 116/74 | HR 76 | Ht 67.0 in | Wt 215.0 lb

## 2021-03-20 DIAGNOSIS — I25118 Atherosclerotic heart disease of native coronary artery with other forms of angina pectoris: Secondary | ICD-10-CM

## 2021-03-20 DIAGNOSIS — E785 Hyperlipidemia, unspecified: Secondary | ICD-10-CM

## 2021-03-20 MED ORDER — NITROGLYCERIN 0.4 MG SL SUBL: 0.4000 mg | SUBLINGUAL_TABLET | SUBLINGUAL | 1 refills | Status: DC | PRN

## 2021-03-20 NOTE — Patient Instructions (Signed)
Medication Instructions:  Your physician recommends that you continue on your current medications as directed. Please refer to the Current Medication list given to you today.  *If you need a refill on your cardiac medications before your next appointment, please call your pharmacy*   Lab Work: Your physician recommends that you return for a FASTING lipid profile and lft: 4 weeks  If you have labs (blood work) drawn today and your tests are completely normal, you will receive your results only by: [place] (if you have MyChart) OR A paper copy in the mail If you have any lab test that is abnormal or we need to change your treatment, we will call you to review the results.   Testing/Procedures: None ordered   Follow-Up: At [facility], you and your health needs are our priority.  As part of our continuing mission to provide you with exceptional heart care, we have created designated Provider Care Teams.  These Care Teams include your primary Cardiologist (physician) and Advanced Practice Providers (APPs -  Physician Assistants and Nurse Practitioners) who all work together to provide you with the care you need, when you need it.  We recommend signing up for the patient portal called "MyChart".  Sign up information is provided on this After Visit Summary.  MyChart is used to connect with patients for Virtual Visits (Telemedicine).  Patients are able to view lab/test results, encounter notes, upcoming appointments, etc.  Non-urgent messages can be sent to your provider as well.   To learn more about what you can do with MyChart, go to [email or web address].    Your next appointment:   2 month(s)  The format for your next appointment:   In Person  Provider:   You may see Nelva Bush, MD or one of the following Advanced Practice Providers on your designated Care Team:   Murray Hodgkins, NP    Other Instructions N/A

## 2021-03-20 NOTE — Progress Notes (Signed)
Office Visit    Patient Name: Marcus Roberts Date of Encounter: 03/20/2021  Primary Care Provider:  McLean-Scocuzza, Nino Glow, MD Primary Cardiologist:  Nelva Bush, MD  Chief Complaint    46 y/o ? w/ a h/o CAD, HL, GERD, obesity status post gastric sleeve, depression, and nephrolithiasis, who presents for follow-up after recent PCI of the right coronary artery.  Past Medical History    Past Medical History:  Diagnosis Date   Anxiety    Aortic atherosclerosis (HCC)    CAD (coronary artery disease)    a. 12/2018 ETT: Ex time 9:39. 75mm horiz ST dep in V4-V6 with abnormal coronary CTA. b. 02/2019 PCI: LM nl, LAD 60p/m, 36m (3.0x38 Synergy XD DES), 40d, D2 90p, LCX 147m, OM2 filled via collats from OM1, RCA 51m, 60d (FFR 0.8), RPDA 40, RPAV 100 CTO (fills via collats from [place]). EF 55%.   Chest pain    Depression    GERD (gastroesophageal reflux disease)    History of echocardiogram    a. 12/2018 Echo: EF 60-65%, no rwma. Mildly dil LA.   Hyperlipidemia    Kidney stones    Past Surgical History:  Procedure Laterality Date   CHOLECYSTECTOMY     CORONARY ATHERECTOMY N/A 02/13/2019   Procedure: CORONARY ATHERECTOMY;  Surgeon: Nelva Bush, MD;  Location: [facility];  Service: Cardiovascular;  Laterality: N/A;   CORONARY STENT INTERVENTION N/A 02/13/2019   Procedure: CORONARY STENT INTERVENTION;  Surgeon: Nelva Bush, MD;  Location: [facility];  Service: Cardiovascular;  Laterality: N/A;   CORONARY STENT INTERVENTION N/A 03/03/2021   Procedure: CORONARY STENT INTERVENTION;  Surgeon: Nelva Bush, MD;  Location: [facility];  Service: Cardiovascular;  Laterality: N/A;   INTRAVASCULAR PRESSURE WIRE/FFR STUDY N/A 02/13/2019   Procedure: INTRAVASCULAR PRESSURE WIRE/FFR STUDY;  Surgeon: Nelva Bush, MD;  Location: [facility];  Service: Cardiovascular;  Laterality: N/A;   INTRAVASCULAR ULTRASOUND/IVUS N/A 02/13/2019   Procedure: Intravascular  Ultrasound/IVUS;  Surgeon: Nelva Bush, MD;  Location: [facility];  Service: Cardiovascular;  Laterality: N/A;   LEFT HEART CATH AND CORONARY ANGIOGRAPHY N/A 02/13/2019   Procedure: LEFT HEART CATH AND CORONARY ANGIOGRAPHY;  Surgeon: Nelva Bush, MD;  Location: [facility];  Service: Cardiovascular;  Laterality: N/A;   LEFT HEART CATH AND CORONARY ANGIOGRAPHY Left 03/03/2021   Procedure: LEFT HEART CATH AND CORONARY ANGIOGRAPHY;  Surgeon: Nelva Bush, MD;  Location: [facility];  Service: Cardiovascular;  Laterality: Left;   vertical sleeve gastrectomy     2013 85% stomach removed was 280 lbs before surgery     Allergies  Allergies  Allergen Reactions   Imdur [Isosorbide Nitrate]     Did not tolerate due to headache   Zetia [Ezetimibe] Other (See Comments)    Head pressure/headaches Chest discomfort    History of Present Illness    46 year old male with above past medical history including CAD, hyperlipidemia, GERD, obesity status post gastric sleeve, depression, and nephrolithiasis.  In November 2020, he was evaluated secondary to intermittent chest tightness and burning.  He had an abnormal exercise treadmill test with 2 mm horizontal ST segment depression in V4 through V6.  Coronary CT angiography showed a calcium score of 923 (99th percentile), and greater than 70% proximal and mid LAD and distal RCA stenosis.  Catheterization revealed a 90% mid LAD stenosis, 90% proximal second diagonal stenosis, occluded left circumflex, and an occluded R PDAV.  The mid LAD  was successfully intervened upon using a 3.0 x 38 mm Synergy XD drug-eluting stent.  He continued to have intermittent chest discomfort following his catheterization and was placed on ranolazine with improvement.  At December 2022 follow-up, he reported a several week history of intermittent chest discomfort, different from prior angina.  He thought symptoms might have been related to a recent trial  of sertraline however, symptoms persisted despite stopping it.  At January 19 visit, he also reported intermittent exertional symptoms and he was set up for diagnostic catheterization, which was performed January 27, revealing severe mid RCA disease, which was stented with a 3.0 x 18 mm Onyx frontier drug-eluting stent.  He was discharged home the following day on dual antiplatelet therapy.  Since discharge, he has noted significant improvement in chest discomfort symptoms.  He occasionally notes a fleeting, right-sided chest pain that is sharp in nature, lasts a few seconds, and resolve spontaneously.  He has been exercising on his elliptical for 30 minutes at a time without symptoms or limitations.  Prior to PCI, he would experience symptoms almost immediately when on his elliptical.  He denies dyspnea, palpitations, PND, orthopnea, dizziness, syncope, edema, or early satiety.  Home Medications    Current Outpatient Medications  Medication Sig Dispense Refill   amLODipine (NORVASC) 5 MG tablet Take 1 tablet (5 mg total) by mouth daily. NO FURTHER REFILLS UNTIL SEEN IN CLINIC. PLEASE CALL OFFICE TO SCHEDULE APPOINTMENT. 90 tablet 0   aspirin EC 81 MG tablet Take 81 mg by mouth daily.      Biotin 10000 MCG TABS Take 10,000 mcg by mouth in the morning.     buPROPion (WELLBUTRIN XL) 150 MG 24 hr tablet Take 1 tablet (150 mg total) by mouth daily with breakfast. 60 tablet 3   Cholecalciferol (VITAMIN D3) 125 MCG (5000 UT) TABS Take 5,000 Units by mouth in the morning.     clopidogrel (PLAVIX) 75 MG tablet TAKE 1 TABLET BY MOUTH DAILY. PLEASE CALL OFFICE TO SCHEDULE APPOINTMENT FOR FURTHER REFILLS. 90 tablet 0   cyclobenzaprine (FLEXERIL) 10 MG tablet TAKE 1 TABLET BY MOUTH AT BEDTIME AS NEEDED FOR MUSCLE SPASMS 90 tablet 1   ezetimibe (ZETIA) 10 MG tablet Take 1 tablet (10 mg total) by mouth daily. 90 tablet 3   fluticasone (FLONASE) 50 MCG/ACT nasal spray Place 1 spray into both nostrils in the morning  and at bedtime.     furosemide (LASIX) 20 MG tablet Take 1 tablet (20 mg total) by mouth daily. 90 tablet 3   LORazepam (ATIVAN) 1 MG tablet TAKE 1 TABLET BY MOUTH DAILY AS NEEDED FOR ANXIETY 30 tablet 5   Multiple Vitamin (MULTIVITAMIN WITH MINERALS) TABS tablet Take 1 tablet by mouth 2 (two) times a week.     pantoprazole (PROTONIX) 40 MG tablet TAKE 1 TABLET (40 MG TOTAL) BY MOUTH DAILY. [place] OR DINNER *STOP NEXIUM (Patient taking differently: 40 mg at bedtime.) 90 tablet 3   ranolazine (RANEXA) 1000 MG SR tablet TAKE 1 TABLET BY MOUTH TWICE A DAY 180 tablet 3   rosuvastatin (CRESTOR) 40 MG tablet Take 1 tablet (40 mg total) by mouth daily. (Patient taking differently: Take 40 mg by mouth at bedtime.) 90 tablet 3   tadalafil (CIALIS) 20 MG tablet 1 tab 1 hour prior to intercourse.  Do not take nitroglycerin within 36 hours of taking tadalafil 30 tablet 5   tetrahydrozoline 0.05 % ophthalmic solution Place 1 drop into both eyes daily.  nitroGLYCERIN (NITROSTAT) 0.4 MG SL tablet Place 1 tablet (0.4 mg total) under the tongue every 5 (five) minutes as needed for chest pain. 25 tablet 1   No current facility-administered medications for this visit.     Review of Systems    Occasional sharp and fleeting right-sided chest pain.  He denies angina similar to what he was experiencing prior to PCI, dyspnea, palpitations, PND, orthopnea, dizziness, syncope, edema, or early satiety.  All other systems reviewed and are otherwise negative except as noted above.   Cardiac Rehabilitation Eligibility Assessment      Physical Exam    VS:  BP 116/74    Pulse 76    Ht 5\' 7"  (1.702 m)    Wt 215 lb (97.5 kg)    SpO2 98%    BMI 33.67 kg/m  , BMI Body mass index is 33.67 kg/m.     GEN: Well nourished, well developed, in no acute distress. HEENT: normal. Neck: Supple, no JVD, carotid bruits, or masses. Cardiac: RRR, no murmurs, rubs, or gallops. No clubbing, cyanosis, edema.   Radials/PT 2+ and equal bilaterally.  Right radial catheterization site without bleeding, bruit, or hematoma. Respiratory:  Respirations regular and unlabored, clear to auscultation bilaterally. GI: Soft, nontender, nondistended, BS + x 4. MS: no deformity or atrophy. Skin: warm and dry, no rash. Neuro:  Strength and sensation are intact. Psych: Normal affect.  Accessory Clinical Findings    ECG personally reviewed by me today -regular sinus rhythm, 76- no acute changes.  Lab Results  Component Value Date   WBC 6.8 03/04/2021   HGB 13.3 03/04/2021   HCT 38.7 (L) 03/04/2021   MCV 91.7 03/04/2021   PLT 198 03/04/2021   Lab Results  Component Value Date   CREATININE 0.99 03/04/2021   BUN 8 03/04/2021   NA 140 03/04/2021   K 3.6 03/04/2021   CL 104 03/04/2021   CO2 28 03/04/2021   Lab Results  Component Value Date   ALT 13 02/01/2021   AST 18 02/01/2021   ALKPHOS 71 02/01/2021   BILITOT 0.4 02/01/2021   Lab Results  Component Value Date   CHOL 148 02/01/2021   HDL 61 02/01/2021   LDLCALC 72 02/01/2021   TRIG 75 02/01/2021   CHOLHDL 2.4 02/01/2021    Lab Results  Component Value Date   HGBA1C 4.9 01/26/2020    Assessment & Plan    1.  Coronary artery disease: Status post mid LAD stenting in January 2021 with recurrent angina in late 2022 prompting diagnostic catheterization in January of this year, revealing severe mid RCA stenosis with patent LAD stent.  The RCA was successfully treated with a drug-eluting stent.  He has a chronic total occlusion of the left circumflex with left to left collaterals as well as a chronic total occlusion of the RPA V, with left-to-right collaterals.  Since his PCI, he has had some sharp and fleeting chest pain but no recurrent angina and has been tolerating exercise well.  He remains on aspirin, Plavix, statin, Zetia, and Ranexa.  He has an open referral to cardiac rehab.  2.  Hyperlipidemia: LDL of 72 in December.  He was placed on  Zetia previously but then stopped it due to concerns that it was causing chest pain.  He did agree to resume following hospitalization and thus far, as tolerated.  We will plan to follow-up lipids and LFTs in about 4 weeks and consider PCSK9 inhibitor if LDL not less than 55.  He was agreeable to that plan.  3.  Morbid obesity: BMI of 33.67.  Post PCI, he has been exercising on his elliptical.  Encouraged ongoing regular activity and caloric intake changes with a goal of weight loss.  4.  Disposition: Follow-up lipids and LFTs in approximately 4 weeks.  Follow-up in clinic in 8 weeks or sooner if necessary.  Murray Hodgkins, NP 03/20/2021, 5:21 PM

## 2021-03-30 ENCOUNTER — Other Ambulatory Visit: Payer: Self-pay | Admitting: Internal Medicine

## 2021-03-30 DIAGNOSIS — K219 Gastro-esophageal reflux disease without esophagitis: Secondary | ICD-10-CM

## 2021-04-08 ENCOUNTER — Other Ambulatory Visit: Payer: Self-pay | Admitting: Internal Medicine

## 2021-04-17 ENCOUNTER — Other Ambulatory Visit: Payer: 59

## 2021-04-29 IMAGING — CT CT HEART MORP W/ CTA COR W/ SCORE W/ CA W/CM &/OR W/O CM
1 of 13 series · 6 of 20 positions shown, 8 images · non-contrast
Comparison: None.

Addendum:
CLINICAL DATA: 43-year-old male with hyperlipidemia, FH of early
CAD and abnormal exercise treadmill stress test.

EXAM:
Cardiac/Coronary  CTA
TECHNIQUE: The patient was scanned on a Phillips Force scanner.

[Series 45: (id)% cta coronary 0.60 · axial · 0.40mm/px · z∈[-1432,-1344]mm · 6 of 6220 slices shown, 8 images]
[im 889/6220  vessel]
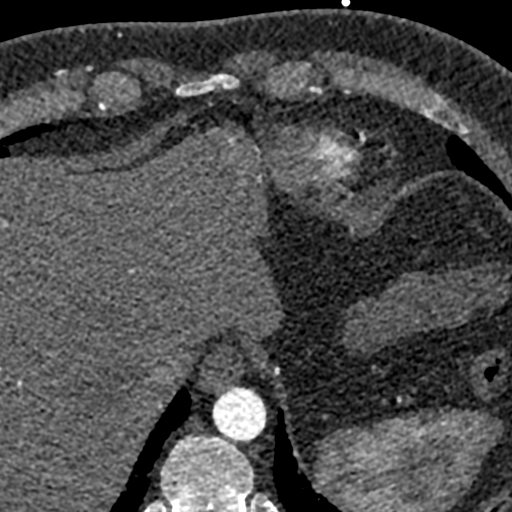
[im 889/6220  lung]
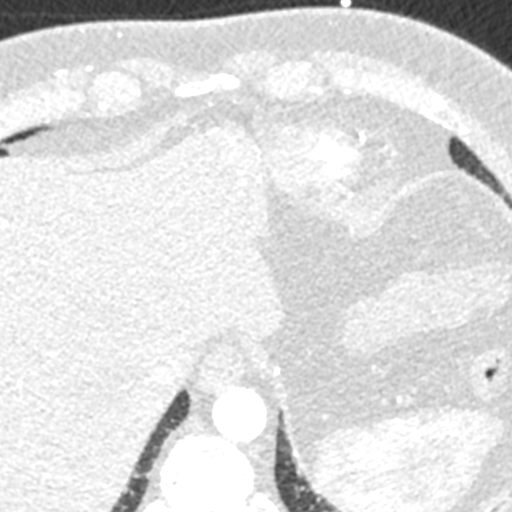
[im 1777/6220  vessel]
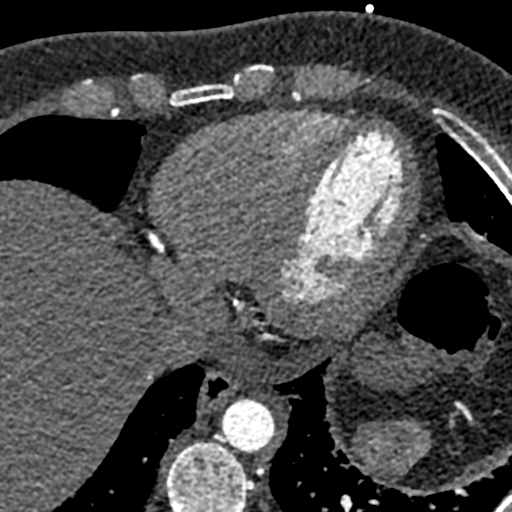
[im 2666/6220  vessel]
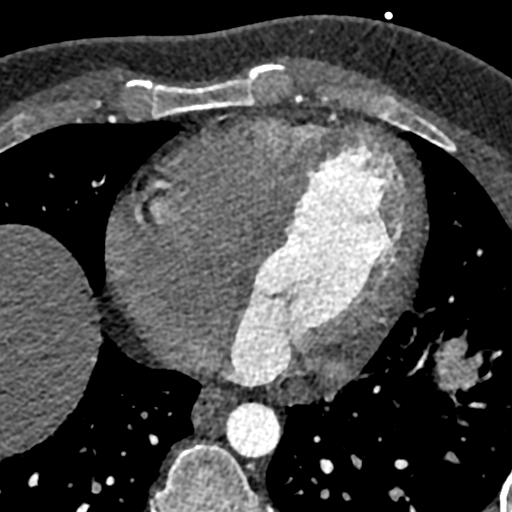
[im 3554/6220  vessel]
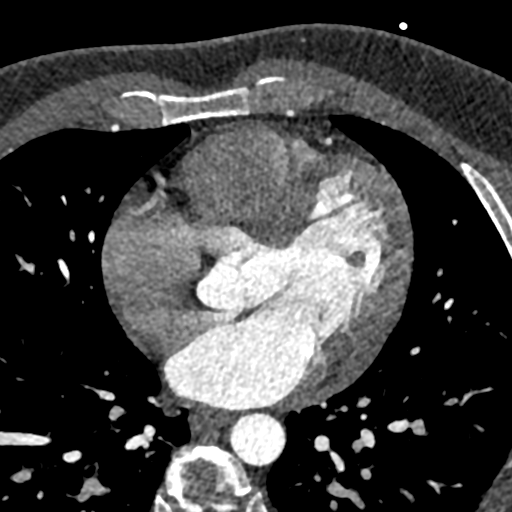
[im 4443/6220  vessel]
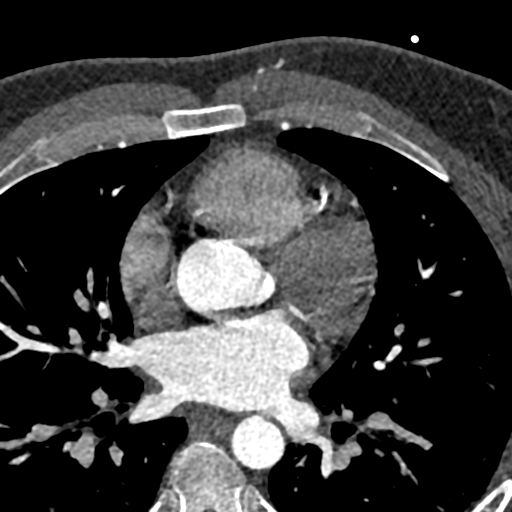
[im 4443/6220  lung]
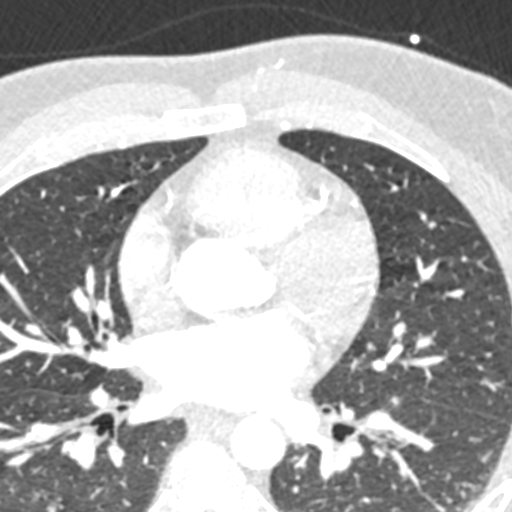
[im 5331/6220  vessel]
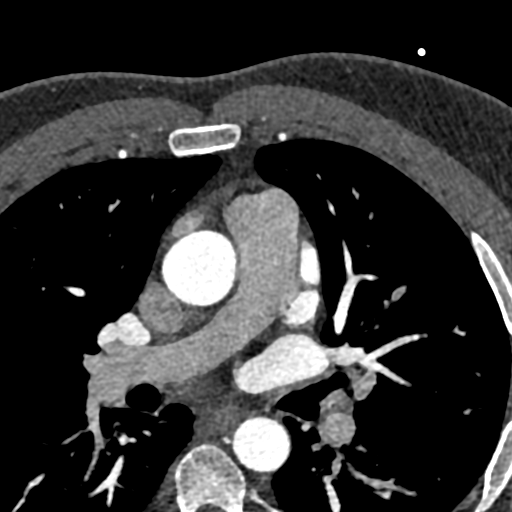

[6 of 20 positions shown; findings below may reference images not displayed]



Aorta:  Normal size.  No calcifications.  No dissection.

Aortic Valve:  Trileaflet.  No calcifications.

Coronary Arteries:  Normal coronary origin.  Right dominance.

RCA is a large dominant artery that gives rise to PDA and PLA. There
is motion in the mid RCA, however there appears to be only mild
plaque in the proximal and mid segment. Distal RCA has a long
segment of severe, predominantly calcified plaque with probable
stenosis > 70%.

PLA and PDA are poorly visualized.

Left main is a large artery that gives rise to LAD and LCX arteries.
Left main has no plaque.

LAD is a large vessel that gives rise to two small diagonal
arteries. Proximal LAD has a diffuse severe complex plaque, there is
moderate non-calcified plaque with stenosis 50-69% followed by
severe predominantly calcified plaque suspicious for stenosis > 70%.
Mid LAD has another dense highly calcified lesion suspicious for
stenosis > 70%. Mid to distal and distal LAD has mild plaque.

D1,2 are very small branches with luminal diameter < 2 mm.

LCX is a non-dominant artery that gives rise to one small OM1
branch. There is mild diffuse calcified plaque with stenosis 25-49%.

Other findings:

Normal pulmonary vein drainage into the left atrium.

Normal left atrial appendage without a thrombus.

Normal size of the pulmonary artery.
IMPRESSION: 1. Coronary calcium score of 823. This was 99 percentile for age and
sex matched control.

2. Normal coronary origin with right dominance.

3. Study quality is affected by motion and dense calcifications.
However, there appears to be possible severe stenosis in the
proximal and mid LAD and in the distal RCA. A cardiac
catheterization is recommended. CAD-RADS 4 Severe stenosis.
(70-99%).

EXAM:
OVER-READ INTERPRETATION  CT CHEST

The following report is an over-read performed by radiologist Dr.
over-read does not include interpretation of cardiac or coronary
anatomy or pathology. The coronary calcium score and cardiac CTA
interpretation by the cardiologist is attached.
FINDINGS: Aortic atherosclerosis. Tiny calcified granuloma in the right middle
lobe incidentally noted. Within the visualized portions of the
thorax there are no suspicious appearing pulmonary nodules or
masses, there is no acute consolidative airspace disease, no pleural
effusions, no pneumothorax and no lymphadenopathy. Visualized
portions of the upper abdomen are unremarkable. There are no
aggressive appearing lytic or blastic lesions noted in the
visualized portions of the skeleton.
IMPRESSION: 1.  Aortic Atherosclerosis ([id number]).

*** End of Addendum ***
FINDINGS: A 100 kV prospective scan was triggered in the descending thoracic
aorta at 111 HU's. Axial non-contrast 3 mm slices were carried out
through the heart. The data set was analyzed on a dedicated work
station and scored using the Agatson method. Gantry rotation speed
was 250 msecs and collimation was .6 mm. No beta blockade and 0.8 mg
of sl NTG was given. The 3D data set was reconstructed in 5%
intervals of the 67-82 % of the R-R cycle. Diastolic phases were
analyzed on a dedicated work station using MPR, MIP and VRT modes.
The patient received 80 cc of contrast.

Aorta:  Normal size.  No calcifications.  No dissection.

Aortic Valve:  Trileaflet.  No calcifications.

Coronary Arteries:  Normal coronary origin.  Right dominance.

RCA is a large dominant artery that gives rise to PDA and PLA. There
is motion in the mid RCA, however there appears to be only mild
plaque in the proximal and mid segment. Distal RCA has a long
segment of severe, predominantly calcified plaque with probable
stenosis > 70%.

PLA and PDA are poorly visualized.

Left main is a large artery that gives rise to LAD and LCX arteries.
Left main has no plaque.

LAD is a large vessel that gives rise to two small diagonal
arteries. Proximal LAD has a diffuse severe complex plaque, there is
moderate non-calcified plaque with stenosis 50-69% followed by
severe predominantly calcified plaque suspicious for stenosis > 70%.
Mid LAD has another dense highly calcified lesion suspicious for
stenosis > 70%. Mid to distal and distal LAD has mild plaque.

D1,2 are very small branches with luminal diameter < 2 mm.

LCX is a non-dominant artery that gives rise to one small OM1
branch. There is mild diffuse calcified plaque with stenosis 25-49%.

Other findings:

Normal pulmonary vein drainage into the left atrium.

Normal left atrial appendage without a thrombus.

Normal size of the pulmonary artery.
IMPRESSION: 1. Coronary calcium score of 823. This was 99 percentile for age and
sex matched control.

2. Normal coronary origin with right dominance.

3. Study quality is affected by motion and dense calcifications.
However, there appears to be possible severe stenosis in the
proximal and mid LAD and in the distal RCA. A cardiac
catheterization is recommended. CAD-RADS 4 Severe stenosis.
(70-99%).

## 2021-05-18 ENCOUNTER — Ambulatory Visit: Payer: 59 | Admitting: Internal Medicine

## 2021-05-26 ENCOUNTER — Telehealth: Payer: Self-pay | Admitting: Urology

## 2021-05-26 MED ORDER — DIAZEPAM 10 MG PO TABS: ORAL_TABLET | ORAL | 0 refills | Status: DC

## 2021-05-26 NOTE — Telephone Encounter (Signed)
Spoke with patient about his vasectomy and he asked if he could get a valium. ?I will make sure his pharmacy is correct . ?[facility] in [place] ?Sharyn Lull ?

## 2021-06-02 NOTE — Telephone Encounter (Signed)
Rescheduled cancelled appts  ?

## 2021-06-07 ENCOUNTER — Encounter: Payer: Self-pay | Admitting: Internal Medicine

## 2021-06-07 ENCOUNTER — Ambulatory Visit: Payer: 59 | Admitting: Internal Medicine

## 2021-06-07 VITALS — BP 120/72 | HR 61 | Temp 97.9°F | Resp 14 | Ht 67.0 in | Wt 219.8 lb

## 2021-06-07 DIAGNOSIS — Z903 Acquired absence of stomach [part of]: Secondary | ICD-10-CM | POA: Diagnosis not present

## 2021-06-07 DIAGNOSIS — Z1283 Encounter for screening for malignant neoplasm of skin: Secondary | ICD-10-CM

## 2021-06-07 DIAGNOSIS — E559 Vitamin D deficiency, unspecified: Secondary | ICD-10-CM

## 2021-06-07 DIAGNOSIS — E538 Deficiency of other specified B group vitamins: Secondary | ICD-10-CM

## 2021-06-07 DIAGNOSIS — Z0001 Encounter for general adult medical examination with abnormal findings: Secondary | ICD-10-CM

## 2021-06-07 DIAGNOSIS — E785 Hyperlipidemia, unspecified: Secondary | ICD-10-CM

## 2021-06-07 DIAGNOSIS — R110 Nausea: Secondary | ICD-10-CM | POA: Diagnosis not present

## 2021-06-07 DIAGNOSIS — E611 Iron deficiency: Secondary | ICD-10-CM

## 2021-06-07 DIAGNOSIS — R140 Abdominal distension (gaseous): Secondary | ICD-10-CM

## 2021-06-07 DIAGNOSIS — K5900 Constipation, unspecified: Secondary | ICD-10-CM

## 2021-06-07 DIAGNOSIS — Z13818 Encounter for screening for other digestive system disorders: Secondary | ICD-10-CM

## 2021-06-07 DIAGNOSIS — D649 Anemia, unspecified: Secondary | ICD-10-CM

## 2021-06-07 DIAGNOSIS — Z0000 Encounter for general adult medical examination without abnormal findings: Secondary | ICD-10-CM

## 2021-06-07 DIAGNOSIS — F419 Anxiety disorder, unspecified: Secondary | ICD-10-CM

## 2021-06-07 DIAGNOSIS — I25118 Atherosclerotic heart disease of native coronary artery with other forms of angina pectoris: Secondary | ICD-10-CM

## 2021-06-07 DIAGNOSIS — M62838 Other muscle spasm: Secondary | ICD-10-CM

## 2021-06-07 DIAGNOSIS — Z0184 Encounter for antibody response examination: Secondary | ICD-10-CM

## 2021-06-07 MED ORDER — AMLODIPINE BESYLATE 5 MG PO TABS
5.0000 mg | ORAL_TABLET | Freq: Every day | ORAL | 3 refills | Status: DC
Start: 2021-06-07 — End: 2021-07-05

## 2021-06-07 MED ORDER — CYCLOBENZAPRINE HCL 10 MG PO TABS: ORAL_TABLET | ORAL | 1 refills | Status: DC

## 2021-06-07 MED ORDER — LORAZEPAM 1 MG PO TABS: 1.0000 mg | ORAL_TABLET | Freq: Every day | ORAL | 5 refills | Status: DC | PRN

## 2021-06-07 NOTE — Patient Instructions (Addendum)
1-2 x per day miralax in 8 ounces of fluid ?Colace 1-2 x per day ?Warm prune juice  ?Ginger tea ?Kiwis  ? ?Align pre and probiotic  ? ?[facility] ?Dermatologist in [place], [place] ?Address: [address] (off [address], [place] [address] ? ? ? ? ?Phone: [phone] ?[facility]  ? ?Phone Fax E-mail Address  ?[phone] Not available [employer]  ? [place] [address]  ?   ?Specialties     ?Gastroenterology     ? ? ?Constipation, Adult ?Constipation is when a person has fewer than three bowel movements in a week, has difficulty having a bowel movement, or has stools (feces) that are dry, hard, or larger than normal. Constipation may be caused by an underlying condition. It may become worse with age if a person takes certain medicines and does not take in enough fluids. ?Follow these instructions at home: ?Eating and drinking ? ?Eat foods that have a lot of fiber, such as beans, whole grains, and fresh fruits and vegetables. ?Limit foods that are low in fiber and high in fat and processed sugars, such as fried or sweet foods. These include french fries, hamburgers, cookies, candies, and soda. ?Drink enough fluid to keep your urine pale yellow. ?General instructions ?Exercise regularly or as told by your health care provider. Try to do 150 minutes of moderate exercise each week. ?Use the bathroom when you have the urge to go. Do not hold it in. ?Take over-the-counter and prescription medicines only as told by your health care provider. This includes any fiber supplements. ?During bowel movements: ?Practice deep breathing while relaxing the lower abdomen. ?Practice pelvic floor relaxation. ?Watch your condition for any changes. Let your health care provider know about them. ?Keep all follow-up visits as told by your health care provider. This is important. ?Contact a health care provider if: ?You have pain that gets worse. ?You have a fever. ?You do not  have a bowel movement after 4 days. ?You vomit. ?You are not hungry or you lose weight. ?You are bleeding from the opening between the buttocks (anus). ?You have thin, pencil-like stools. ?Get help right away if: ?You have a fever and your symptoms suddenly get worse. ?You leak stool or have blood in your stool. ?Your abdomen is bloated. ?You have severe pain in your abdomen. ?You feel dizzy or you faint. ?Summary ?Constipation is when a person has fewer than three bowel movements in a week, has difficulty having a bowel movement, or has stools (feces) that are dry, hard, or larger than normal. ?Eat foods that have a lot of fiber, such as beans, whole grains, and fresh fruits and vegetables. ?Drink enough fluid to keep your urine pale yellow. ?Take over-the-counter and prescription medicines only as told by your health care provider. This includes any fiber supplements. ?This information is not intended to replace advice given to you by your health care provider. Make sure you discuss any questions you have with your health care provider. ?Document Revised: 12/10/2018 Document Reviewed: 12/10/2018 ?Elsevier Patient Education ? [employer]. ? ? ? ?Abdominal Bloating ?When you have abdominal bloating, your abdomen may feel full, tight, or painful. It may also look bigger than normal or swollen (distended). Common causes of abdominal bloating include: ?Swallowing air. ?Constipation. ?Problems digesting food. ?Eating too much. ?Irritable bowel syndrome. This is a condition that affects the large intestine. ?Lactose intolerance. This is an inability to digest lactose, a natural sugar in  dairy products. ?Celiac disease. This is a condition that affects the ability to digest gluten, a protein found in some grains. ?Gastroparesis. This is a condition that slows down the movement of food in the stomach and small intestine. It is more common in people with diabetes mellitus. ?Gastroesophageal reflux disease (GERD). This  is a condition that makes stomach acid flow back into the esophagus. ?Urinary retention. This means that the body is holding onto urine, and the bladder cannot be emptied all the way. ?Follow these instructions at home: ?Eating and drinking ?Avoid eating too much. ?Try not to swallow air while talking or eating. ?Avoid eating while lying down. ?Avoid these foods and drinks: ?Foods that cause gas, such as broccoli, cabbage, cauliflower, and baked beans. ?Carbonated drinks. ?Hard candy. ?Chewing gum. ?Medicines ?Take over-the-counter and prescription medicines only as told by your health care provider. ?Take probiotic medicines. These medicines contain live bacteria or yeasts that can help digestion. ?Take coated peppermint oil capsules. ?General instructions ?Try to exercise regularly. Exercise may help to relieve bloating that is caused by gas and relieve constipation. ?Keep all follow-up visits. This is important. ?Contact a health care provider if: ?You have nausea and vomiting. ?You have diarrhea. ?You have abdominal pain. ?You have unusual weight loss or weight gain. ?You have severe pain, and medicines do not help. ?Get help right away if: ?You have chest pain. ?You have trouble breathing. ?You have shortness of breath. ?You have trouble urinating. ?You have darker urine than normal. ?You have blood in your stools or have dark, tarry stools. ?These symptoms may represent a serious problem that is an emergency. Do not wait to see if the symptoms will go away. Get medical help right away. Call your local emergency services (911 in the [place] Do not drive yourself to the hospital. ?Summary ?Abdominal bloating means that the abdomen is swollen. ?Common causes of abdominal bloating are swallowing air, constipation, and problems digesting food. ?Avoid eating too much and avoid swallowing air. ?Avoid foods that cause gas, carbonated drinks, hard candy, and chewing gum. ?This information is not intended to replace  advice given to you by your health care provider. Make sure you discuss any questions you have with your health care provider. ?Document Revised: 08/25/2019 Document Reviewed: 08/25/2019 ?Elsevier Patient Education ? [employer]. ? ?

## 2021-06-07 NOTE — Progress Notes (Signed)
Chief Complaint  ?Patient presents with  ? Annual Exam  ? ?Annual  ?1. C/o ab distension  right ab> left and feels like abdomen more "puffy" right > left and epigastric discomfort/pain  ?ongoing x 1 month with fh ibs-constipation (women in family have ibs), he has constipation tried miralax in coffee. At times +nausea and vomiting no diarrhea or ab pain ?He is s/p gastric sleeve years ago  ?He is avoiding dairy and meat  ?2. Anxiety wants rx ativan  ? ? ?Review of Systems  ?Gastrointestinal:  Positive for abdominal pain, nausea and vomiting. Negative for constipation and diarrhea.  ?Past Medical History:  ?Diagnosis Date  ? Anxiety   ? Aortic atherosclerosis ([place])   ? CAD (coronary artery disease)   ? a. 12/2018 ETT: Ex time 9:39. 54m horiz ST dep in V4-V6 with abnormal coronary CTA. b. 02/2019 PCI: LM nl, LAD 60p/m, 960m3.0x38 Synergy XD DES), 40d, D2 90p, LCX 10057mCA 38m51md (FFR 0.8), RPDA 40, RPAV 100 CTO. EF 55%; c. 02/2021 PCI: LM nl, LAD patent stent, 40d, D2 90, LCX 30ost/p, 100m 30m OM1/2 20, RCA 72m (36m18 Onyx Frontier DES), 60d, RPAV 100 CTO (L->R collats). EF 55-65%.  ? Chest pain   ? Depression   ? GERD (gastroesophageal reflux disease)   ? History of echocardiogram   ? a. 12/2018 Echo: EF 60-65%, no rwma. Mildly dil LA.  ? Hyperlipidemia   ? Kidney stones   ? ?Past Surgical History:  ?Procedure Laterality Date  ? CHOLECYSTECTOMY    ? CORONARY ATHERECTOMY N/A 02/13/2019  ? Procedure: CORONARY ATHERECTOMY;  Surgeon: End, CNelva Bush [facility];  Service: Cardiovascular;  Laterality: N/A;  ? CORONARY STENT INTERVENTION N/A 02/13/2019  ? Procedure: CORONARY STENT INTERVENTION;  Surgeon: End, CNelva Bush [facility];  Service: Cardiovascular;  Laterality: N/A;  ? CORONARY STENT INTERVENTION N/A 03/03/2021  ? Procedure: CORONARY STENT INTERVENTION;  Surgeon: End, CNelva Bush [facility];  Service: Cardiovascular;  Laterality: N/A;  ?  INTRAVASCULAR PRESSURE WIRE/FFR STUDY N/A 02/13/2019  ? Procedure: INTRAVASCULAR PRESSURE WIRE/FFR STUDY;  Surgeon: End, CNelva Bush [facility];  Service: Cardiovascular;  Laterality: N/A;  ? INTRAVASCULAR ULTRASOUND/IVUS N/A 02/13/2019  ? Procedure: Intravascular Ultrasound/IVUS;  Surgeon: End, CNelva Bush [facility];  Service: Cardiovascular;  Laterality: N/A;  ? LEFT HEART CATH AND CORONARY ANGIOGRAPHY N/A 02/13/2019  ? Procedure: LEFT HEART CATH AND CORONARY ANGIOGRAPHY;  Surgeon: End, CNelva Bush [facility];  Service: Cardiovascular;  Laterality: N/A;  ? LEFT HEART CATH AND CORONARY ANGIOGRAPHY Left 03/03/2021  ? Procedure: LEFT HEART CATH AND CORONARY ANGIOGRAPHY;  Surgeon: End, CNelva Bush [facility];  Service: Cardiovascular;  Laterality: Left;  ? vertical sleeve gastrectomy    ? 2013 85% stomach removed was 280 lbs before surgery   ? ?Family History  ?Problem Relation Age of Onset  ? Diabetes Father   ? Heart disease Father   ?     x2  ? Hyperlipidemia Father   ? Hypertension Father   ? Heart attack Father 45  ? 2  x2; CABG x 3 11/2019  ? Heart disease Brother   ?     MI in 40s  ?74sart attack Brother 40  ? 4ocial History  ? ?Socioeconomic History  ? Marital status: Married  ?  Spouse name: JessicJanett Billow  Number of children: 5  ? Years of education: Not on file  ? Highest education level: Not on file  ?Occupational History  ? Occupation: medical coding   ?  Comment: [employer]  ?Tobacco Use  ? Smoking status: Never  ? Smokeless tobacco: Never  ?Vaping Use  ? Vaping Use: Never used  ?Substance and Sexual Activity  ? Alcohol use: Yes  ?  Comment: occas. 2-3 x per month  ? Drug use: Never  ? Sexual activity: Yes  ?  Partners: Female  ?Other Topics Concern  ? Not on file  ?Social History Narrative  ? Moved from [place]   ?   ? Some college   ? ?Social Determinants of Health  ? ?Financial Resource Strain: Not on file  ?Food  Insecurity: Not on file  ?Transportation Needs: Not on file  ?Physical Activity: Not on file  ?Stress: Not on file  ?Social Connections: Not on file  ?Intimate Partner Violence: Not on file  ? ?Current Meds  ?Medication Sig  ? aspirin EC 81 MG tablet Take 81 mg by mouth daily.   ? Biotin 10000 MCG TABS Take 10,000 mcg by mouth in the morning.  ? Cholecalciferol (VITAMIN D3) 125 MCG (5000 UT) TABS Take 5,000 Units by mouth in the morning.  ? clopidogrel (PLAVIX) 75 MG tablet Take 1 tablet (75 mg total) by mouth daily.  ? diazepam (VALIUM) 10 MG tablet 1 tab po 30 min prior to procedure  ? ezetimibe (ZETIA) 10 MG tablet Take 1 tablet (10 mg total) by mouth daily.  ? fluticasone (FLONASE) 50 MCG/ACT nasal spray Place 1 spray into both nostrils in the morning and at bedtime.  ? furosemide (LASIX) 20 MG tablet Take 1 tablet (20 mg total) by mouth daily.  ? Multiple Vitamin (MULTIVITAMIN WITH MINERALS) TABS tablet Take 1 tablet by mouth 2 (two) times a week.  ? nitroGLYCERIN (NITROSTAT) 0.4 MG SL tablet Place 1 tablet (0.4 mg total) under the tongue every 5 (five) minutes as needed for chest pain.  ? pantoprazole (PROTONIX) 40 MG tablet TAKE 1 TABLET (40 MG TOTAL) BY MOUTH DAILY. [place] OR DINNER *STOP NEXIUM  ? ranolazine (RANEXA) 1000 MG SR tablet TAKE 1 TABLET BY MOUTH TWICE A DAY  ? rosuvastatin (CRESTOR) 40 MG tablet Take 1 tablet (40 mg total) by mouth daily. (Patient taking differently: Take 40 mg by mouth at bedtime.)  ? tadalafil (CIALIS) 20 MG tablet 1 tab 1 hour prior to intercourse.  Do not take nitroglycerin within 36 hours of taking tadalafil  ? tetrahydrozoline 0.05 % ophthalmic solution Place 1 drop into both eyes daily.  ? [DISCONTINUED] amLODipine (NORVASC) 5 MG tablet Take 1 tablet (5 mg total) by mouth daily.  ? [DISCONTINUED] cyclobenzaprine (FLEXERIL) 10 MG tablet TAKE 1 TABLET BY MOUTH AT BEDTIME AS NEEDED FOR MUSCLE SPASMS  ? [DISCONTINUED] LORazepam (ATIVAN) 1 MG tablet TAKE 1  TABLET BY MOUTH DAILY AS NEEDED FOR ANXIETY  ? ?Allergies  ?Allergen Reactions  ? Imdur [Isosorbide Nitrate]   ?  Did not tolerate due to headache  ? Zetia [Ezetimibe] Other (See Comments)  ?  Head pressure/headaches ?Chest discomfort  ? ?No results found for this or any previous visit (from the past 2160 hour(s)). ?Objective  ?Body mass index is 34.43 kg/m?. ?Wt Readings from Last 3 Encounters:  ?06/07/21 219 lb 12.8 oz (99.7 kg)  ?03/20/21 215 lb (97.5 kg)  ?03/03/21 211 lb 4.8 oz (95.8 kg)  ? ?Temp  Readings from Last 3 Encounters:  ?06/07/21 97.9 ?F (36.6 ?C) (Oral)  ?03/04/21 98.1 ?F (36.7 ?C) (Oral)  ?11/04/20 98.5 ?F (36.9 ?C) (Temporal)  ? ?BP Readings from Last 3 Encounters:  ?06/07/21 120/72  ?03/20/21 116/74  ?03/04/21 107/69  ? ?Pulse Readings from Last 3 Encounters:  ?06/07/21 61  ?03/20/21 76  ?03/04/21 (!) 59  ? ? ?Physical Exam ?Vitals and nursing note reviewed.  ?Constitutional:   ?   Appearance: Normal appearance. He is well-developed and well-groomed.  ?HENT:  ?   Head: Normocephalic and atraumatic.  ?Eyes:  ?   Conjunctiva/sclera: Conjunctivae normal.  ?   Pupils: Pupils are equal, round, and reactive to light.  ?Cardiovascular:  ?   Rate and Rhythm: Normal rate and regular rhythm.  ?   Heart sounds: Normal heart sounds.  ?Pulmonary:  ?   Effort: Pulmonary effort is normal. No respiratory distress.  ?   Breath sounds: Normal breath sounds.  ?Abdominal:  ?   Tenderness: There is no abdominal tenderness.  ?Skin: ?   General: Skin is warm and moist.  ?   Comments: Several nevi to trunk  ?Neurological:  ?   General: No focal deficit present.  ?   Mental Status: He is alert and oriented to person, place, and time. Mental status is at baseline.  ?   Sensory: Sensation is intact.  ?   Motor: Motor function is intact.  ?   Coordination: Coordination is intact.  ?   Gait: Gait is intact. Gait normal.  ?Psychiatric:     ?   Attention and Perception: Attention and perception normal.     ?   Mood and Affect:  Mood and affect normal.     ?   Speech: Speech normal.     ?   Behavior: Behavior normal. Behavior is cooperative.     ?   Thought Content: Thought content normal.     ?   Cognition and Memory: Cognition and m

## 2021-06-14 ENCOUNTER — Other Ambulatory Visit: Payer: 59

## 2021-06-16 ENCOUNTER — Encounter: Payer: 59 | Admitting: Urology

## 2021-06-20 ENCOUNTER — Ambulatory Visit: Payer: 59 | Admitting: Nurse Practitioner

## 2021-06-20 ENCOUNTER — Encounter: Payer: Self-pay | Admitting: Nurse Practitioner

## 2021-06-20 ENCOUNTER — Other Ambulatory Visit
Admission: RE | Admit: 2021-06-20 | Discharge: 2021-06-20 | Disposition: A | Discharge status: Home / Self Care | Payer: 59 | Attending: Nurse Practitioner | Admitting: Nurse Practitioner

## 2021-06-20 VITALS — BP 110/74 | HR 68 | Ht 67.0 in | Wt 222.0 lb

## 2021-06-20 DIAGNOSIS — R140 Abdominal distension (gaseous): Secondary | ICD-10-CM | POA: Diagnosis present

## 2021-06-20 DIAGNOSIS — K5909 Other constipation: Secondary | ICD-10-CM | POA: Diagnosis present

## 2021-06-20 DIAGNOSIS — R110 Nausea: Secondary | ICD-10-CM | POA: Insufficient documentation

## 2021-06-20 DIAGNOSIS — D649 Anemia, unspecified: Secondary | ICD-10-CM | POA: Insufficient documentation

## 2021-06-20 DIAGNOSIS — K5900 Constipation, unspecified: Secondary | ICD-10-CM | POA: Diagnosis present

## 2021-06-20 DIAGNOSIS — E611 Iron deficiency: Secondary | ICD-10-CM | POA: Diagnosis present

## 2021-06-20 DIAGNOSIS — I25118 Atherosclerotic heart disease of native coronary artery with other forms of angina pectoris: Secondary | ICD-10-CM

## 2021-06-20 DIAGNOSIS — E785 Hyperlipidemia, unspecified: Secondary | ICD-10-CM | POA: Diagnosis present

## 2021-06-20 LAB — LIPID PANEL
Cholesterol: 124 mg/dL (ref 0–200)
HDL: 45 mg/dL (ref >40)
LDL Cholesterol: 57 mg/dL (ref 0–99)
Total CHOL/HDL Ratio: 2.8 RATIO
Triglycerides: 110 mg/dL (ref <150)
VLDL: 22 mg/dL (ref 0–40)

## 2021-06-20 LAB — CBC WITH DIFFERENTIAL/PLATELET
Abs Immature Granulocytes: 0.01 10*3/uL (ref 0.00–0.07)
Basophils Absolute: 0 10*3/uL (ref 0.0–0.1)
Basophils Relative: 0 %
Eosinophils Absolute: 0.2 10*3/uL (ref 0.0–0.5)
Eosinophils Relative: 3 %
HCT: 41.7 % (ref 39.0–52.0)
Hemoglobin: 14.1 g/dL (ref 13.0–17.0)
Immature Granulocytes: 0 %
Lymphocytes Relative: 31 %
Lymphs Abs: 1.7 10*3/uL (ref 0.7–4.0)
MCH: 30.6 pg (ref 26.0–34.0)
MCHC: 33.8 g/dL (ref 30.0–36.0)
MCV: 90.5 fL (ref 80.0–100.0)
Monocytes Absolute: 0.5 10*3/uL (ref 0.1–1.0)
Monocytes Relative: 9 %
Neutro Abs: 3.1 10*3/uL (ref 1.7–7.7)
Neutrophils Relative %: 57 %
Platelets: 205 10*3/uL (ref 150–400)
RBC: 4.61 MIL/uL (ref 4.22–5.81)
RDW: 12.8 % (ref 11.5–15.5)
WBC: 5.6 10*3/uL (ref 4.0–10.5)
nRBC: 0 % (ref 0.0–0.2)

## 2021-06-20 LAB — COMPREHENSIVE METABOLIC PANEL
ALT: 16 U/L (ref 0–44)
AST: 19 U/L (ref 15–41)
Albumin: 4.3 g/dL (ref 3.5–5.0)
Alkaline Phosphatase: 54 U/L (ref 38–126)
Anion gap: 7 (ref 5–15)
BUN: 11 mg/dL (ref 6–20)
CO2: 28 mmol/L (ref 22–32)
Calcium: 9.2 mg/dL (ref 8.9–10.3)
Chloride: 104 mmol/L (ref 98–111)
Creatinine, Ser: 1.09 mg/dL (ref 0.61–1.24)
GFR, Estimated: >60 mL/min (ref >60)
Glucose, Bld: 115 mg/dL — ABNORMAL HIGH (ref 70–99)
Potassium: 4.1 mmol/L (ref 3.5–5.1)
Sodium: 139 mmol/L (ref 135–145)
Total Bilirubin: 0.8 mg/dL (ref 0.3–1.2)
Total Protein: 7.4 g/dL (ref 6.5–8.1)

## 2021-06-20 LAB — FERRITIN: Ferritin: 14 ng/mL — ABNORMAL LOW (ref 24–336)

## 2021-06-20 LAB — IRON AND TIBC
Iron: 93 ug/dL (ref 45–182)
Saturation Ratios: 21 % (ref 17.9–39.5)
TIBC: 442 ug/dL (ref 250–450)
UIBC: 349 ug/dL

## 2021-06-20 LAB — BRAIN NATRIURETIC PEPTIDE: B Natriuretic Peptide: 16 pg/mL (ref 0.0–100.0)

## 2021-06-20 LAB — VITAMIN B12: Vitamin B-12: 264 pg/mL (ref 180–914)

## 2021-06-20 LAB — VITAMIN D 25 HYDROXY (VIT D DEFICIENCY, FRACTURES): Vit D, 25-Hydroxy: 31.75 ng/mL (ref 30–100)

## 2021-06-20 NOTE — Progress Notes (Signed)
? ? ?Office Visit  ?  ?Patient Name: Marcus Roberts ?Date of Encounter: 06/20/2021 ? ?Primary Care Provider:  McLean-Scocuzza, Nino Glow, MD ?Primary Cardiologist:  Nelva Bush, MD ? ?Chief Complaint  ?  ?45 year old male with a history of CAD, hyperlipidemia, GERD, obesity status post gastric sleeve, depression, and nephrolithiasis, who presents for follow-up of CAD. ? ?Past Medical History  ?  ?Past Medical History:  ?Diagnosis Date  ? Anxiety   ? Aortic atherosclerosis ([place])   ? CAD (coronary artery disease)   ? a. 12/2018 ETT: Ex time 9:39. 90m horiz ST dep in V4-V6 with abnormal coronary CTA. b. 02/2019 PCI: LM nl, LAD 60p/m, 957m3.0x38 Synergy XD DES), 40d, D2 90p, LCX 10069mCA 60m36md (FFR 0.8), RPDA 40, RPAV 100 CTO. EF 55%; c. 02/2021 PCI: LM nl, LAD patent stent, 40d, D2 90, LCX 30ost/p, 100m 90m OM1/2 20, RCA 84m (41m18 Onyx Frontier DES), 60d, RPAV 100 CTO (L->R collats). EF 55-65%.  ? Chest pain   ? Depression   ? GERD (gastroesophageal reflux disease)   ? History of echocardiogram   ? a. 12/2018 Echo: EF 60-65%, no rwma. Mildly dil LA.  ? Hyperlipidemia   ? Kidney stones   ? ?Past Surgical History:  ?Procedure Laterality Date  ? CHOLECYSTECTOMY    ? CORONARY ATHERECTOMY N/A 02/13/2019  ? Procedure: CORONARY ATHERECTOMY;  Surgeon: End, CNelva Bush [facility];  Service: Cardiovascular;  Laterality: N/A;  ? CORONARY STENT INTERVENTION N/A 02/13/2019  ? Procedure: CORONARY STENT INTERVENTION;  Surgeon: End, CNelva Bush [facility];  Service: Cardiovascular;  Laterality: N/A;  ? CORONARY STENT INTERVENTION N/A 03/03/2021  ? Procedure: CORONARY STENT INTERVENTION;  Surgeon: End, CNelva Bush [facility];  Service: Cardiovascular;  Laterality: N/A;  ? INTRAVASCULAR PRESSURE WIRE/FFR STUDY N/A 02/13/2019  ? Procedure: INTRAVASCULAR PRESSURE WIRE/FFR STUDY;  Surgeon: End, CNelva Bush [facility];  Service: Cardiovascular;   Laterality: N/A;  ? INTRAVASCULAR ULTRASOUND/IVUS N/A 02/13/2019  ? Procedure: Intravascular Ultrasound/IVUS;  Surgeon: End, CNelva Bush [facility];  Service: Cardiovascular;  Laterality: N/A;  ? LEFT HEART CATH AND CORONARY ANGIOGRAPHY N/A 02/13/2019  ? Procedure: LEFT HEART CATH AND CORONARY ANGIOGRAPHY;  Surgeon: End, CNelva Bush [facility];  Service: Cardiovascular;  Laterality: N/A;  ? LEFT HEART CATH AND CORONARY ANGIOGRAPHY Left 03/03/2021  ? Procedure: LEFT HEART CATH AND CORONARY ANGIOGRAPHY;  Surgeon: End, CNelva Bush [facility];  Service: Cardiovascular;  Laterality: Left;  ? vertical sleeve gastrectomy    ? 2013 85% stomach removed was 280 lbs before surgery   ? ? ?Allergies ? ?Allergies  ?Allergen Reactions  ? Imdur [Isosorbide Nitrate]   ?  Did not tolerate due to headache  ? Zetia [Ezetimibe] Other (See Comments)  ?  Head pressure/headaches ?Chest discomfort  ? ? ?History of Present Illness  ?  ?45 yea48old male with above past medical history including CAD, hyperlipidemia, GERD, obesity status post gastric sleeve, depression, and nephrolithiasis.  In November 2020, he was evaluated secondary to intermittent chest tightness and burning.  He had an abnormal exercise treadmill test with 2 mm horizontal ST segment depression in V4 through V6.  Coronary CT angiography showed a calcium score of 923 (99th percentile), and greater than 70% proximal and mid LAD and distal RCA stenoses.  Diagnostic catheterization revealed a 90% mid LAD stenosis, 90% proximal  second diagonal stenosis, occluded left circumflex, and an occluded RPAV.  The mid LAD was successfully treated with a drug-eluting stent.  He continued to have intermittent chest discomfort following catheterization was placed on ranolazine with improvement.  At December 2022 follow-up, he reported a several week history of intermittent chest discomfort, different from prior angina.  He  initially thought symptoms might have been related to a recent trial of sertraline however, when symptoms persisted despite discontinuing sertraline, we opted to perform diagnostic catheterization in January 2023, revealing severe mid RCA disease, which was stented with a 3.0 x 18 mm Onyx frontier drug-eluting stent.  At last follow-up in February 2023, he reported occasional, fleeting, sharp right-sided chest discomfort but otherwise good activity tolerance.  In the setting of an LDL of 72 in December, we added Zetia 10 mg daily at his last visit. ? ?From a cardiovascular standpoint, Mr. Chiara notes that he continues to do well.  He exercises on his elliptical regularly without symptoms or limitations.  He still has occasional fleeting chest discomfort though he notes this is different from prior angina and is not concerning to him.  Since March, he has been experiencing abdominal bloating and intermittent constipation.  He has tried multiple colon cleanses and assorted over-the-counter remedies without relief.  He was recently seen by primary care and has been referred to GI.  Symptoms seem to start about a month or so after starting Zetia, and he is interested in taking a Zetia holiday.  He denies dyspnea, palpitations, PND, orthopnea, dizziness, syncope, edema, or early satiety. ? ?Home Medications  ?  ?Current Outpatient Medications  ?Medication Sig Dispense Refill  ? amLODipine (NORVASC) 5 MG tablet Take 1 tablet (5 mg total) by mouth daily. 90 tablet 3  ? aspirin EC 81 MG tablet Take 81 mg by mouth daily.     ? Biotin 10000 MCG TABS Take 10,000 mcg by mouth in the morning.    ? Cholecalciferol (VITAMIN D3) 125 MCG (5000 UT) TABS Take 5,000 Units by mouth in the morning.    ? clopidogrel (PLAVIX) 75 MG tablet Take 1 tablet (75 mg total) by mouth daily. 90 tablet 0  ? cyclobenzaprine (FLEXERIL) 10 MG tablet TAKE 1 TABLET BY MOUTH AT BEDTIME AS NEEDED FOR MUSCLE SPASMS 90 tablet 1  ? diazepam (VALIUM) 10 MG  tablet 1 tab po 30 min prior to procedure 1 tablet 0  ? ezetimibe (ZETIA) 10 MG tablet Take 1 tablet (10 mg total) by mouth daily. 90 tablet 3  ? fluticasone (FLONASE) 50 MCG/ACT nasal spray Place 1 spray into both nostrils in the morning and at bedtime.    ? furosemide (LASIX) 20 MG tablet Take 1 tablet (20 mg total) by mouth daily. 90 tablet 3  ? LORazepam (ATIVAN) 1 MG tablet Take 1 tablet (1 mg total) by mouth daily as needed. for anxiety 30 tablet 5  ? Multiple Vitamin (MULTIVITAMIN WITH MINERALS) TABS tablet Take 1 tablet by mouth 2 (two) times a week.    ? nitroGLYCERIN (NITROSTAT) 0.4 MG SL tablet Place 1 tablet (0.4 mg total) under the tongue every 5 (five) minutes as needed for chest pain. 25 tablet 1  ? pantoprazole (PROTONIX) 40 MG tablet TAKE 1 TABLET (40 MG TOTAL) BY MOUTH DAILY. 30 MIN BEFORE BREAKFAST OR DINNER *STOP NEXIUM 90 tablet 3  ? ranolazine (RANEXA) 1000 MG SR tablet TAKE 1 TABLET BY MOUTH TWICE A DAY 180 tablet 3  ? rosuvastatin (CRESTOR) 40 MG tablet  Take 1 tablet (40 mg total) by mouth daily. (Patient taking differently: Take 40 mg by mouth at bedtime.) 90 tablet 3  ? tadalafil (CIALIS) 20 MG tablet 1 tab 1 hour prior to intercourse.  Do not take nitroglycerin within 36 hours of taking tadalafil 30 tablet 5  ? tetrahydrozoline 0.05 % ophthalmic solution Place 1 drop into both eyes daily.    ? ?No current facility-administered medications for this visit.  ?  ? ?Review of Systems  ?  ?Occasional fleeting chest pain as outlined above.  Also has been experiencing constipation and bloating over the past 2 months.  He denies dyspnea, PND, orthopnea, dizziness, syncope, edema, or early satiety.  All other systems reviewed and are otherwise negative except as noted above. ?  ? ?Physical Exam  ?  ?VS:  BP 110/74 (BP Location: Left Arm, Patient Position: Sitting, Cuff Size: Normal)   Pulse 68   Ht '5\' 7"'$  (1.702 m)   Wt 222 lb (100.7 kg)   SpO2 99%   BMI 34.77 kg/m?  , BMI Body mass index is  34.77 kg/m?. ?    ?GEN: Well nourished, well developed, in no acute distress. ?HEENT: normal. ?Neck: Supple, no JVD, carotid bruits, or masses. ?Cardiac: RRR, no murmurs, rubs, or gallops. No clubbing, cyanosis

## 2021-06-20 NOTE — Patient Instructions (Addendum)
Medication Instructions:  ?HOLD ezetimibe for 2 weeks to see if symptoms resolve. If no change then restart.  ? ? ?*If you need a refill on your cardiac medications before your next appointment, please call your pharmacy* ? ? ?Lab Work: ?CBC with diff, Lipid, LPA, BNP, CMET, Celiac disease screen, iron binding capacity, Vit D, Vit B12, and ferritin. ? ? ?If you have labs (blood work) drawn today and your tests are completely normal, you will receive your results only by: ?MyChart Message (if you have MyChart) OR ?A paper copy in the mail ?If you have any lab test that is abnormal or we need to change your treatment, we will call you to review the results. ? ? ?Testing/Procedures: ?None ? ? ?Follow-Up: ?At [facility], you and your health needs are our priority.  As part of our continuing mission to provide you with exceptional heart care, we have created designated Provider Care Teams.  These Care Teams include your primary Cardiologist (physician) and Advanced Practice Providers (APPs -  Physician Assistants and Nurse Practitioners) who all work together to provide you with the care you need, when you need it. ? ? ?Your next appointment:   ?6 month(s) ? ?The format for your next appointment:   ?In Person ? ?Provider:   ?Nelva Bush, MD or Murray Hodgkins, NP  ? ? ? ? ?Important Information About Sugar ? ? ? ? ?  ?

## 2021-06-21 ENCOUNTER — Telehealth: Payer: Self-pay

## 2021-06-21 LAB — HEPATITIS B SURFACE ANTIBODY, QUANTITATIVE: Hep B S AB Quant (Post): <3.1 m[IU]/mL — ABNORMAL LOW (ref >9.9)

## 2021-06-21 NOTE — Telephone Encounter (Signed)
Lvm for pt to return call in regards to labs. ? ?Per Dr.Tracy: ?Consider new hep B vaccine x 2 doses not protected  ?Liver kidneys normal  ?Ferritin low rec multivitamin with iron daily otc other iron labs normal  ?B12 low normal should be in multivitamin  ?BNP lab norma  ?Vitamin D low normal rec D3 total 2000 IU daily otc  ?Cholesterol improved  ?Blood cts normal  ? ?Lipoprotein A pending  ?Celiac not done do you still want this lab?  ? ?

## 2021-06-22 LAB — LIPOPROTEIN A (LPA): Lipoprotein (a): 354.9 nmol/L — ABNORMAL HIGH (ref <75.0)

## 2021-06-23 DIAGNOSIS — Z006 Encounter for examination for normal comparison and control in clinical research program: Secondary | ICD-10-CM

## 2021-06-23 NOTE — Research (Signed)
Tried to contact patient about Ocean(a) trial. Message was left on phone and email sent to patient with the ICF attached. Will try to contact again next week

## 2021-06-24 LAB — CELIAC DISEASE COMPLETE PANEL
Antigliadin Abs, IgA: 4 units (ref 0–19)
Gliadin IgG: 2 units (ref 0–19)
IgA: 202 mg/dL (ref 90–386)
Tissue Transglut Ab: 4 U/mL (ref 0–5)
Tissue Transglutaminase Ab, IgA: <2 U/mL (ref 0–3)

## 2021-06-27 ENCOUNTER — Telehealth: Payer: Self-pay | Admitting: Internal Medicine

## 2021-06-27 NOTE — Telephone Encounter (Signed)
No celiac labs resulted

## 2021-06-27 NOTE — Telephone Encounter (Signed)
-----   Message from Valora Corporal, RN sent at 06/27/2021 10:02 AM EDT ----- Add on Celiac Disease complete panel resulted for this patient.

## 2021-06-29 ENCOUNTER — Ambulatory Visit: Payer: Self-pay | Admitting: Urology

## 2021-06-30 ENCOUNTER — Encounter: Payer: 59 | Admitting: Urology

## 2021-06-30 ENCOUNTER — Telehealth: Payer: Self-pay | Admitting: *Deleted

## 2021-06-30 NOTE — Telephone Encounter (Signed)
   Pre-operative Risk Assessment    Patient Name: Marcus Roberts  DOB: 06/16/1975 MRN: [id number]      Request for Surgical Clearance    Procedure:   VASECTOMY   Date of Surgery:  Clearance 06/30/21                           Surgeon:  DR. Bernardo Heater Surgeon's Group or Practice Name:  [facility] Phone number:  [phone] Fax number:  [phone]   Type of Clearance Requested:   - Medical  - Pharmacy:  Hold Aspirin and Clopidogrel (Plavix)     Type of Anesthesia:  Not Indicated   Additional requests/questions:    Jiles Prows   06/30/2021, 9:14 AM

## 2021-06-30 NOTE — Telephone Encounter (Signed)
Marcus Roberts is a 46 year old male is requesting preoperative cardiac evaluation for vasectomy.  He was seen in the clinic on 06/20/2021.  He remained stable from a cardiac standpoint.  He remains physically active.   His PMH includes hyperlipidemia, morbid obesity, anemia, coronary artery disease status post PCI with DES to his LAD (2021), repeat cardiac catheterization 1/23 which showed patent LAD, 99% RCA and received PCI with DES.  His PMH also includes GERD, and renal stones.  May his aspirin and Plavix be held prior to his procedure?  Thank you for your help.  Please direct your response to [facility] preop pool.  Jossie Ng. Adelbert Gaspard NP-C    06/30/2021, 9:42 AM [address] Office [phone] Fax [phone]

## 2021-06-30 NOTE — Telephone Encounter (Signed)
Clopidogrel can be held for 5-7 days before the procedure and restarted when safe to do so from a urologic standpoint.  Aspirin 81 mg daily should be continued throughout the perioperative period.  Nelva Bush, MD [facility]

## 2021-06-30 NOTE — Telephone Encounter (Signed)
Dual antiplatelet therapy has been addressed.  Preoperative team, please contact this patient and set up a phone call appointment for further cardiac evaluation.  Thank you for your help.  Jossie Ng. Scarlettrose Costilow NP-C    06/30/2021, 10:53 AM [address] Office [phone] Fax [phone]

## 2021-06-30 NOTE — Telephone Encounter (Signed)
Left message for the pt to call the office to schedule a tele pre op appt 

## 2021-07-04 NOTE — Telephone Encounter (Signed)
Left message to call back to schedule a tele pre op appt.  

## 2021-07-05 ENCOUNTER — Encounter: Payer: Self-pay | Admitting: Internal Medicine

## 2021-07-05 ENCOUNTER — Other Ambulatory Visit: Payer: Self-pay | Admitting: Internal Medicine

## 2021-07-05 ENCOUNTER — Other Ambulatory Visit
Admission: RE | Admit: 2021-07-05 | Discharge: 2021-07-05 | Disposition: A | Discharge status: Home / Self Care | Payer: 59 | Attending: Internal Medicine | Admitting: Internal Medicine

## 2021-07-05 ENCOUNTER — Ambulatory Visit (INDEPENDENT_AMBULATORY_CARE_PROVIDER_SITE_OTHER): Payer: 59 | Admitting: Internal Medicine

## 2021-07-05 VITALS — BP 120/74 | HR 67 | Ht 67.0 in | Wt 222.0 lb

## 2021-07-05 DIAGNOSIS — E785 Hyperlipidemia, unspecified: Secondary | ICD-10-CM | POA: Diagnosis not present

## 2021-07-05 DIAGNOSIS — I25110 Atherosclerotic heart disease of native coronary artery with unstable angina pectoris: Secondary | ICD-10-CM | POA: Insufficient documentation

## 2021-07-05 LAB — CBC
HCT: 42 % (ref 39.0–52.0)
Hemoglobin: 14.5 g/dL (ref 13.0–17.0)
MCH: 31.3 pg (ref 26.0–34.0)
MCHC: 34.5 g/dL (ref 30.0–36.0)
MCV: 90.7 fL (ref 80.0–100.0)
Platelets: 213 10*3/uL (ref 150–400)
RBC: 4.63 MIL/uL (ref 4.22–5.81)
RDW: 12.7 % (ref 11.5–15.5)
WBC: 6.4 10*3/uL (ref 4.0–10.5)
nRBC: 0 % (ref 0.0–0.2)

## 2021-07-05 LAB — BASIC METABOLIC PANEL
Anion gap: 8 (ref 5–15)
BUN: 17 mg/dL (ref 6–20)
CO2: 29 mmol/L (ref 22–32)
Calcium: 9.4 mg/dL (ref 8.9–10.3)
Chloride: 104 mmol/L (ref 98–111)
Creatinine, Ser: 0.91 mg/dL (ref 0.61–1.24)
GFR, Estimated: >60 mL/min (ref >60)
Glucose, Bld: 115 mg/dL — ABNORMAL HIGH (ref 70–99)
Potassium: 3.9 mmol/L (ref 3.5–5.1)
Sodium: 141 mmol/L (ref 135–145)

## 2021-07-05 MED ORDER — AMLODIPINE BESYLATE 5 MG PO TABS: 5.0000 mg | ORAL_TABLET | Freq: Every day | ORAL | 0 refills | Status: DC

## 2021-07-05 MED ORDER — ROSUVASTATIN CALCIUM 40 MG PO TABS: 40.0000 mg | ORAL_TABLET | Freq: Every day | ORAL | 1 refills | Status: DC

## 2021-07-05 MED ORDER — CLOPIDOGREL BISULFATE 75 MG PO TABS: 75.0000 mg | ORAL_TABLET | Freq: Every day | ORAL | 0 refills | Status: DC

## 2021-07-05 MED ORDER — RANOLAZINE ER 1000 MG PO TB12: 1000.0000 mg | ORAL_TABLET | Freq: Two times a day (BID) | ORAL | 0 refills | Status: DC

## 2021-07-05 MED ORDER — SODIUM CHLORIDE 0.9% FLUSH: 3.0000 mL | Freq: Two times a day (BID) | INTRAVENOUS | Status: DC

## 2021-07-05 NOTE — Patient Instructions (Signed)
Medication Instructions:   Your physician recommends that you continue on your current medications as directed. Please refer to the Current Medication list given to you today.  Refills have been sent today for your Plavix, Amlodipine, Ranexa, and Crestor  *If you need a refill on your cardiac medications before your next appointment, please call your pharmacy*   Lab Work:  Today at the medical mall at [facility]: BMET, CBC  -  Please go to the [facility] in at the Registration Desk: 1st desk to the right, past the screening table   If you have labs (blood work) drawn today and your tests are completely normal, you will receive your results only by: [place] (if you have MyChart) OR A paper copy in the mail If you have any lab test that is abnormal or we need to change your treatment, we will call you to review the results.   Testing/Procedures:  You are scheduled for a Cardiac Catheterization on Tuesday, June 6 with Dr. Harrell Gave End.  1. Please arrive at the [place] [facility] at 8:30 AM (This time is one hour before your procedure to ensure your preparation). Free valet parking service is available.   Special note: Every effort is made to have your procedure done on time. Please understand that emergencies sometimes delay scheduled procedures.  2. Diet: Do not eat solid foods after midnight.  You may have clear liquids until 5 AM upon the day of the procedure.  3. Labs: To be completed today (instructions above)  4. Medication instructions in preparation for your procedure:    - HOLD Furosemide (Lasix) the morning of procedure.  On the morning of your procedure, take Aspirin and Plavix/Clopidogrel and any morning medicines NOT listed above.  You may use sips of water.  5. Plan to go home the same day, you will only stay overnight if medically necessary. 6. You MUST have a responsible adult to drive you home. 7. An adult MUST be with you  the first 24 hours after you arrive home. 8. Bring a current list of your medications, and the last time and date medication taken. 9. Bring ID and current insurance cards. 10.Please wear clothes that are easy to get on and off and wear slip-on shoes.  Thank you for allowing [place] to care for you!   -- [facility] services    Follow-Up: At [facility], you and your health needs are our priority.  As part of our continuing mission to provide you with exceptional heart care, we have created designated Provider Care Teams.  These Care Teams include your primary Cardiologist (physician) and Advanced Practice Providers (APPs -  Physician Assistants and Nurse Practitioners) who all work together to provide you with the care you need, when you need it.  We recommend signing up for the patient portal called "MyChart".  Sign up information is provided on this After Visit Summary.  MyChart is used to connect with patients for Virtual Visits (Telemedicine).  Patients are able to view lab/test results, encounter notes, upcoming appointments, etc.  Non-urgent messages can be sent to your provider as well.   To learn more about what you can do with MyChart, go to [email or web address].    Your next appointment:   3 week(s)  The format for your next appointment:   In Person  Provider:   You may see Nelva Bush, MD or one of the following Advanced Practice Providers on your  designated Care Team:   Murray Hodgkins, NP Christell Faith, PA-C Cadence Kathlen Mody, PA-C{  Important Information About Sugar

## 2021-07-05 NOTE — H&P (View-Only) (Signed)
Follow-up Outpatient Visit Date: 07/05/2021  Primary Care Provider: McLean-Scocuzza, Nino Glow, MD [place] [address]  Chief Complaint: Headache and neck pain  HPI:  Marcus Roberts is a 46 y.o. male with history of coronary artery disease, hyperlipidemia, GERD, obesity status post gastric sleeve, depression, and nephrolithiasis, who presents for urgent evaluation of recurrent angina.  He was last seen in our office about 2 weeks ago by Ignacia Bayley, NP, at which time he was doing well from a heart standpoint.  He complained of abdominal bloating and intermittent constipation.  Our office reached out to Marcus Roberts today to discuss preoperative cardiovascular risk assessment in anticipation of vasectomy.  He noted a pressure-like feeling in his head and right side of the neck reminiscent of what he experienced prior to his last PCI to the RCA in January.  He reports that the symptoms began about a week ago and have been associated with exertional dyspnea.  He denies frank chest pain though he occasionally has vague uncomfortable feelings in his chest that have been present off and on for years.  He denies palpitations, lightheadedness, and edema.  He reports being compliant with his medications including aspirin and clopidogrel.  He has not taken any sublingual nitroglycerin.  He notes that his abdominal bloating has resolved with cessation of ezetimibe.  --------------------------------------------------------------------------------------------------  Past Medical History:  Diagnosis Date   Anxiety    Aortic atherosclerosis (HCC)    CAD (coronary artery disease)    a. 12/2018 ETT: Ex time 9:39. 49m horiz ST dep in V4-V6 with abnormal coronary CTA. b. 02/2019 PCI: LM nl, LAD 60p/m, 920m3.0x38 Synergy XD DES), 40d, D2 90p, LCX 10059mCA 5m18md (FFR 0.8), RPDA 40, RPAV 100 CTO. EF 55%; c. 02/2021 PCI: LM nl, LAD patent stent, 40d, D2 90, LCX 30ost/p, 100m 22m OM1/2 20, RCA 40m (50m18  Onyx Frontier DES), 60d, RPAV 100 CTO (L->R collats). EF 55-65%.   Chest pain    Depression    GERD (gastroesophageal reflux disease)    History of echocardiogram    a. 12/2018 Echo: EF 60-65%, no rwma. Mildly dil LA.   Hyperlipidemia    Kidney stones    Past Surgical History:  Procedure Laterality Date   CHOLECYSTECTOMY     CORONARY ATHERECTOMY N/A 02/13/2019   Procedure: CORONARY ATHERECTOMY;  Surgeon: Edona Schreffler, CNelva Bush [facility];  Service: Cardiovascular;  Laterality: N/A;   CORONARY STENT INTERVENTION N/A 02/13/2019   Procedure: CORONARY STENT INTERVENTION;  Surgeon: Nathanyal Ashmead, CNelva Bush [facility];  Service: Cardiovascular;  Laterality: N/A;   CORONARY STENT INTERVENTION N/A 03/03/2021   Procedure: CORONARY STENT INTERVENTION;  Surgeon: Lariza Cothron, CNelva Bush [facility];  Service: Cardiovascular;  Laterality: N/A;   INTRAVASCULAR PRESSURE WIRE/FFR STUDY N/A 02/13/2019   Procedure: INTRAVASCULAR PRESSURE WIRE/FFR STUDY;  Surgeon: Analia Zuk, CNelva Bush [facility];  Service: Cardiovascular;  Laterality: N/A;   INTRAVASCULAR ULTRASOUND/IVUS N/A 02/13/2019   Procedure: Intravascular Ultrasound/IVUS;  Surgeon: Casara Perrier, CNelva Bush [facility];  Service: Cardiovascular;  Laterality: N/A;   LEFT HEART CATH AND CORONARY ANGIOGRAPHY N/A 02/13/2019   Procedure: LEFT HEART CATH AND CORONARY ANGIOGRAPHY;  Surgeon: Elizandro Laura, CNelva Bush [facility];  Service: Cardiovascular;  Laterality: N/A;   LEFT HEART CATH AND CORONARY ANGIOGRAPHY Left 03/03/2021   Procedure: LEFT HEART CATH AND CORONARY ANGIOGRAPHY;  Surgeon: Sacha Topor, CNelva Bush  Location: [place] [facility];  Service: Cardiovascular;  Laterality: Left;   vertical sleeve gastrectomy     2013 85% stomach removed was 280 lbs before surgery     Current Meds  Medication Sig   amLODipine (NORVASC) 5 MG tablet TAKE 1 TABLET (5 MG  TOTAL) BY MOUTH DAILY.   aspirin EC 81 MG tablet Take 81 mg by mouth daily.    Biotin 10000 MCG TABS Take 10,000 mcg by mouth in the morning.   Cholecalciferol (VITAMIN D3) 125 MCG (5000 UT) TABS Take 5,000 Units by mouth in the morning.   clopidogrel (PLAVIX) 75 MG tablet TAKE 1 TABLET BY MOUTH EVERY DAY   cyclobenzaprine (FLEXERIL) 10 MG tablet TAKE 1 TABLET BY MOUTH AT BEDTIME AS NEEDED FOR MUSCLE SPASMS   diazepam (VALIUM) 10 MG tablet 1 tab po 30 min prior to procedure   fluticasone (FLONASE) 50 MCG/ACT nasal spray Place 1 spray into both nostrils in the morning and at bedtime.   furosemide (LASIX) 20 MG tablet Take 20 mg by mouth daily as needed.   LORazepam (ATIVAN) 1 MG tablet Take 1 tablet (1 mg total) by mouth daily as needed. for anxiety   Multiple Vitamin (MULTIVITAMIN WITH MINERALS) TABS tablet Take 1 tablet by mouth 2 (two) times a week.   nitroGLYCERIN (NITROSTAT) 0.4 MG SL tablet Place 1 tablet (0.4 mg total) under the tongue every 5 (five) minutes as needed for chest pain.   pantoprazole (PROTONIX) 40 MG tablet TAKE 1 TABLET (40 MG TOTAL) BY MOUTH DAILY. 30 MIN BEFORE BREAKFAST OR DINNER *STOP NEXIUM   ranolazine (RANEXA) 1000 MG SR tablet TAKE 1 TABLET BY MOUTH TWICE A DAY   rosuvastatin (CRESTOR) 40 MG tablet Take 1 tablet (40 mg total) by mouth daily.   tadalafil (CIALIS) 20 MG tablet 1 tab 1 hour prior to intercourse.  Do not take nitroglycerin within 36 hours of taking tadalafil   tetrahydrozoline 0.05 % ophthalmic solution Place 1 drop into both eyes as needed.   [DISCONTINUED] ezetimibe (ZETIA) 10 MG tablet Take 1 tablet (10 mg total) by mouth daily.    Allergies: Imdur [isosorbide nitrate] and Zetia [ezetimibe]  Social History   Tobacco Use   Smoking status: Never   Smokeless tobacco: Never  Vaping Use   Vaping Use: Never used  Substance Use Topics   Alcohol use: Yes    Comment: occas. 2-3 x per month   Drug use: Never    Family History  Problem Relation  Age of Onset   Diabetes Father    Heart disease Father        x2   Hyperlipidemia Father    Hypertension Father    Heart attack Father 1       x2; CABG x 3 11/2019   Heart disease Brother        MI in 10s   Heart attack Brother 40    Review of Systems: A 12-system review of systems was performed and was negative except as noted in the HPI.  --------------------------------------------------------------------------------------------------  Physical Exam: BP 120/74 (BP Location: Left Arm, Patient Position: Sitting, Cuff Size: Large)   Pulse 67   Ht '5\' 7"'$  (1.702 m)   Wt 222 lb (100.7 kg)   SpO2 99%   BMI 34.77 kg/m   General:  NAD. Neck: No JVD or HJR. Lungs: Clear to auscultation bilaterally without wheezes or crackles. Heart: Regular rate and rhythm without murmurs, rubs, or gallops. Abdomen: Soft, nontender, nondistended. Extremities: No  lower extremity edema.  2+ right radial pulse.  EKG: Normal sinus rhythm without abnormality.  Lab Results  Component Value Date   WBC 5.6 06/20/2021   HGB 14.1 06/20/2021   HCT 41.7 06/20/2021   MCV 90.5 06/20/2021   PLT 205 06/20/2021    Lab Results  Component Value Date   NA 139 06/20/2021   K 4.1 06/20/2021   CL 104 06/20/2021   CO2 28 06/20/2021   BUN 11 06/20/2021   CREATININE 1.09 06/20/2021   GLUCOSE 115 (H) 06/20/2021   ALT 16 06/20/2021    Lab Results  Component Value Date   CHOL 124 06/20/2021   HDL 45 06/20/2021   LDLCALC 57 06/20/2021   TRIG 110 06/20/2021   CHOLHDL 2.8 06/20/2021    --------------------------------------------------------------------------------------------------  ASSESSMENT AND PLAN: Coronary artery disease with unstable angina: Marcus Roberts reports a 1 week history of head and neck pain that is reminiscent of what he experienced prior to his most recent PCI to the RCA in January.  While the symptoms are atypical, I am concerned that this may reflect unstable angina given his heavy  burden of CAD.  We have discussed escalation of medical therapy versus repeat cardiac catheterization and have agreed to the latter.  We will continue with dual antiplatelet therapy with aspirin and clopidogrel pending his catheterization, as well as antianginal therapy with ranolazine and amlodipine.  I advised him to seek immediate medical attention if his symptoms were to worsen in the meantime.  Hyperlipidemia: Lipids well controlled on last check about 2 weeks ago.  Abdominal bloating has ceased since stopping ezetimibe.  We will need to consider repeating a lipid panel in a month or 2 to ensure that his LDL has not increased above 70.  Shared Decision Making/Informed Consent The risks [stroke (1 in 1000), death (1 in 1000), kidney failure [usually temporary] (1 in 500), bleeding (1 in 200), allergic reaction [possibly serious] (1 in 200)], benefits (diagnostic support and management of coronary artery disease) and alternatives of a cardiac catheterization were discussed in detail with Marcus Roberts and he is willing to proceed.  Follow-up: Return to clinic in 3 weeks.  Nelva Bush, MD 07/05/2021 3:51 PM

## 2021-07-05 NOTE — Telephone Encounter (Signed)
I was able to reach the pt today. We talked about setting up a tele pre op appt, but pt tells me that he has been having cardiac symptoms like before he had his stent. We have scheduled the pt for in office appt today with Dr. Saunders Revel, pt's cardiologist. Pt states he has been having a pressure feeling in his head and on right jugular side of his neck. Pt states these are the same symptoms he has had right before he had his stent placed. I assured the pt that I will update Dr. Saunders Revel to the appt today.  Pt thanked me for the call and the help.

## 2021-07-05 NOTE — Progress Notes (Signed)
Follow-up Outpatient Visit Date: 07/05/2021  Primary Care Provider: McLean-Scocuzza, Nino Glow, MD [place] [address]  Chief Complaint: Headache and neck pain  HPI:  [name] is a 46 y.o. male with history of coronary artery disease, hyperlipidemia, GERD, obesity status post gastric sleeve, depression, and nephrolithiasis, who presents for urgent evaluation of recurrent angina.  He was last seen in our office about 2 weeks ago by Ignacia Bayley, NP, at which time he was doing well from a heart standpoint.  He complained of abdominal bloating and intermittent constipation.  Our office reached out to Mr. Kubota today to discuss preoperative cardiovascular risk assessment in anticipation of vasectomy.  He noted a pressure-like feeling in his head and right side of the neck reminiscent of what he experienced prior to his last PCI to the RCA in January.  He reports that the symptoms began about a week ago and have been associated with exertional dyspnea.  He denies frank chest pain though he occasionally has vague uncomfortable feelings in his chest that have been present off and on for years.  He denies palpitations, lightheadedness, and edema.  He reports being compliant with his medications including aspirin and clopidogrel.  He has not taken any sublingual nitroglycerin.  He notes that his abdominal bloating has resolved with cessation of ezetimibe.  --------------------------------------------------------------------------------------------------  Past Medical History:  Diagnosis Date   Anxiety    Aortic atherosclerosis (HCC)    CAD (coronary artery disease)    a. 12/2018 ETT: Ex time 9:39. 32m horiz ST dep in V4-V6 with abnormal coronary CTA. b. 02/2019 PCI: LM nl, LAD 60p/m, 933m3.0x38 Synergy XD DES), 40d, D2 90p, LCX 10059mCA 35m2md (FFR 0.8), RPDA 40, RPAV 100 CTO. EF 55%; c. 02/2021 PCI: LM nl, LAD patent stent, 40d, D2 90, LCX 30ost/p, 100m 57m OM1/2 20, RCA 2m (9m18  Onyx Frontier DES), 60d, RPAV 100 CTO (L->R collats). EF 55-65%.   Chest pain    Depression    GERD (gastroesophageal reflux disease)    History of echocardiogram    a. 12/2018 Echo: EF 60-65%, no rwma. Mildly dil LA.   Hyperlipidemia    Kidney stones    Past Surgical History:  Procedure Laterality Date   CHOLECYSTECTOMY     CORONARY ATHERECTOMY N/A 02/13/2019   Procedure: CORONARY ATHERECTOMY;  Surgeon: Ilaria Much, CNelva Bush [facility];  Service: Cardiovascular;  Laterality: N/A;   CORONARY STENT INTERVENTION N/A 02/13/2019   Procedure: CORONARY STENT INTERVENTION;  Surgeon: Jaydis Duchene, CNelva Bush [facility];  Service: Cardiovascular;  Laterality: N/A;   CORONARY STENT INTERVENTION N/A 03/03/2021   Procedure: CORONARY STENT INTERVENTION;  Surgeon: Ethal Gotay, CNelva Bush [facility];  Service: Cardiovascular;  Laterality: N/A;   INTRAVASCULAR PRESSURE WIRE/FFR STUDY N/A 02/13/2019   Procedure: INTRAVASCULAR PRESSURE WIRE/FFR STUDY;  Surgeon: Deem Marmol, CNelva Bush [facility];  Service: Cardiovascular;  Laterality: N/A;   INTRAVASCULAR ULTRASOUND/IVUS N/A 02/13/2019   Procedure: Intravascular Ultrasound/IVUS;  Surgeon: Arianny Pun, CNelva Bush [facility];  Service: Cardiovascular;  Laterality: N/A;   LEFT HEART CATH AND CORONARY ANGIOGRAPHY N/A 02/13/2019   Procedure: LEFT HEART CATH AND CORONARY ANGIOGRAPHY;  Surgeon: Leonette Tischer, CNelva Bush [facility];  Service: Cardiovascular;  Laterality: N/A;   LEFT HEART CATH AND CORONARY ANGIOGRAPHY Left 03/03/2021   Procedure: LEFT HEART CATH AND CORONARY ANGIOGRAPHY;  Surgeon: Denee Boeder, CNelva Bush  Location: [place] [facility];  Service: Cardiovascular;  Laterality: Left;   vertical sleeve gastrectomy     2013 85% stomach removed was 280 lbs before surgery     Current Meds  Medication Sig   amLODipine (NORVASC) 5 MG tablet TAKE 1 TABLET (5 MG  TOTAL) BY MOUTH DAILY.   aspirin EC 81 MG tablet Take 81 mg by mouth daily.    Biotin 10000 MCG TABS Take 10,000 mcg by mouth in the morning.   Cholecalciferol (VITAMIN D3) 125 MCG (5000 UT) TABS Take 5,000 Units by mouth in the morning.   clopidogrel (PLAVIX) 75 MG tablet TAKE 1 TABLET BY MOUTH EVERY DAY   cyclobenzaprine (FLEXERIL) 10 MG tablet TAKE 1 TABLET BY MOUTH AT BEDTIME AS NEEDED FOR MUSCLE SPASMS   diazepam (VALIUM) 10 MG tablet 1 tab po 30 min prior to procedure   fluticasone (FLONASE) 50 MCG/ACT nasal spray Place 1 spray into both nostrils in the morning and at bedtime.   furosemide (LASIX) 20 MG tablet Take 20 mg by mouth daily as needed.   LORazepam (ATIVAN) 1 MG tablet Take 1 tablet (1 mg total) by mouth daily as needed. for anxiety   Multiple Vitamin (MULTIVITAMIN WITH MINERALS) TABS tablet Take 1 tablet by mouth 2 (two) times a week.   nitroGLYCERIN (NITROSTAT) 0.4 MG SL tablet Place 1 tablet (0.4 mg total) under the tongue every 5 (five) minutes as needed for chest pain.   pantoprazole (PROTONIX) 40 MG tablet TAKE 1 TABLET (40 MG TOTAL) BY MOUTH DAILY. 30 MIN BEFORE BREAKFAST OR DINNER *STOP NEXIUM   ranolazine (RANEXA) 1000 MG SR tablet TAKE 1 TABLET BY MOUTH TWICE A DAY   rosuvastatin (CRESTOR) 40 MG tablet Take 1 tablet (40 mg total) by mouth daily.   tadalafil (CIALIS) 20 MG tablet 1 tab 1 hour prior to intercourse.  Do not take nitroglycerin within 36 hours of taking tadalafil   tetrahydrozoline 0.05 % ophthalmic solution Place 1 drop into both eyes as needed.   [DISCONTINUED] ezetimibe (ZETIA) 10 MG tablet Take 1 tablet (10 mg total) by mouth daily.    Allergies: Imdur [isosorbide nitrate] and Zetia [ezetimibe]  Social History   Tobacco Use   Smoking status: Never   Smokeless tobacco: Never  Vaping Use   Vaping Use: Never used  Substance Use Topics   Alcohol use: Yes    Comment: occas. 2-3 x per month   Drug use: Never    Family History  Problem Relation  Age of Onset   Diabetes Father    Heart disease Father        x2   Hyperlipidemia Father    Hypertension Father    Heart attack Father 25       x2; CABG x 3 11/2019   Heart disease Brother        MI in 59s   Heart attack Brother 40    Review of Systems: A 12-system review of systems was performed and was negative except as noted in the HPI.  --------------------------------------------------------------------------------------------------  Physical Exam: BP 120/74 (BP Location: Left Arm, Patient Position: Sitting, Cuff Size: Large)   Pulse 67   Ht '5\' 7"'$  (1.702 m)   Wt 222 lb (100.7 kg)   SpO2 99%   BMI 34.77 kg/m   General:  NAD. Neck: No JVD or HJR. Lungs: Clear to auscultation bilaterally without wheezes or crackles. Heart: Regular rate and rhythm without murmurs, rubs, or gallops. Abdomen: Soft, nontender, nondistended. Extremities: No  lower extremity edema.  2+ right radial pulse.  EKG: Normal sinus rhythm without abnormality.  Lab Results  Component Value Date   WBC 5.6 06/20/2021   HGB 14.1 06/20/2021   HCT 41.7 06/20/2021   MCV 90.5 06/20/2021   PLT 205 06/20/2021    Lab Results  Component Value Date   NA 139 06/20/2021   K 4.1 06/20/2021   CL 104 06/20/2021   CO2 28 06/20/2021   BUN 11 06/20/2021   CREATININE 1.09 06/20/2021   GLUCOSE 115 (H) 06/20/2021   ALT 16 06/20/2021    Lab Results  Component Value Date   CHOL 124 06/20/2021   HDL 45 06/20/2021   LDLCALC 57 06/20/2021   TRIG 110 06/20/2021   CHOLHDL 2.8 06/20/2021    --------------------------------------------------------------------------------------------------  ASSESSMENT AND PLAN: Coronary artery disease with unstable angina: Mr. Walsh reports a 1 week history of head and neck pain that is reminiscent of what he experienced prior to his most recent PCI to the RCA in January.  While the symptoms are atypical, I am concerned that this may reflect unstable angina given his heavy  burden of CAD.  We have discussed escalation of medical therapy versus repeat cardiac catheterization and have agreed to the latter.  We will continue with dual antiplatelet therapy with aspirin and clopidogrel pending his catheterization, as well as antianginal therapy with ranolazine and amlodipine.  I advised him to seek immediate medical attention if his symptoms were to worsen in the meantime.  Hyperlipidemia: Lipids well controlled on last check about 2 weeks ago.  Abdominal bloating has ceased since stopping ezetimibe.  We will need to consider repeating a lipid panel in a month or 2 to ensure that his LDL has not increased above 70.  Shared Decision Making/Informed Consent The risks [stroke (1 in 1000), death (1 in 1000), kidney failure [usually temporary] (1 in 500), bleeding (1 in 200), allergic reaction [possibly serious] (1 in 200)], benefits (diagnostic support and management of coronary artery disease) and alternatives of a cardiac catheterization were discussed in detail with Mr. Stamas and he is willing to proceed.  Follow-up: Return to clinic in 3 weeks.  Nelva Bush, MD 07/05/2021 3:51 PM

## 2021-07-11 ENCOUNTER — Encounter
Admission: RE | Disposition: A | Discharge status: Home / Self Care | Payer: 59 | Source: Home / Self Care | Attending: Internal Medicine

## 2021-07-11 ENCOUNTER — Encounter: Payer: Self-pay | Admitting: Internal Medicine

## 2021-07-11 ENCOUNTER — Ambulatory Visit
Admission: RE | Admit: 2021-07-11 | Discharge: 2021-07-11 | Disposition: A | Discharge status: Home / Self Care | Payer: 59 | Attending: Internal Medicine | Admitting: Internal Medicine

## 2021-07-11 ENCOUNTER — Other Ambulatory Visit: Payer: Self-pay

## 2021-07-11 DIAGNOSIS — K219 Gastro-esophageal reflux disease without esophagitis: Secondary | ICD-10-CM | POA: Insufficient documentation

## 2021-07-11 DIAGNOSIS — E669 Obesity, unspecified: Secondary | ICD-10-CM | POA: Insufficient documentation

## 2021-07-11 DIAGNOSIS — E785 Hyperlipidemia, unspecified: Secondary | ICD-10-CM | POA: Insufficient documentation

## 2021-07-11 DIAGNOSIS — Z7982 Long term (current) use of aspirin: Secondary | ICD-10-CM | POA: Insufficient documentation

## 2021-07-11 DIAGNOSIS — Z9884 Bariatric surgery status: Secondary | ICD-10-CM | POA: Insufficient documentation

## 2021-07-11 DIAGNOSIS — Z7902 Long term (current) use of antithrombotics/antiplatelets: Secondary | ICD-10-CM | POA: Insufficient documentation

## 2021-07-11 DIAGNOSIS — I200 Unstable angina: Secondary | ICD-10-CM

## 2021-07-11 DIAGNOSIS — Z6834 Body mass index (BMI) 34.0-34.9, adult: Secondary | ICD-10-CM | POA: Diagnosis not present

## 2021-07-11 DIAGNOSIS — I25110 Atherosclerotic heart disease of native coronary artery with unstable angina pectoris: Secondary | ICD-10-CM

## 2021-07-11 HISTORY — PX: LEFT HEART CATH AND CORONARY ANGIOGRAPHY: CATH118249

## 2021-07-11 HISTORY — PX: INTRAVASCULAR PRESSURE WIRE/FFR STUDY: CATH118243

## 2021-07-11 LAB — POCT ACTIVATED CLOTTING TIME: Activated Clotting Time: 299 seconds

## 2021-07-11 SURGERY — LEFT HEART CATH AND CORONARY ANGIOGRAPHY
Anesthesia: Moderate Sedation

## 2021-07-11 MED ORDER — SODIUM CHLORIDE 0.9% FLUSH: 3.0000 mL | INTRAVENOUS | Status: DC | PRN

## 2021-07-11 MED ORDER — VERAPAMIL HCL 2.5 MG/ML IV SOLN
INTRAVENOUS | Status: AC
  Filled 2021-07-11: qty 2

## 2021-07-11 MED ORDER — ONDANSETRON HCL 4 MG/2ML IJ SOLN: 4.0000 mg | Freq: Four times a day (QID) | INTRAMUSCULAR | Status: DC | PRN

## 2021-07-11 MED ORDER — LABETALOL HCL 5 MG/ML IV SOLN: 10.0000 mg | INTRAVENOUS | Status: DC | PRN

## 2021-07-11 MED ORDER — HEPARIN (PORCINE) IN NACL 2000-0.9 UNIT/L-% IV SOLN
INTRAVENOUS | Status: DC | PRN
  Administered 2021-07-11: 1000 mL

## 2021-07-11 MED ORDER — SODIUM CHLORIDE 0.9% FLUSH: 3.0000 mL | Freq: Two times a day (BID) | INTRAVENOUS | Status: DC

## 2021-07-11 MED ORDER — HEPARIN SODIUM (PORCINE) 1000 UNIT/ML IJ SOLN
INTRAMUSCULAR | Status: AC
  Filled 2021-07-11: qty 10

## 2021-07-11 MED ORDER — MIDAZOLAM HCL 2 MG/2ML IJ SOLN
INTRAMUSCULAR | Status: DC | PRN
  Administered 2021-07-11 (×2): 1 mg via INTRAVENOUS

## 2021-07-11 MED ORDER — HEPARIN (PORCINE) IN NACL 1000-0.9 UT/500ML-% IV SOLN
INTRAVENOUS | Status: AC
  Filled 2021-07-11: qty 1000

## 2021-07-11 MED ORDER — VERAPAMIL HCL 2.5 MG/ML IV SOLN
INTRAVENOUS | Status: DC | PRN
  Administered 2021-07-11: 2.5 mg via INTRA_ARTERIAL

## 2021-07-11 MED ORDER — SODIUM CHLORIDE 0.9 % WEIGHT BASED INFUSION: 3.0000 mL/kg/h | INTRAVENOUS | Status: AC

## 2021-07-11 MED ORDER — ASPIRIN 81 MG PO CHEW: 81.0000 mg | CHEWABLE_TABLET | ORAL | Status: DC

## 2021-07-11 MED ORDER — IOHEXOL 300 MG/ML  SOLN
INTRAMUSCULAR | Status: DC | PRN
  Administered 2021-07-11: 64 mL

## 2021-07-11 MED ORDER — NITROGLYCERIN 1 MG/10 ML FOR IR/CATH LAB
INTRA_ARTERIAL | Status: DC | PRN
  Administered 2021-07-11: 200 ug via INTRACORONARY

## 2021-07-11 MED ORDER — SODIUM CHLORIDE 0.9 % IV SOLN: 250.0000 mL | INTRAVENOUS | Status: DC | PRN

## 2021-07-11 MED ORDER — FENTANYL CITRATE (PF) 100 MCG/2ML IJ SOLN
INTRAMUSCULAR | Status: DC | PRN
  Administered 2021-07-11: 25 ug via INTRAVENOUS
  Administered 2021-07-11: 50 ug via INTRAVENOUS

## 2021-07-11 MED ORDER — ACETAMINOPHEN 325 MG PO TABS: 650.0000 mg | ORAL_TABLET | ORAL | Status: DC | PRN

## 2021-07-11 MED ORDER — SODIUM CHLORIDE 0.9 % IV SOLN: INTRAVENOUS | Status: DC

## 2021-07-11 MED ORDER — LIDOCAINE HCL (PF) 1 % IJ SOLN
INTRAMUSCULAR | Status: DC | PRN
  Administered 2021-07-11: 2 mL

## 2021-07-11 MED ORDER — HEPARIN SODIUM (PORCINE) 1000 UNIT/ML IJ SOLN
INTRAMUSCULAR | Status: DC | PRN
  Administered 2021-07-11 (×2): 5000 [IU] via INTRAVENOUS

## 2021-07-11 MED ORDER — MIDAZOLAM HCL 2 MG/2ML IJ SOLN
INTRAMUSCULAR | Status: AC
  Filled 2021-07-11: qty 2

## 2021-07-11 MED ORDER — FENTANYL CITRATE (PF) 100 MCG/2ML IJ SOLN
INTRAMUSCULAR | Status: AC
  Filled 2021-07-11: qty 2

## 2021-07-11 MED ORDER — LIDOCAINE HCL 1 % IJ SOLN
INTRAMUSCULAR | Status: AC
  Filled 2021-07-11: qty 20

## 2021-07-11 MED ORDER — HYDRALAZINE HCL 20 MG/ML IJ SOLN: 10.0000 mg | INTRAMUSCULAR | Status: DC | PRN

## 2021-07-11 MED ORDER — SODIUM CHLORIDE 0.9 % WEIGHT BASED INFUSION
1.0000 mL/kg/h | INTRAVENOUS | Status: DC
  Administered 2021-07-11: 1 mL/kg/h via INTRAVENOUS

## 2021-07-11 SURGICAL SUPPLY — 14 items
BAND ZEPHYR COMPRESS 30 LONG (HEMOSTASIS) ×1 IMPLANT
CATH 5F 110X4 TIG (CATHETERS) ×1 IMPLANT
CATH 5FR PIGTAIL DIAGNOSTIC (CATHETERS) ×1 IMPLANT
CATH LAUNCHER 6FR JR4 (CATHETERS) ×1 IMPLANT
DRAPE BRACHIAL (DRAPES) ×1 IMPLANT
GLIDESHEATH SLEND A-KIT 6F 22G (SHEATH) ×1 IMPLANT
GUIDEWIRE INQWIRE 1.5J.035X260 (WIRE) IMPLANT
GUIDEWIRE PRESS OMNI 185 ST (WIRE) ×1 IMPLANT
INQWIRE 1.5J .035X260CM (WIRE) ×3
KIT ENCORE 26 ADVANTAGE (KITS) ×1 IMPLANT
PACK CARDIAC CATH (CUSTOM PROCEDURE TRAY) ×3 IMPLANT
PROTECTION STATION PRESSURIZED (MISCELLANEOUS) ×3
SET ATX SIMPLICITY (MISCELLANEOUS) ×1 IMPLANT
STATION PROTECTION PRESSURIZED (MISCELLANEOUS) IMPLANT

## 2021-07-11 NOTE — Brief Op Note (Signed)
BRIEF CARDIAC CATHETERIZATION NOTE  DATE: 07/11/2021  TIME: 10:18 AM  PATIENT:  Marcus Roberts  46 y.o. male  PRE-OPERATIVE DIAGNOSIS:  Unstable angina  POST-OPERATIVE DIAGNOSIS:  Stable chronic ischemic heart disease  PROCEDURE:  Left heart catheterization and intracoronary flow wire  SURGEON:  Surgeon(s) and Role:    * Daleena Rotter, MD - Primary  FINDINGS: Stable coronary artery disease with 50-60% distal RCA disease that is not hemodynamically significant.  Chronic total occlusions of mid LCx and RCA continuation are unchanged. Widely patent LAD and RCA stents. Normal LVEF with mildly elevated LVEDP.  RECOMMENDATIONS: Continue medical therapy and aggressive secondary prevention.  Nelva Bush, MD [facility]

## 2021-07-11 NOTE — Interval H&P Note (Signed)
History and Physical Interval Note:  07/11/2021 9:29 AM  Marcus Roberts  has presented today for surgery, with the diagnosis of unstable angina.  The various methods of treatment have been discussed with the patient and family. After consideration of risks, benefits and other options for treatment, the patient has consented to  Procedure(s): LEFT HEART CATH AND CORONARY ANGIOGRAPHY (Left) as a surgical intervention.  The patient's history has been reviewed, patient examined, no change in status, stable for surgery.  I have reviewed the patient's chart and labs.  Questions were answered to the patient's satisfaction.    Cath Lab Visit (complete for each Cath Lab visit)  Clinical Evaluation Leading to the Procedure:   ACS: No.  Non-ACS:    Anginal Classification: CCS IV  Anti-ischemic medical therapy: Maximal Therapy (2 or more classes of medications)  Non-Invasive Test Results: No non-invasive testing performed  Prior CABG: No previous CABG  Shataya Winkles

## 2021-07-12 ENCOUNTER — Encounter: Payer: Self-pay | Admitting: Internal Medicine

## 2021-07-12 DIAGNOSIS — Z006 Encounter for examination for normal comparison and control in clinical research program: Secondary | ICD-10-CM

## 2021-07-12 NOTE — Research (Signed)
Tried to contact patient to discuss the [employer] trial but didn't answer. Message was left and another email sent with patient brochures attached

## 2021-07-13 DIAGNOSIS — Z006 Encounter for examination for normal comparison and control in clinical research program: Secondary | ICD-10-CM

## 2021-07-13 NOTE — Research (Signed)
Patient sent an email stating not interested in the trial at this time. Just doesn't see the benefit of being part of it.

## 2021-07-24 ENCOUNTER — Ambulatory Visit: Payer: 59 | Admitting: Medical

## 2021-07-24 NOTE — Progress Notes (Deleted)
Cardiology Office Note:    Date:  07/24/2021   ID:  Marcus Roberts, DOB 1975/02/23, MRN [id number]  PCP:  McLean-Scocuzza, Marcus Glow, MD  [facility] Cardiologist:  Marcus Bush, MD  [facility] Electrophysiologist:  None   Referring MD: McLean-Scocuzza, Marcus Roberts *   Chief Complaint: post-cath f/u  History of Present Illness:    Marcus Roberts is a 46 y.o. male with a hx of CAD, HLD, GERD, obesity s/p gastric sleeve, depression, and nephrolithiasis who presents for cardiac cath follow-up.    In November 2020, he was evaluated secondary to intermittent chest tightness and burning.  He had an abnormal exercise treadmill test with 2 mm horizontal ST segment depression in V4 through V6.  Coronary CT angiography showed a calcium score of 923 (99th percentile), and greater than 70% proximal and mid LAD and distal RCA stenoses.  Diagnostic catheterization revealed a 90% mid LAD stenosis, 90% proximal second diagonal stenosis, occluded left circumflex, and an occluded RPAV.  The mid LAD was successfully treated with a drug-eluting stent.  He continued to have intermittent chest discomfort following catheterization was placed on ranolazine with improvement.  At December 2022 follow-up, he reported a several week history of intermittent chest discomfort, different from prior angina.  He initially thought symptoms might have been related to a recent trial of sertraline however, when symptoms persisted despite discontinuing sertraline, we opted to perform diagnostic catheterization in January 2023, revealing severe mid RCA disease, which was stented with a 3.0 x 18 mm Onyx frontier drug-eluting stent.   He presented 5/31 urgently for UA and was set up for cardiac cath. Cath showed stable appearance of multivessel CAD since completion of cath/PCI in 02/2021 60% distal RCA stenosis is not hemodynamically significant (iFR =1.0), normal LVEF with mildly elevated filling pressures. Medical management was  recommended.   Today,    Past Medical History:  Diagnosis Date   Anxiety    Aortic atherosclerosis ([place])    CAD (coronary artery disease)    a. 12/2018 ETT: Ex time 9:39. 43m horiz ST dep in V4-V6 with abnormal coronary CTA. b. 02/2019 PCI: LM nl, LAD 60p/m, 935m3.0x38 Synergy XD DES), 40d, D2 90p, LCX 10043mCA 60m34md (FFR 0.8), RPDA 40, RPAV 100 CTO. EF 55%; c. 02/2021 PCI: LM nl, LAD patent stent, 40d, D2 90, LCX 30ost/p, 100m 79m OM1/2 20, RCA 20m (73m18 Onyx Frontier DES), 60d, RPAV 100 CTO (L->R collats). EF 55-65%.   Chest pain    Depression    GERD (gastroesophageal reflux disease)    History of echocardiogram    a. 12/2018 Echo: EF 60-65%, no rwma. Mildly dil LA.   Hyperlipidemia    Kidney stones     Past Surgical History:  Procedure Laterality Date   CHOLECYSTECTOMY     CORONARY ATHERECTOMY N/A 02/13/2019   Procedure: CORONARY ATHERECTOMY;  Surgeon: End, CNelva Roberts [facility];  Service: Cardiovascular;  Laterality: N/A;   CORONARY STENT INTERVENTION N/A 02/13/2019   Procedure: CORONARY STENT INTERVENTION;  Surgeon: End, CNelva Roberts [facility];  Service: Cardiovascular;  Laterality: N/A;   CORONARY STENT INTERVENTION N/A 03/03/2021   Procedure: CORONARY STENT INTERVENTION;  Surgeon: End, CNelva Roberts [facility];  Service: Cardiovascular;  Laterality: N/A;   INTRAVASCULAR PRESSURE WIRE/FFR STUDY N/A 02/13/2019   Procedure: INTRAVASCULAR PRESSURE WIRE/FFR STUDY;  Surgeon: End, CNelva Roberts [facility];  Service: Cardiovascular;  Laterality: N/A;  INTRAVASCULAR PRESSURE WIRE/FFR STUDY N/A 07/11/2021   Procedure: INTRAVASCULAR PRESSURE WIRE/FFR STUDY;  Surgeon: Marcus Bush, MD;  Location: [place] [facility];  Service: Cardiovascular;  Laterality: N/A;   INTRAVASCULAR ULTRASOUND/IVUS N/A 02/13/2019   Procedure: Intravascular Ultrasound/IVUS;  Surgeon: Marcus Bush, MD;  Location:  [place] [facility];  Service: Cardiovascular;  Laterality: N/A;   LEFT HEART CATH AND CORONARY ANGIOGRAPHY N/A 02/13/2019   Procedure: LEFT HEART CATH AND CORONARY ANGIOGRAPHY;  Surgeon: Marcus Bush, MD;  Location: [place] [facility];  Service: Cardiovascular;  Laterality: N/A;   LEFT HEART CATH AND CORONARY ANGIOGRAPHY Left 03/03/2021   Procedure: LEFT HEART CATH AND CORONARY ANGIOGRAPHY;  Surgeon: Marcus Bush, MD;  Location: [place] CV LAB;  Service: Cardiovascular;  Laterality: Left;   LEFT HEART CATH AND CORONARY ANGIOGRAPHY Left 07/11/2021   Procedure: LEFT HEART CATH AND CORONARY ANGIOGRAPHY;  Surgeon: Marcus Bush, MD;  Location: [place] [facility];  Service: Cardiovascular;  Laterality: Left;   vertical sleeve gastrectomy     2013 85% stomach removed was 280 lbs before surgery     Current Medications: No outpatient medications have been marked as taking for the 07/24/21 encounter (Appointment) with Marcus Roberts, Marcus Sermon H, PA-C.     Allergies:   Imdur [isosorbide nitrate] and Zetia [ezetimibe]   Social History   Socioeconomic History   Marital status: Married    Spouse name: Marcus Roberts   Number of children: 5   Years of education: Not on file   Highest education level: Not on file  Occupational History   Occupation: medical coding     Comment: [employer]  Tobacco Use   Smoking status: Never   Smokeless tobacco: Never  Vaping Use   Vaping Use: Never used  Substance and Sexual Activity   Alcohol use: Yes    Comment: occas. 2-3 x per month   Drug use: Never   Sexual activity: Yes    Partners: Female  Other Topics Concern   Not on file  Social History Narrative   Moved from [employer]    Social Determinants of Health   Financial Resource Strain: Not on file  Food Insecurity: Not on file  Transportation Needs: Not on file  Physical Activity: Not on file  Stress: Not on file  Social Connections: Not on file     Family History: The  patient's family history includes Diabetes in his father; Heart attack (age of onset: 18) in his brother; Heart attack (age of onset: 35) in his father; Heart disease in his brother and father; Hyperlipidemia in his father; Hypertension in his father.  ROS:   Please see the history of present illness.     All other systems reviewed and are negative.  EKGs/Labs/Other Studies Reviewed:    The following studies were reviewed today:  Cardiac cath 07/2021 Conclusion  Conclusions: Stable appearance of multivessel coronary artery disease since completion of cath/PCI in 02/2021.  60% distal RCA stenosis is not hemodynamically significant (iFR = 1.0). Normal left ventricular contraction (LVEF > 65%) with mildly elevated filling pressure (LVEDP 20 mmHg).   Recommendations: Continue aggressive secondary prevention, including long-term dual antiplatelet therapy, as well as antianginal therapy with amlodipine and ranolazine.   Marcus Bush, MD [facility]   Coronary Diagrams  Diagnostic Dominance: Right   EKG:  EKG is *** ordered today.  The ekg ordered today demonstrates ***  Recent Labs: 02/01/2021: TSH 1.870 06/20/2021: ALT 16; B Natriuretic Peptide 16.0 07/05/2021:  BUN 17; Creatinine, Ser 0.91; Hemoglobin 14.5; Platelets 213; Potassium 3.9; Sodium 141  Recent Lipid Panel    Component Value Date/Time   CHOL 124 06/20/2021 0858   CHOL 148 02/01/2021 1155   TRIG 110 06/20/2021 0858   HDL 45 06/20/2021 0858   HDL 61 02/01/2021 1155   CHOLHDL 2.8 06/20/2021 0858   VLDL 22 06/20/2021 0858   LDLCALC 57 06/20/2021 0858   LDLCALC 72 02/01/2021 1155     Risk Assessment/Calculations:   {Does this patient have ATRIAL FIBRILLATION?:[id number]}   Physical Exam:    VS:  There were no vitals taken for this visit.    Wt Readings from Last 3 Encounters:  07/11/21 220 lb (99.8 kg)  07/05/21 222 lb (100.7 kg)  06/20/21 222 lb (100.7 kg)     GEN: *** Well nourished, well developed  in no acute distress HEENT: Normal NECK: No JVD; No carotid bruits LYMPHATICS: No lymphadenopathy CARDIAC: ***RRR, no murmurs, rubs, gallops RESPIRATORY:  Clear to auscultation without rales, wheezing or rhonchi  ABDOMEN: Soft, non-tender, non-distended MUSCULOSKELETAL:  No edema; No deformity  SKIN: Warm and dry NEUROLOGIC:  Alert and oriented x 3 PSYCHIATRIC:  Normal affect   ASSESSMENT:    No diagnosis found. PLAN:    In order of problems listed above:  UA CAD  HLD  Disposition: Follow up {follow up:15908} with ***   Shared Decision Making/Informed Consent   {Are you ordering a CV Procedure (e.g. stress test, cath, DCCV, TEE, etc)?   Press F2        :[id number]}    Signed, Natalija Mavis Ninfa Meeker, PA-C  07/24/2021 8:07 AM    [place]

## 2021-07-25 ENCOUNTER — Other Ambulatory Visit: Payer: Self-pay | Admitting: Family

## 2021-07-25 ENCOUNTER — Encounter: Payer: Self-pay | Admitting: Medical

## 2021-07-25 DIAGNOSIS — F419 Anxiety disorder, unspecified: Secondary | ICD-10-CM

## 2021-08-16 ENCOUNTER — Encounter: Payer: Self-pay | Admitting: Internal Medicine

## 2021-08-16 NOTE — Telephone Encounter (Signed)
As long as Mr. Broers is not having any worsening symptoms (chest pain or shortness of breath, I think it would be reasonable for him to move forward with vasectomy after he has completed 6 months of dual antiplatelet therapy from the time of his most recent PCI in late January.  That would put any elective surgery into August.  Clopidogrel can be held for 5 days before the procedure and restarted as soon as possible afterwards.  Aspirin 81 mg daily must be continued in the perioperative period.  Nelva Bush, MD [facility]

## 2021-08-23 ENCOUNTER — Telehealth: Payer: Self-pay | Admitting: *Deleted

## 2021-08-23 NOTE — Telephone Encounter (Signed)
He  will have to continue aspirin he is at risk for postoperative bleeding/hematoma.  As long as he is willing to accept this increased risk can go ahead and schedule. Per Dr. Bernardo Heater   I talked with patient and he choice to go ahead with the vasectomy in the office. Patient is aware of the risk for postoperative bleeding/hematoma.      Clopidogrel can be held for 5 days before the procedure and restarted as soon as possible afterwards.  Aspirin 81 mg daily must be continued in the perioperative period.

## 2021-09-29 ENCOUNTER — Encounter: Payer: Self-pay | Admitting: Urology

## 2021-09-29 ENCOUNTER — Other Ambulatory Visit: Payer: Self-pay | Admitting: Internal Medicine

## 2021-09-29 ENCOUNTER — Ambulatory Visit (INDEPENDENT_AMBULATORY_CARE_PROVIDER_SITE_OTHER): Payer: 59 | Admitting: Urology

## 2021-09-29 VITALS — BP 122/79 | HR 78 | Ht 67.0 in | Wt 215.0 lb

## 2021-09-29 DIAGNOSIS — Z302 Encounter for sterilization: Secondary | ICD-10-CM | POA: Diagnosis not present

## 2021-09-29 DIAGNOSIS — Z9852 Vasectomy status: Secondary | ICD-10-CM

## 2021-09-29 MED ORDER — HYDROCODONE-ACETAMINOPHEN 5-325 MG PO TABS: 1.0000 | ORAL_TABLET | Freq: Four times a day (QID) | ORAL | 0 refills | Status: DC | PRN

## 2021-09-29 NOTE — Patient Instructions (Signed)

## 2021-09-29 NOTE — Progress Notes (Unsigned)
Vasectomy Procedure Note  Indications: Marcus Roberts is a 46 y.o. male who presents today for elective sterilization.  He has been consented for the procedure.  He is aware of the risks and benefits.  He had no additional questions.  He agrees to proceed.  He denies any other significant change since his last visit.  Pre-operative Diagnosis: Elective sterilization  Post-operative Diagnosis: Elective sterilization  Premedication: Valium 10 mg po  Surgeon: Nicki Reaper C. Olumide Dolinger, M.D  Description: The patient was prepped and draped in the standard fashion.  The right vas deferens was identified and brought superiorly to the anterior scrotal skin.  The skin and vas were then anesthetized utilizing *** ml 1% lidocaine.  A small stab incision was made and spread with the vas dissector.  The vas was grasped utilizing the vas clamp and elevated out of the incision.  The vas was dissected free from surrounding tissue and vessels and an ~1 cm segment was excised.  The vas lumens were cauterized utilizing electrocautery.  The distal segment was buried in the surrounding sheath with a 3-0 chromic suture.  No significant bleeding was observed.  The vas ends were then dropped back into the hemiscrotum.  The skin was closed with hemostatic pressure.  An identical procedure was performed on the contralateral side.  Clean dry gauze was applied to the incision sites.  The patient tolerated the procedure well.  Complications:None  Recommendations: 1.  No lifting greater than 10 pounds or strenuous activity for 1 week. 2.  Scrotal support for 1-2 weeks. 3.  May shower in 24 hours; no bath, hot tub for 1 week 4.  No intercourse for at least 7 days and resume based on level of discomfort  5.  Continue alternate contraception for 12 weeks.  6.  Call for significant pain, swelling, redness, drainage or fever greater than 100.5. 7.  Rx hydrocodone/APAP 5/325 1-2 every 6 hours prn pain. 8.  Follow-up semen analysis in  12 weeks.   John Giovanni, MD

## 2021-09-30 ENCOUNTER — Encounter: Payer: Self-pay | Admitting: Urology

## 2021-10-08 ENCOUNTER — Encounter: Payer: Self-pay | Admitting: Internal Medicine

## 2021-10-10 ENCOUNTER — Other Ambulatory Visit: Payer: Self-pay | Admitting: *Deleted

## 2021-10-10 MED ORDER — ROSUVASTATIN CALCIUM 40 MG PO TABS: 40.0000 mg | ORAL_TABLET | Freq: Every day | ORAL | 0 refills | Status: DC

## 2021-10-10 MED ORDER — CLOPIDOGREL BISULFATE 75 MG PO TABS: 75.0000 mg | ORAL_TABLET | Freq: Every day | ORAL | 0 refills | Status: DC

## 2021-10-10 MED ORDER — RANOLAZINE ER 1000 MG PO TB12: 1000.0000 mg | ORAL_TABLET | Freq: Two times a day (BID) | ORAL | 0 refills | Status: DC

## 2021-10-10 MED ORDER — FUROSEMIDE 20 MG PO TABS: 20.0000 mg | ORAL_TABLET | Freq: Every day | ORAL | 0 refills | Status: DC | PRN

## 2021-10-10 MED ORDER — AMLODIPINE BESYLATE 5 MG PO TABS: 5.0000 mg | ORAL_TABLET | Freq: Every day | ORAL | 0 refills | Status: DC

## 2021-10-26 ENCOUNTER — Other Ambulatory Visit: Payer: Self-pay | Admitting: Internal Medicine

## 2021-10-26 DIAGNOSIS — F419 Anxiety disorder, unspecified: Secondary | ICD-10-CM

## 2021-11-12 ENCOUNTER — Other Ambulatory Visit: Payer: Self-pay | Admitting: Internal Medicine

## 2021-11-26 IMAGING — CR DG CHEST 2V
2 series · 2 of 2 positions shown · non-contrast
Comparison: 12/08/2018

CLINICAL DATA: Shortness of breath, abnormal breath sounds

EXAM:
CHEST - 2 VIEW

[chest pa]
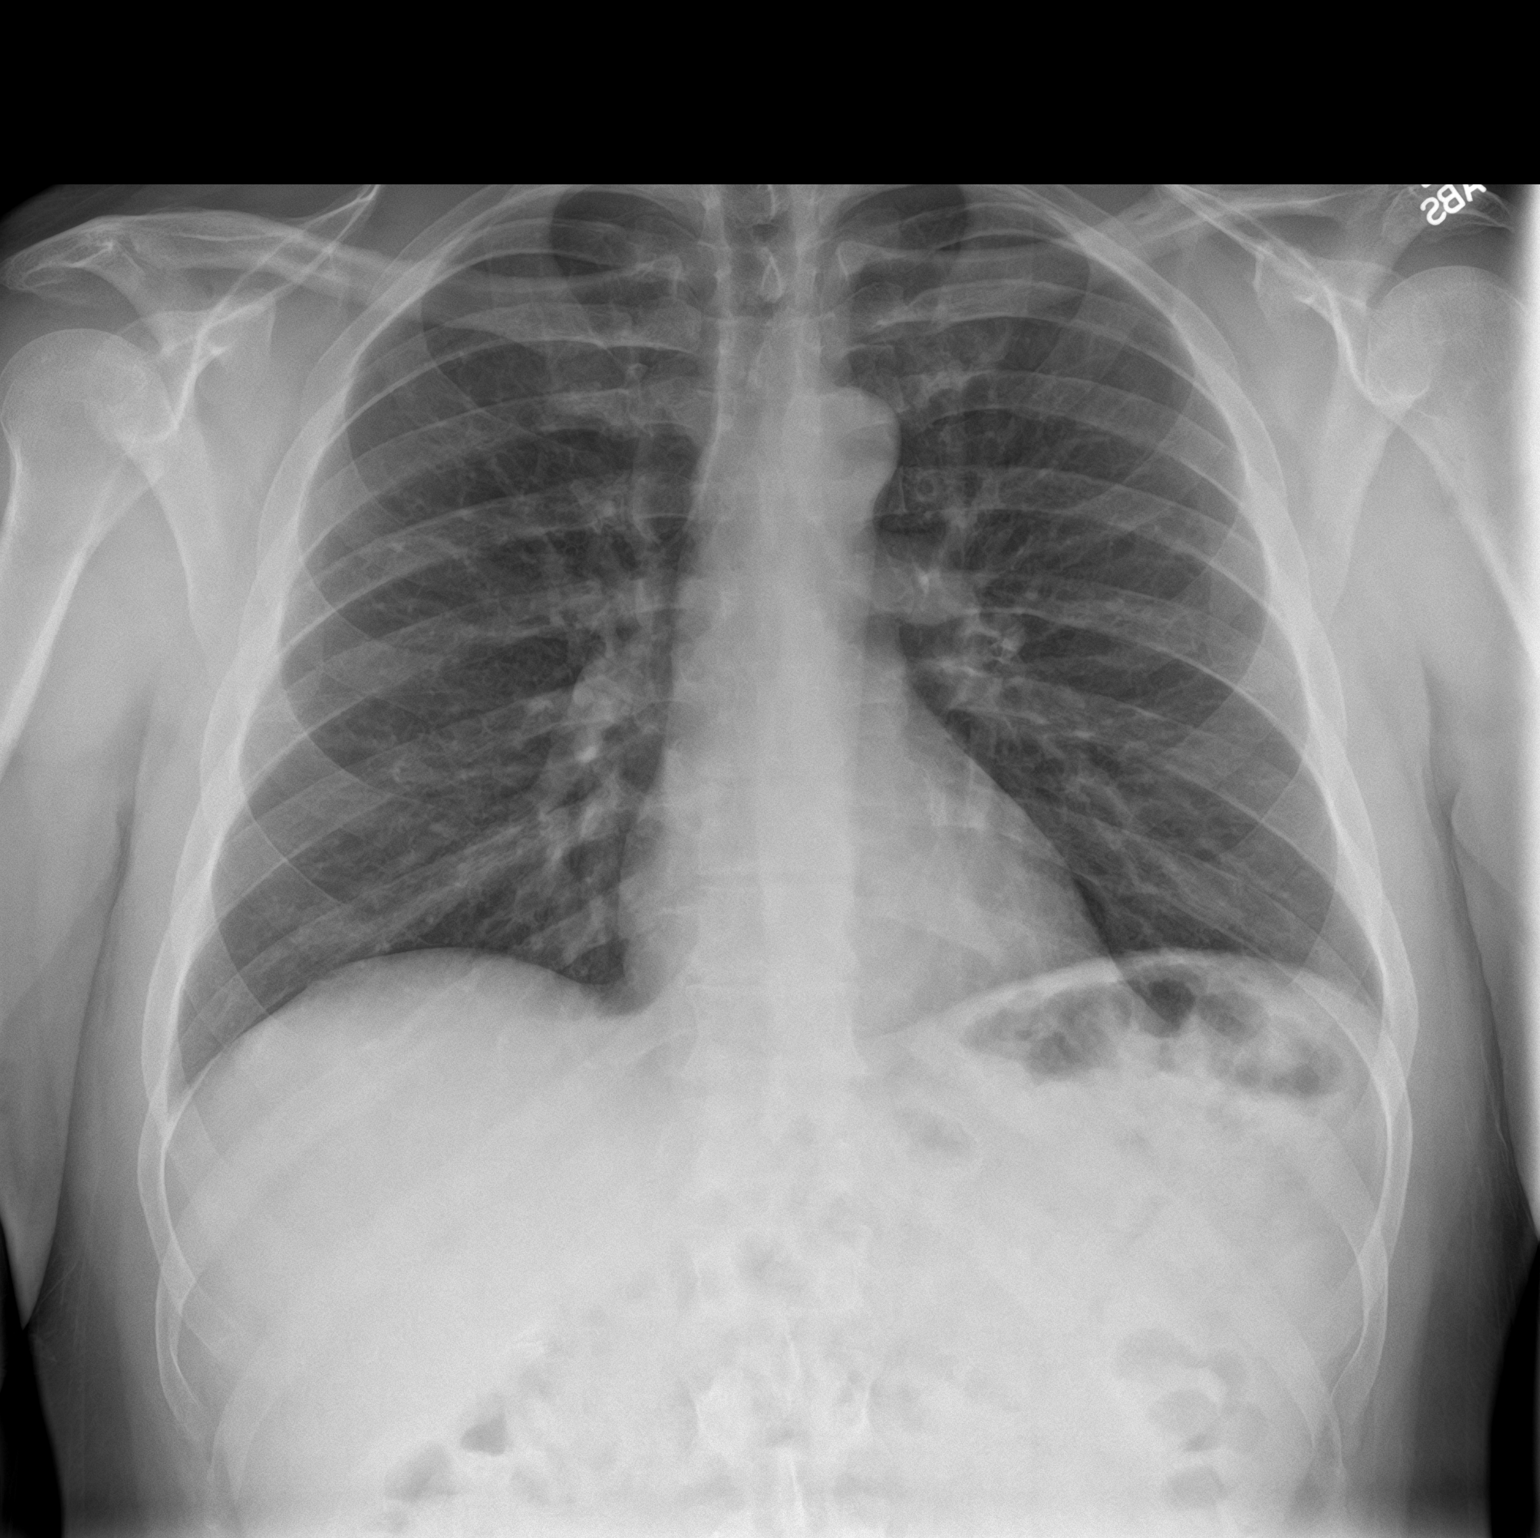

[chest lat]
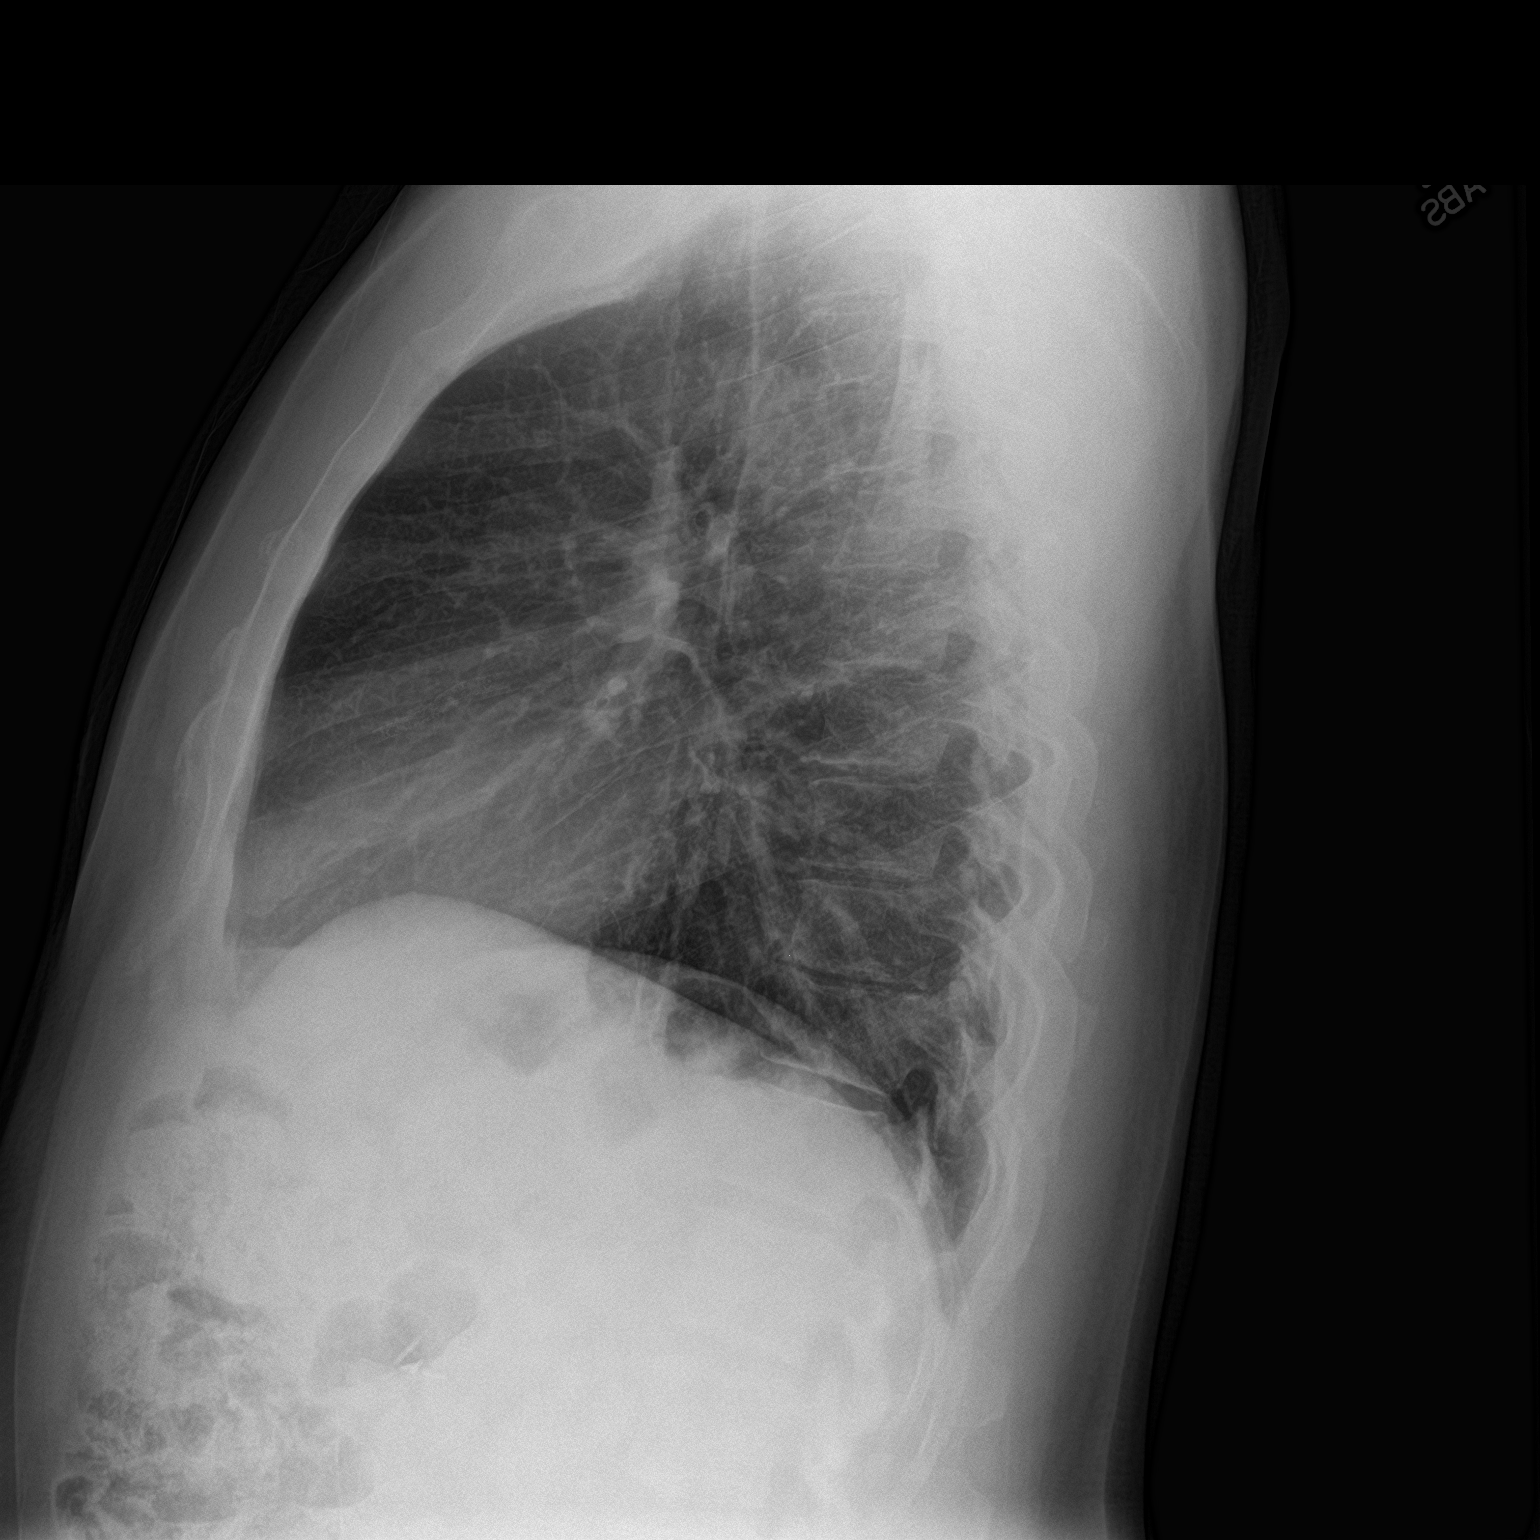

[2 of 2 positions shown; findings below may reference images not displayed]

FINDINGS: The heart size and mediastinal contours are within normal limits.
Both lungs are clear. The visualized skeletal structures are
unremarkable.
IMPRESSION: No active cardiopulmonary disease.

## 2021-12-21 ENCOUNTER — Ambulatory Visit: Payer: Self-pay | Admitting: Internal Medicine

## 2022-01-01 ENCOUNTER — Other Ambulatory Visit: Payer: 59

## 2022-01-02 ENCOUNTER — Other Ambulatory Visit: Payer: Self-pay | Admitting: Internal Medicine

## 2022-01-02 NOTE — Telephone Encounter (Signed)
LVM to schedule appt, please schedule 

## 2022-01-02 NOTE — Telephone Encounter (Signed)
Needs office visit for further refills. Thank you!

## 2022-01-11 ENCOUNTER — Other Ambulatory Visit: Payer: Self-pay | Admitting: Internal Medicine

## 2022-01-11 NOTE — Telephone Encounter (Signed)
Please contact pt for future appointment. Pt overdue for 3 wk f/u. Pt needing refills.

## 2022-01-12 ENCOUNTER — Telehealth: Payer: Self-pay | Admitting: Internal Medicine

## 2022-01-12 NOTE — Telephone Encounter (Signed)
There are no direct equivalents to ranolazine.  Other antianginal treatments include beta blocker, long-acting, nitrates, and calcium channel blockers.  Unfortunately, Marcus Roberts has been intolerant of beta blockers and long acting nitrates in the past.  He is also already on a calcium channel blocker (amlodipine).  I suggest that he try a different pharmacy or utilize [facility] (a 54-monthsupply of ranolazine 1000 mg BID is available for as little as $56.88 at select pharmacies with [facility]).  If he is unable to procure ranolazine and has recrudescence of chest pain, we could rechallenge him with a beta blocker or long-acting nitrate to see if the side-effects are more tolerable at this time.  Marcus Bush MD [facility]

## 2022-01-12 NOTE — Telephone Encounter (Signed)
LVM to schedule appt. Please schedule.

## 2022-01-12 NOTE — Telephone Encounter (Signed)
Pt c/o medication issue:  1. Name of Medication: ranolazine (RANEXA) 1000 MG SR tablet  2. How are you currently taking this medication (dosage and times per day)?   3. Are you having a reaction (difficulty breathing--STAT)?   4. What is your medication issue?  Pt is calling to see if there are other options for this medication. He is being told by pharmacy this will be over $700 for him to get. Please advise.

## 2022-01-12 NOTE — Telephone Encounter (Signed)
Pt updated with MD's recommendations and voiced he will try [facility].

## 2022-02-11 ENCOUNTER — Encounter: Payer: Self-pay | Admitting: Internal Medicine

## 2022-02-12 MED ORDER — RANOLAZINE ER 1000 MG PO TB12: 1000.0000 mg | ORAL_TABLET | Freq: Two times a day (BID) | ORAL | 0 refills | Status: DC

## 2022-02-12 MED ORDER — CLOPIDOGREL BISULFATE 75 MG PO TABS: 75.0000 mg | ORAL_TABLET | Freq: Every day | ORAL | 0 refills | Status: DC

## 2022-02-12 MED ORDER — ROSUVASTATIN CALCIUM 40 MG PO TABS: 40.0000 mg | ORAL_TABLET | Freq: Every day | ORAL | 0 refills | Status: DC

## 2022-02-12 MED ORDER — FUROSEMIDE 20 MG PO TABS: 20.0000 mg | ORAL_TABLET | Freq: Every day | ORAL | 0 refills | Status: DC | PRN

## 2022-02-12 MED ORDER — AMLODIPINE BESYLATE 5 MG PO TABS: 5.0000 mg | ORAL_TABLET | Freq: Every day | ORAL | 0 refills | Status: DC

## 2022-02-20 ENCOUNTER — Encounter: Payer: Self-pay | Admitting: Urology

## 2022-02-20 ENCOUNTER — Other Ambulatory Visit: Payer: Self-pay | Admitting: *Deleted

## 2022-02-23 ENCOUNTER — Encounter: Payer: Self-pay | Admitting: *Deleted

## 2022-03-13 ENCOUNTER — Other Ambulatory Visit: Payer: Self-pay | Admitting: Internal Medicine

## 2022-03-13 NOTE — Telephone Encounter (Signed)
Please refill at [facility] 03-15-22

## 2022-03-15 ENCOUNTER — Encounter: Payer: Self-pay | Admitting: Internal Medicine

## 2022-03-15 ENCOUNTER — Ambulatory Visit: Payer: PRIVATE HEALTH INSURANCE | Attending: Internal Medicine | Admitting: Internal Medicine

## 2022-03-15 VITALS — BP 108/78 | HR 79 | Ht 67.0 in | Wt 209.0 lb

## 2022-03-15 DIAGNOSIS — I25118 Atherosclerotic heart disease of native coronary artery with other forms of angina pectoris: Secondary | ICD-10-CM

## 2022-03-15 DIAGNOSIS — E785 Hyperlipidemia, unspecified: Secondary | ICD-10-CM | POA: Diagnosis not present

## 2022-03-15 MED ORDER — NITROGLYCERIN 0.4 MG SL SUBL: 0.4000 mg | SUBLINGUAL_TABLET | SUBLINGUAL | 3 refills | Status: DC | PRN

## 2022-03-15 NOTE — Progress Notes (Signed)
Follow-up Outpatient Visit Date: 03/15/2022  Primary Care Provider: Patient, No Pcp Per No address on file  Chief Complaint: Follow-up coronary artery disease  HPI:  Marcus Roberts is a 47 y.o. male with history of coronary artery disease, hyperlipidemia, GERD, obesity status post gastric sleeve, depression, and nephrolithiasis, who presents for follow-up of coronary artery disease.  I last saw him in late May, at which time he was complaining of right-sided neck pain and shortness of breath reminiscent of what he is felt in the past with his CAD.  Subsequent cath showed stable multivessel CAD with 60% distal RCA stenosis that was not hemodynamically significant by iFR.  Today, Marcus Roberts repots that he has been feeling quite well.  He notes that he ran out of ranolazine in 01/2022 and did not refill it until last month due to cost constraints.  He did not feel any different while off ranolazine.  He denies chest pain, shortness of breath, palpitations, lightheadedness, and edema.  He has not needed his as needed furosemide.  He is exercising regularly without limitations.  --------------------------------------------------------------------------------------------------  Past Medical History:  Diagnosis Date   Anxiety    Aortic atherosclerosis ([facility])    CAD (coronary artery disease)    a. 12/2018 ETT: Ex time 9:39. 90m horiz ST dep in V4-V6 with abnormal coronary CTA. b. 02/2019 PCI: LM nl, LAD 60p/m, 964m3.0x38 Synergy XD DES), 40d, D2 90p, LCX 10013mCA 38m61md (FFR 0.8), RPDA 40, RPAV 100 CTO. EF 55%; c. 02/2021 PCI: LM nl, LAD patent stent, 40d, D2 90, LCX 30ost/p, 100m 91m OM1/2 20, RCA 24m (88m18 Onyx Frontier DES), 60d, RPAV 100 CTO (L->R collats). EF 55-65%.   Chest pain    Depression    GERD (gastroesophageal reflux disease)    History of echocardiogram    a. 12/2018 Echo: EF 60-65%, no rwma. Mildly dil LA.   Hyperlipidemia    Kidney stones    Past Surgical History:  Procedure  Laterality Date   CHOLECYSTECTOMY     CORONARY ATHERECTOMY N/A 02/13/2019   Procedure: CORONARY ATHERECTOMY;  Surgeon: Abdullah Rizzi, CNelva Bush [facility];  Service: Cardiovascular;  Laterality: N/A;   CORONARY STENT INTERVENTION N/A 02/13/2019   Procedure: CORONARY STENT INTERVENTION;  Surgeon: Jonah Gingras, CNelva Bush [facility];  Service: Cardiovascular;  Laterality: N/A;   CORONARY STENT INTERVENTION N/A 03/03/2021   Procedure: CORONARY STENT INTERVENTION;  Surgeon: Iya Hamed, CNelva Bush [facility];  Service: Cardiovascular;  Laterality: N/A;   INTRAVASCULAR PRESSURE WIRE/FFR STUDY N/A 02/13/2019   Procedure: INTRAVASCULAR PRESSURE WIRE/FFR STUDY;  Surgeon: Takira Sherrin, CNelva Bush [facility];  Service: Cardiovascular;  Laterality: N/A;   INTRAVASCULAR PRESSURE WIRE/FFR STUDY N/A 07/11/2021   Procedure: INTRAVASCULAR PRESSURE WIRE/FFR STUDY;  Surgeon: Omran Keelin, CNelva Bush [facility];  Service: Cardiovascular;  Laterality: N/A;   INTRAVASCULAR ULTRASOUND/IVUS N/A 02/13/2019   Procedure: Intravascular Ultrasound/IVUS;  Surgeon: Clarity Ciszek, CNelva Bush [facility];  Service: Cardiovascular;  Laterality: N/A;   LEFT HEART CATH AND CORONARY ANGIOGRAPHY N/A 02/13/2019   Procedure: LEFT HEART CATH AND CORONARY ANGIOGRAPHY;  Surgeon: Mccrae Speciale, CNelva Bush [facility];  Service: Cardiovascular;  Laterality: N/A;   LEFT HEART CATH AND CORONARY ANGIOGRAPHY Left 03/03/2021   Procedure: LEFT HEART CATH AND CORONARY ANGIOGRAPHY;  Surgeon: Johann Gascoigne, CNelva Bush [facility];  Service: Cardiovascular;  Laterality: Left;  LEFT HEART CATH AND CORONARY ANGIOGRAPHY Left 07/11/2021   Procedure: LEFT HEART CATH AND CORONARY ANGIOGRAPHY;  Surgeon: Nelva Bush, MD;  Location: [place] [facility];  Service: Cardiovascular;  Laterality: Left;   VASECTOMY     vertical  sleeve gastrectomy     2013 85% stomach removed was 280 lbs before surgery     Current Meds  Medication Sig   amLODipine (NORVASC) 5 MG tablet Take 1 tablet (5 mg total) by mouth daily.   aspirin EC 81 MG tablet Take 81 mg by mouth daily.    Biotin 10000 MCG TABS Take 10,000 mcg by mouth in the morning.   buPROPion (WELLBUTRIN XL) 150 MG 24 hr tablet TAKE 1 TABLET (150 MG TOTAL) BY MOUTH IN THE MORNING.   Cholecalciferol (VITAMIN D3) 125 MCG (5000 UT) TABS Take 5,000 Units by mouth in the morning.   clopidogrel (PLAVIX) 75 MG tablet Take 1 tablet (75 mg total) by mouth daily.   cyclobenzaprine (FLEXERIL) 10 MG tablet TAKE 1 TABLET BY MOUTH AT BEDTIME AS NEEDED FOR MUSCLE SPASMS   fluticasone (FLONASE) 50 MCG/ACT nasal spray Place 1 spray into both nostrils in the morning and at bedtime.   furosemide (LASIX) 20 MG tablet Take 1 tablet (20 mg total) by mouth daily as needed.   HYDROcodone-acetaminophen (NORCO/VICODIN) 5-325 MG tablet Take 1 tablet by mouth every 6 (six) hours as needed for moderate pain.   LORazepam (ATIVAN) 1 MG tablet TAKE 1 TABLET BY MOUTH EVERY DAY AS NEEDED FOR ANXIETY   Multiple Vitamin (MULTIVITAMIN WITH MINERALS) TABS tablet Take 1 tablet by mouth 2 (two) times a week.   nitroGLYCERIN (NITROSTAT) 0.4 MG SL tablet Place 1 tablet (0.4 mg total) under the tongue every 5 (five) minutes as needed for chest pain.   pantoprazole (PROTONIX) 40 MG tablet TAKE 1 TABLET (40 MG TOTAL) BY MOUTH DAILY. 30 MIN BEFORE BREAKFAST OR DINNER *STOP NEXIUM   ranolazine (RANEXA) 1000 MG SR tablet Take 1 tablet (1,000 mg total) by mouth 2 (two) times daily.   rosuvastatin (CRESTOR) 40 MG tablet Take 1 tablet (40 mg total) by mouth daily.   tadalafil (CIALIS) 20 MG tablet 1 tab 1 hour prior to intercourse.  Do not take nitroglycerin within 36 hours of taking tadalafil   tetrahydrozoline 0.05 % ophthalmic solution Place 1 drop into both eyes as needed.    Allergies: Imdur [isosorbide nitrate]  and Zetia [ezetimibe]  Social History   Tobacco Use   Smoking status: Never   Smokeless tobacco: Never  Vaping Use   Vaping Use: Never used  Substance Use Topics   Alcohol use: Yes    Comment: occas. 2-3 x per month   Drug use: Never    Family History  Problem Relation Age of Onset   Diabetes Father    Heart disease Father        x2   Hyperlipidemia Father    Hypertension Father    Heart attack Father 83       x2; CABG x 3 11/2019   Heart disease Brother        MI in 80s   Heart attack Brother 40    Review of Systems: A 12-system review of systems was performed and was negative except as noted in the HPI.  --------------------------------------------------------------------------------------------------  Physical Exam: BP 108/78 (BP Location: Left Arm, Patient Position: Sitting, Cuff Size: Large)   Pulse 79   Ht '5\' 7"'$  (1.702 m)   Wt 209 lb (94.8  kg)   SpO2 99%   BMI 32.73 kg/m   General:  NAD. Neck: No JVD or HJR. Lungs: Clear to auscultation bilaterally without wheezes or crackles. Heart: Regular rate and rhythm without murmurs, rubs, or gallops. Abdomen: Soft, nontender, nondistended. Extremities: No lower extremity edema.  EKG:  Normal sinus rhythm without abnormalities.  Lab Results  Component Value Date   WBC 6.4 07/05/2021   HGB 14.5 07/05/2021   HCT 42.0 07/05/2021   MCV 90.7 07/05/2021   PLT 213 07/05/2021    Lab Results  Component Value Date   NA 141 07/05/2021   K 3.9 07/05/2021   CL 104 07/05/2021   CO2 29 07/05/2021   BUN 17 07/05/2021   CREATININE 0.91 07/05/2021   GLUCOSE 115 (H) 07/05/2021   ALT 16 06/20/2021    Lab Results  Component Value Date   CHOL 124 06/20/2021   HDL 45 06/20/2021   LDLCALC 57 06/20/2021   TRIG 110 06/20/2021   CHOLHDL 2.8 06/20/2021    --------------------------------------------------------------------------------------------------  ASSESSMENT AND PLAN: Coronary artery disease with stable  angina: Mr. Sjogren feels well and did not have any recrudescence of his chest pain while off ranolazine in 01/2022.  We have agreed to stop ranolazine and continue antianginal therapy with amlodipine alone.  We have agreed to continue long-term DAPT with aspirin and clopidogrel, as tolerated.  Hyperlipidemia: Most recent lipid panel in 06/2021 notable for excellent lipid profile.  However, Lp(a) was noted to be quite high when checked in 07/2021.  We will continue rosuvastatin 40 mg daily for secondary prevention.  Await further clinical trials aimed at treatment of Lp(a).  Follow-up: Return to clinic in 6 months.  Nelva Bush, MD 03/15/2022 8:39 AM

## 2022-03-15 NOTE — Patient Instructions (Signed)
Medication Instructions:  Your physician recommends the following medication changes.  STOP TAKING: Ranexa    *If you need a refill on your cardiac medications before your next appointment, please call your pharmacy*   Lab Work: None ordered today   Testing/Procedures: None ordered today   Follow-Up: At [facility], you and your health needs are our priority.  As part of our continuing mission to provide you with exceptional heart care, we have created designated Provider Care Teams.  These Care Teams include your primary Cardiologist (physician) and Advanced Practice Providers (APPs -  Physician Assistants and Nurse Practitioners) who all work together to provide you with the care you need, when you need it.  We recommend signing up for the patient portal called "MyChart".  Sign up information is provided on this After Visit Summary.  MyChart is used to connect with patients for Virtual Visits (Telemedicine).  Patients are able to view lab/test results, encounter notes, upcoming appointments, etc.  Non-urgent messages can be sent to your provider as well.   To learn more about what you can do with MyChart, go to [email or web address].    Your next appointment:   6 month(s)  Provider:   You may see Nelva Bush, MD or one of the following Advanced Practice Providers on your designated Care Team:   Murray Hodgkins, NP Christell Faith, PA-C Cadence Kathlen Mody, PA-C Gerrie Nordmann, NP

## 2022-04-05 ENCOUNTER — Other Ambulatory Visit: Payer: Self-pay | Admitting: Internal Medicine

## 2022-04-30 ENCOUNTER — Encounter: Payer: Self-pay | Admitting: Internal Medicine

## 2022-04-30 MED ORDER — ROSUVASTATIN CALCIUM 40 MG PO TABS: 40.0000 mg | ORAL_TABLET | Freq: Every day | ORAL | 3 refills | Status: DC

## 2022-05-30 ENCOUNTER — Ambulatory Visit: Payer: PRIVATE HEALTH INSURANCE | Admitting: Family Medicine

## 2022-05-30 ENCOUNTER — Encounter: Payer: Self-pay | Admitting: Family Medicine

## 2022-05-30 VITALS — BP 120/78 | HR 69 | Temp 98.0°F | Ht 67.72 in | Wt 211.6 lb

## 2022-05-30 DIAGNOSIS — Z7689 Persons encountering health services in other specified circumstances: Secondary | ICD-10-CM

## 2022-05-30 DIAGNOSIS — B001 Herpesviral vesicular dermatitis: Secondary | ICD-10-CM | POA: Diagnosis not present

## 2022-05-30 DIAGNOSIS — F419 Anxiety disorder, unspecified: Secondary | ICD-10-CM

## 2022-05-30 DIAGNOSIS — F32A Depression, unspecified: Secondary | ICD-10-CM

## 2022-05-30 DIAGNOSIS — Z955 Presence of coronary angioplasty implant and graft: Secondary | ICD-10-CM | POA: Insufficient documentation

## 2022-05-30 MED ORDER — BUPROPION HCL ER (XL) 150 MG PO TB24: 150.0000 mg | ORAL_TABLET | Freq: Every morning | ORAL | 1 refills | Status: DC

## 2022-05-30 MED ORDER — LORAZEPAM 1 MG PO TABS: 0.5000 mg | ORAL_TABLET | Freq: Every day | ORAL | 1 refills | Status: DC | PRN

## 2022-05-30 MED ORDER — VALACYCLOVIR HCL 1 G PO TABS: ORAL_TABLET | ORAL | 0 refills | Status: AC

## 2022-05-30 NOTE — Progress Notes (Signed)
Patient ID: Marcus Roberts, male  DOB: Apr 01, 1975, 47 y.o.   MRN: [id number] Patient Care Team    Relationship Specialty Notifications Start End  Natalia Leatherwood, DO PCP - General Family Medicine  05/30/22   End, Cristal Deer, MD Consulting Physician Cardiology  05/30/18   Riki Altes, MD  Urology  05/30/22     Chief Complaint  Patient presents with   Anxiety   Depression    TOC    Subjective: Marcus Roberts is a 47 y.o.  male present for TOC to new provider in [place]. All past medical history, surgical history, allergies, family history, immunizations, medications and social history were updated in the electronic medical record today. All recent labs, ED visits and hospitalizations within the last year were reviewed.  Anxiety/depression: Patient reports he has been prescribed Ativan 0.5-1 mg as needed daily.  He reports there are many days he does not take the medication at all, but there are some days in which he may need to take a full 1 mg for his anxiety.  He reports he was started on Wellbutrin 150 mg XL 6 months ago.  He has been receiving this medication through an online provider.  He would like to get everything switched over to PCP today.  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  Patient has a history of recurrent cold sores.  He reports he has been prescribed acyclovir half a tab daily.  There was some confusion if he was positive for HSV 2, however EMR review today showed that this was negative.  Patient reports he would get about 3 cold sores a year typically.     06/07/2021    2:59 PM 10/25/2020   12:04 PM 03/12/2019    9:10 AM 12/04/2017    2:55 PM 06/12/2017   11:01 AM  Depression screen PHQ 2/9  Decreased Interest 0 3 2 2 1   Down, Depressed, Hopeless 1 3 2 1 2   PHQ - 2 Score 1 6 4 3 3   Altered sleeping  3 2 0 0  Tired, decreased energy  2 2 1 2   Change in appetite  3 2 0 2  Feeling bad or failure about yourself   3 3 1 3   Trouble concentrating  3 2 0 1   Moving slowly or fidgety/restless  2 1 0 1  Suicidal thoughts  0 0 0 1  PHQ-9 Score  22 16 5 13   Difficult doing work/chores  Very difficult Somewhat difficult Not difficult at all Somewhat difficult      10/25/2020   12:05 PM 12/04/2017    2:56 PM  GAD 7 : Generalized Anxiety Score  Nervous, Anxious, on Edge 3 3  Control/stop worrying 3 2  Worry too much - different things 3 2  Trouble relaxing 1 3  Restless 1 1  Easily annoyed or irritable 1 1  Afraid - awful might happen 3 1  Total GAD 7 Score 15 13  Anxiety Difficulty Very difficult Not difficult at all           06/07/2021    2:59 PM 10/25/2020   12:04 PM 12/24/2019    2:35 PM 03/11/2019    2:39 PM 12/04/2017    2:55 PM  Fall Risk   Falls in the past year? 0 0 0 0 No  Number falls in past yr: 0 0 0 0   Injury with Fall? 0 0 0    Risk for fall due  to : No Fall Risks   No Fall Risks   Follow up Falls evaluation completed Falls evaluation completed Falls evaluation completed       Immunization History  Administered Date(s) Administered   Influenza,inj,Quad PF,6+ Mos 11/21/2016, 10/21/2017, 11/25/2019, 10/25/2020, 11/17/2021   PFIZER(Purple Top)SARS-COV-2 Vaccination 09/03/2019, 09/24/2019   Tdap 06/12/2017    No results found.  Past Medical History:  Diagnosis Date   Anxiety    Aortic atherosclerosis    CAD (coronary artery disease)    a. 12/2018 ETT: Ex time 9:39. 2mm horiz ST dep in V4-V6 with abnormal coronary CTA. b. 02/2019 PCI: LM nl, LAD 60p/m, 69m (3.0x38 Synergy XD DES), 40d, D2 90p, LCX 141m, RCA 57m, 60d (FFR 0.8), RPDA 40, RPAV 100 CTO. EF 55%; c. 02/2021 PCI: LM nl, LAD patent stent, 40d, D2 90, LCX 30ost/p, 161m CTO, OM1/2 20, RCA 72m (3.0x18 Onyx Frontier DES), 60d, RPAV 100 CTO (L->R collats). EF 55-65%.   Cervicalgia 12/24/2019   Chest pain    Depression    Elevated troponin 12/11/2018   GERD (gastroesophageal reflux disease)    History of echocardiogram    a. 12/2018 Echo: EF 60-65%, no rwma.  Mildly dil LA.   Hyperlipidemia    Kidney stones    Multiple nevi 06/12/2017   Allergies  Allergen Reactions   Imdur [Isosorbide Nitrate]     Did not tolerate due to headache   Zetia [Ezetimibe] Other (See Comments)    Head pressure/headaches Chest discomfort   Past Surgical History:  Procedure Laterality Date   CHOLECYSTECTOMY  2013   CORONARY ATHERECTOMY N/A 02/13/2019   Procedure: CORONARY ATHERECTOMY;  Surgeon: Yvonne Kendall, MD;  [facility];  Service: Cardiovascular;  Laterality: N/A;   CORONARY PRESSURE/FFR STUDY N/A 02/13/2019   Procedure: INTRAVASCULAR PRESSURE WIRE/FFR STUDY;  Surgeon: Yvonne Kendall, MD;  [facility];  Service: Cardiovascular;  Laterality: N/A;   CORONARY PRESSURE/FFR STUDY N/A 07/11/2021   Procedure: INTRAVASCULAR PRESSURE WIRE/FFR STUDY;  Surgeon: Yvonne Kendall, MD;  Location: [facility];  Service: Cardiovascular;  Laterality: N/A;   CORONARY STENT INTERVENTION N/A 02/13/2019   Procedure: CORONARY STENT INTERVENTION;  Surgeon: Yvonne Kendall, MD;  [facility];  Service: Cardiovascular;  Laterality: N/A;   CORONARY STENT INTERVENTION N/A 03/03/2021   Procedure: CORONARY STENT INTERVENTION;  Surgeon: Yvonne Kendall, MD;  Location: [facility];  Service: Cardiovascular;  Laterality: N/A;   CORONARY ULTRASOUND/IVUS N/A 02/13/2019   Procedure: Intravascular Ultrasound/IVUS;  Surgeon: Yvonne Kendall, MD;  [facility];  Service: Cardiovascular;  Laterality: N/A;   LEFT HEART CATH AND CORONARY ANGIOGRAPHY N/A 02/13/2019   Procedure: LEFT HEART CATH AND CORONARY ANGIOGRAPHY;  Surgeon: Yvonne Kendall, MD;  [facility];  Service: Cardiovascular;  Laterality: N/A;   LEFT HEART CATH AND CORONARY ANGIOGRAPHY Left 03/03/2021   Procedure: LEFT HEART CATH AND CORONARY ANGIOGRAPHY;  Surgeon: Yvonne Kendall, MD;  Location: [facility];  Service:  Cardiovascular;  Laterality: Left;   LEFT HEART CATH AND CORONARY ANGIOGRAPHY Left 07/11/2021   Procedure: LEFT HEART CATH AND CORONARY ANGIOGRAPHY;  Surgeon: Yvonne Kendall, MD;  Location: [facility];  Service: Cardiovascular;  Laterality: Left;   VASECTOMY     vertical sleeve gastrectomy     2013 85% stomach removed was 280 lbs before surgery    Family History  Problem Relation Age of Onset   Diabetes Father    Heart disease  Father        x2   Hyperlipidemia Father    Hypertension Father    Heart attack Father 71       x2; CABG x 3 11/2019   Heart disease Brother        MI in 84s   Heart attack Brother 36   Social History   Social History Narrative   Marital status/children/pets: divorced. From [place].    Education/employment: works as a Firefighter:      -smoke alarm in the home:Yes     - wears seatbelt: Yes     - Feels safe in their relationships: Yes          Allergies as of 05/30/2022       Reactions   Imdur [isosorbide Nitrate]    Did not tolerate due to headache   Zetia [ezetimibe] Other (See Comments)   Head pressure/headaches Chest discomfort        Medication List        Accurate as of May 30, 2022  2:47 PM. If you have any questions, ask your nurse or doctor.          STOP taking these medications    cyclobenzaprine 10 MG tablet Commonly known as: FLEXERIL Stopped by: Felix Pacini, DO   fluticasone 50 MCG/ACT nasal spray Commonly known as: FLONASE Stopped by: Felix Pacini, DO   HYDROcodone-acetaminophen 5-325 MG tablet Commonly known as: NORCO/VICODIN Stopped by: Felix Pacini, DO   multivitamin with minerals Tabs tablet Stopped by: Felix Pacini, DO   pantoprazole 40 MG tablet Commonly known as: PROTONIX Stopped by: Felix Pacini, DO   tetrahydrozoline 0.05 % ophthalmic solution Stopped by: Felix Pacini, DO       TAKE these medications    amLODipine 5 MG tablet Commonly known as: NORVASC TAKE 1  TABLET (5 MG TOTAL) BY MOUTH DAILY.   aspirin EC 81 MG tablet Take 81 mg by mouth daily.   Biotin 16109 MCG Tabs Take 10,000 mcg by mouth in the morning.   buPROPion 150 MG 24 hr tablet Commonly known as: WELLBUTRIN XL Take 1 tablet (150 mg total) by mouth in the morning.   clopidogrel 75 MG tablet Commonly known as: PLAVIX Take 1 tablet (75 mg total) by mouth daily.   furosemide 20 MG tablet Commonly known as: LASIX TAKE 1 TABLET BY MOUTH EVERY DAY AS NEEDED   LORazepam 1 MG tablet Commonly known as: ATIVAN TAKE 1 TABLET BY MOUTH EVERY DAY AS NEEDED FOR ANXIETY What changed: Another medication with the same name was added. Make sure you understand how and when to take each. Changed by: Felix Pacini, DO   LORazepam 1 MG tablet Commonly known as: ATIVAN Take 0.5-1 tablets (0.5-1 mg total) by mouth daily as needed for anxiety. What changed: You were already taking a medication with the same name, and this prescription was added. Make sure you understand how and when to take each. Changed by: Felix Pacini, DO   nitroGLYCERIN 0.4 MG SL tablet Commonly known as: NITROSTAT Place 1 tablet (0.4 mg total) under the tongue every 5 (five) minutes as needed for chest pain.   rosuvastatin 40 MG tablet Commonly known as: CRESTOR Take 1 tablet (40 mg total) by mouth daily.   tadalafil 20 MG tablet Commonly known as: CIALIS 1 tab 1 hour prior to intercourse.  Do not take nitroglycerin within 36 hours of taking tadalafil   valACYclovir 1000 MG tablet Commonly known as:  VALTREX 2 tabs PO at onset of symptoms, repeat dose once in 12 hours Started by: Felix Pacini, DO   Vitamin D3 125 MCG (5000 UT) Tabs Take 5,000 Units by mouth in the morning.        All past medical history, surgical history, allergies, family history, immunizations andmedications were updated in the EMR today and reviewed under the history and medication portions of their EMR.    No results found for this or  any previous visit (from the past 2160 hour(s)).   ROS 14 pt review of systems performed and negative (unless mentioned in an HPI)  Objective: BP 120/78   Pulse 69   Temp 98 F (36.7 C)   Ht 5' 7.72" (1.72 m)   Wt 211 lb 9.6 oz (96 kg)   SpO2 97%   BMI 32.44 kg/m  Physical Exam Vitals and nursing note reviewed.  Constitutional:      General: He is not in acute distress.    Appearance: Normal appearance. He is not ill-appearing, toxic-appearing or diaphoretic.  HENT:     Head: Normocephalic and atraumatic.  Eyes:     General: No scleral icterus.       Right eye: No discharge.        Left eye: No discharge.     Extraocular Movements: Extraocular movements intact.     Pupils: Pupils are equal, round, and reactive to light.  Cardiovascular:     Rate and Rhythm: Normal rate and regular rhythm.  Pulmonary:     Effort: Pulmonary effort is normal. No respiratory distress.     Breath sounds: Normal breath sounds. No wheezing, rhonchi or rales.  Musculoskeletal:     Right lower leg: No edema.     Left lower leg: No edema.  Skin:    General: Skin is warm.     Findings: No rash.  Neurological:     Mental Status: He is alert and oriented to person, place, and time. Mental status is at baseline.  Psychiatric:        Mood and Affect: Mood normal.        Behavior: Behavior normal.        Thought Content: Thought content normal.        Judgment: Judgment normal.       Assessment/plan: Marcus Roberts is a 47 y.o. male present for TOC-chronic condition management Establishing care with new doctor, encounter for Anxiety and depression Patient feels current regimen is working well for him. Continue Ativan 0.5-1 mg daily as needed.  [place] controlled substance database reviewed today. Continue Wellbutrin 150 mg XL daily. Follow-up every 5 and half months if requiring refills.  Recurrent cold sores We discussed his lab results reviewed in the EMR from 04/18/2020.  He had a  positive HSV 1 with a history of cold sores.  His HSV-2 was negative. With only having 3 cold sores a year, would recommend Valtrex use at the onset of symptoms.  This was explained to him today and he is agreeable and rather treat cold sores at the time of symptoms. Valtrex 2 g at onset of symptoms, repeat dose once in 12 hours.  Return in about 7 weeks (around 07/18/2022) for cpe (20 min).  No orders of the defined types were placed in this encounter.  Meds ordered this encounter  Medications   buPROPion (WELLBUTRIN XL) 150 MG 24 hr tablet    Sig: Take 1 tablet (150 mg total) by mouth in the morning.  Dispense:  90 tablet    Refill:  1   valACYclovir (VALTREX) 1000 MG tablet    Sig: 2 tabs PO at onset of symptoms, repeat dose once in 12 hours    Dispense:  20 tablet    Refill:  0   LORazepam (ATIVAN) 1 MG tablet    Sig: Take 0.5-1 tablets (0.5-1 mg total) by mouth daily as needed for anxiety.    Dispense:  90 tablet    Refill:  1   Referral Orders  No referral(s) requested today     Note is dictated utilizing voice recognition software. Although note has been proof read prior to signing, occasional typographical errors still can be missed. If any questions arise, please do not hesitate to call for verification.  Electronically signed by: Felix Pacini, DO [facility] [place]

## 2022-05-30 NOTE — Patient Instructions (Addendum)
Return in about 7 weeks (around 07/18/2022) for cpe (20 min).        Great to see you today.  I have refilled the medication(s) we provide.   If labs were collected, we will inform you of lab results once received either by echart message or telephone call.   - echart message- for normal results that have been seen by the patient already.   - telephone call: abnormal results or if patient has not viewed results in their echart.

## 2022-06-12 ENCOUNTER — Encounter: Payer: 59 | Admitting: Internal Medicine

## 2022-07-24 ENCOUNTER — Other Ambulatory Visit: Payer: Self-pay | Admitting: Internal Medicine

## 2022-08-01 ENCOUNTER — Encounter: Payer: PRIVATE HEALTH INSURANCE | Admitting: Family Medicine

## 2022-09-07 ENCOUNTER — Other Ambulatory Visit: Payer: Self-pay | Admitting: Internal Medicine

## 2022-09-11 ENCOUNTER — Ambulatory Visit: Payer: PRIVATE HEALTH INSURANCE | Admitting: Urology

## 2022-09-11 ENCOUNTER — Encounter: Payer: Self-pay | Admitting: Urology

## 2022-09-11 VITALS — BP 127/79 | HR 92 | Ht 67.0 in | Wt 210.0 lb

## 2022-09-11 DIAGNOSIS — Z9852 Vasectomy status: Secondary | ICD-10-CM

## 2022-09-11 DIAGNOSIS — N529 Male erectile dysfunction, unspecified: Secondary | ICD-10-CM | POA: Diagnosis not present

## 2022-09-11 MED ORDER — SILDENAFIL CITRATE 100 MG PO TABS: 100.0000 mg | ORAL_TABLET | Freq: Every day | ORAL | 11 refills | Status: DC | PRN

## 2022-09-11 NOTE — Progress Notes (Signed)
Assessment: 1. Organic impotence   2. Status post vasectomy     Plan: I personally reviewed the patient's chart including provider notes. Rx for sildenafil 100 mg prn provided. Patient aware that he is unable to use nitroglycerin in conjunction with sildenafil. Post vasectomy semen analysis ordered Return to office in 1 year or prn  Chief Complaint:  Chief Complaint  Patient presents with   Erectile Dysfunction    History of Present Illness:  Marcus Roberts is a 47 y.o. male who is seen for evaluation of erectile dysfunction and post vasectomy semen analysis.  He is status post a vasectomy on 09/29/2021 by Dr. Lonna Cobb in [place].  He apparently has not had any post vasectomy testing to confirm his sterility.  No problems since the procedure.  He is a history of erectile dysfunction and has been using oral medications.  He has used tadalafil and sildenafil previously.  He feels like the sildenafil works better for him.  He has previously used 50-100 mg as needed.  He is requesting a refill.  He is not having any significant lower urinary tract symptoms.  He does report an occasional weak stream and sensation of incomplete emptying.  No dysuria or gross hematuria. IPSS = 7 today.  Past Medical History:  Past Medical History:  Diagnosis Date   Anxiety    Aortic atherosclerosis (HCC)    CAD (coronary artery disease)    a. 12/2018 ETT: Ex time 9:39. 2mm horiz ST dep in V4-V6 with abnormal coronary CTA. b. 02/2019 PCI: LM nl, LAD 60p/m, 91m (3.0x38 Synergy XD DES), 40d, D2 90p, LCX 155m, RCA 57m, 60d (FFR 0.8), RPDA 40, RPAV 100 CTO. EF 55%; c. 02/2021 PCI: LM nl, LAD patent stent, 40d, D2 90, LCX 30ost/p, 136m CTO, OM1/2 20, RCA 10m (3.0x18 Onyx Frontier DES), 60d, RPAV 100 CTO (L->R collats). EF 55-65%.   Cervicalgia 12/24/2019   Chest pain    Depression    Elevated troponin 12/11/2018   GERD (gastroesophageal reflux disease)    History of echocardiogram    a. 12/2018 Echo: EF  60-65%, no rwma. Mildly dil LA.   Hyperlipidemia    Kidney stones    Multiple nevi 06/12/2017    Past Surgical History:  Past Surgical History:  Procedure Laterality Date   CHOLECYSTECTOMY  2013   CORONARY ATHERECTOMY N/A 02/13/2019   Procedure: CORONARY ATHERECTOMY;  Surgeon: Yvonne Kendall, MD;  [facility];  Service: Cardiovascular;  Laterality: N/A;   CORONARY PRESSURE/FFR STUDY N/A 02/13/2019   Procedure: INTRAVASCULAR PRESSURE WIRE/FFR STUDY;  Surgeon: Yvonne Kendall, MD;  [facility];  Service: Cardiovascular;  Laterality: N/A;   CORONARY PRESSURE/FFR STUDY N/A 07/11/2021   Procedure: INTRAVASCULAR PRESSURE WIRE/FFR STUDY;  Surgeon: Yvonne Kendall, MD;  Location: [facility];  Service: Cardiovascular;  Laterality: N/A;   CORONARY STENT INTERVENTION N/A 02/13/2019   Procedure: CORONARY STENT INTERVENTION;  Surgeon: Yvonne Kendall, MD;  [facility];  Service: Cardiovascular;  Laterality: N/A;   CORONARY STENT INTERVENTION N/A 03/03/2021   Procedure: CORONARY STENT INTERVENTION;  Surgeon: Yvonne Kendall, MD;  Location: [facility];  Service: Cardiovascular;  Laterality: N/A;   CORONARY ULTRASOUND/IVUS N/A 02/13/2019   Procedure: Intravascular Ultrasound/IVUS;  Surgeon: Yvonne Kendall, MD;  [facility];  Service: Cardiovascular;  Laterality: N/A;   LEFT HEART CATH AND CORONARY ANGIOGRAPHY N/A 02/13/2019   Procedure: LEFT HEART CATH AND CORONARY ANGIOGRAPHY;  Surgeon: Yvonne Kendall,  MD;  [facility];  Service: Cardiovascular;  Laterality: N/A;   LEFT HEART CATH AND CORONARY ANGIOGRAPHY Left 03/03/2021   Procedure: LEFT HEART CATH AND CORONARY ANGIOGRAPHY;  Surgeon: Yvonne Kendall, MD;  Location: [facility];  Service: Cardiovascular;  Laterality: Left;   LEFT HEART CATH AND CORONARY ANGIOGRAPHY Left 07/11/2021   Procedure: LEFT HEART CATH AND CORONARY  ANGIOGRAPHY;  Surgeon: Yvonne Kendall, MD;  Location: [facility];  Service: Cardiovascular;  Laterality: Left;   VASECTOMY     vertical sleeve gastrectomy     2013 85% stomach removed was 280 lbs before surgery     Allergies:  Allergies  Allergen Reactions   Imdur [Isosorbide Nitrate]     Did not tolerate due to headache   Zetia [Ezetimibe] Other (See Comments)    Head pressure/headaches Chest discomfort    Family History:  Family History  Problem Relation Age of Onset   Diabetes Father    Heart disease Father        x2   Hyperlipidemia Father    Hypertension Father    Heart attack Father 73       x2; CABG x 3 11/2019   Heart disease Brother        MI in 75s   Heart attack Brother 39    Social History:  Social History   Tobacco Use   Smoking status: Never    Passive exposure: Never   Smokeless tobacco: Never  Vaping Use   Vaping status: Never Used  Substance Use Topics   Alcohol use: Yes    Comment: occas. 2-3 x per month   Drug use: Never    Review of symptoms:  Constitutional:  Negative for unexplained weight loss, night sweats, fever, chills ENT:  Negative for nose bleeds, sinus pain, painful swallowing CV:  Negative for chest pain, shortness of breath, exercise intolerance, palpitations, loss of consciousness Resp:  Negative for cough, wheezing, shortness of breath GI:  Negative for nausea, vomiting, diarrhea, bloody stools GU:  Positives noted in HPI; otherwise negative for gross hematuria, dysuria, urinary incontinence Neuro:  Negative for seizures, poor balance, limb weakness, slurred speech Psych:  Negative for lack of energy, depression, anxiety Endocrine:  Negative for polydipsia, polyuria, symptoms of hypoglycemia (dizziness, hunger, sweating) Hematologic:  Negative for anemia, purpura, petechia, prolonged or excessive bleeding, use of anticoagulants  Allergic:  Negative for difficulty breathing or choking as a result of exposure to  anything; no shellfish allergy; no allergic response (rash/itch) to materials, foods  Physical exam: BP 127/79   Pulse 92   Ht 5\' 7"  (1.702 m)   Wt 210 lb (95.3 kg)   BMI 32.89 kg/m  GENERAL APPEARANCE:  Well appearing, well developed, well nourished, NAD HEENT: Atraumatic, Normocephalic, oropharynx clear. NECK: Supple without lymphadenopathy or thyromegaly. LUNGS: Clear to auscultation bilaterally. HEART: Regular Rate and Rhythm without murmurs, gallops, or rubs. ABDOMEN: Soft, non-tender, No Masses. EXTREMITIES: Moves all extremities well.  Without clubbing, cyanosis, or edema. NEUROLOGIC:  Alert and oriented x 3, normal gait, CN II-XII grossly intact.  MENTAL STATUS:  Appropriate. BACK:  Non-tender to palpation.  No CVAT SKIN:  Warm, dry and intact.    Results: None

## 2022-09-13 ENCOUNTER — Other Ambulatory Visit: Payer: Self-pay

## 2022-09-13 ENCOUNTER — Other Ambulatory Visit: Payer: PRIVATE HEALTH INSURANCE

## 2022-09-13 DIAGNOSIS — Z9852 Vasectomy status: Secondary | ICD-10-CM

## 2022-09-17 ENCOUNTER — Other Ambulatory Visit: Payer: Self-pay

## 2022-09-17 ENCOUNTER — Encounter: Payer: Self-pay | Admitting: Internal Medicine

## 2022-09-17 ENCOUNTER — Encounter: Payer: Self-pay | Admitting: Family Medicine

## 2022-09-17 DIAGNOSIS — F419 Anxiety disorder, unspecified: Secondary | ICD-10-CM

## 2022-09-17 MED ORDER — NITROGLYCERIN 0.4 MG SL SUBL: 0.4000 mg | SUBLINGUAL_TABLET | SUBLINGUAL | 1 refills | Status: AC | PRN

## 2022-09-17 MED ORDER — ROSUVASTATIN CALCIUM 40 MG PO TABS: 40.0000 mg | ORAL_TABLET | Freq: Every day | ORAL | 0 refills | Status: DC

## 2022-09-17 MED ORDER — AMLODIPINE BESYLATE 5 MG PO TABS: 5.0000 mg | ORAL_TABLET | Freq: Every day | ORAL | 0 refills | Status: AC

## 2022-09-17 MED ORDER — FUROSEMIDE 20 MG PO TABS: 20.0000 mg | ORAL_TABLET | Freq: Every day | ORAL | 0 refills | Status: AC | PRN

## 2022-09-17 MED ORDER — CLOPIDOGREL BISULFATE 75 MG PO TABS: 75.0000 mg | ORAL_TABLET | Freq: Every day | ORAL | 0 refills | Status: DC

## 2022-09-17 NOTE — Telephone Encounter (Signed)
Last visit with Dr. Okey Dupre on 03/15/22 with plan to f/u in 6 moths.  Please contact pt to schedule f/u.  Thanks

## 2022-09-17 NOTE — Telephone Encounter (Signed)
Requested Prescriptions   Signed Prescriptions Disp Refills   amLODipine (NORVASC) 5 MG tablet 90 tablet 0    Sig: Take 1 tablet (5 mg total) by mouth daily.    Authorizing Provider: END, CHRISTOPHER    Ordering User: Feliberto Harts L   clopidogrel (PLAVIX) 75 MG tablet 90 tablet 0    Sig: Take 1 tablet (75 mg total) by mouth daily.    Authorizing Provider: END, CHRISTOPHER    Ordering User: Katrinka Blazing, Marquies Wanat L   furosemide (LASIX) 20 MG tablet 90 tablet 0    Sig: Take 1 tablet (20 mg total) by mouth daily as needed.    Authorizing Provider: END, CHRISTOPHER    Ordering User: Katrinka Blazing, Sahib Pella L   nitroGLYCERIN (NITROSTAT) 0.4 MG SL tablet 25 tablet 1    Sig: Place 1 tablet (0.4 mg total) under the tongue every 5 (five) minutes as needed for chest pain.    Authorizing Provider: END, CHRISTOPHER    Ordering User: Katrinka Blazing, Jony Ladnier L   rosuvastatin (CRESTOR) 40 MG tablet 90 tablet 0    Sig: Take 1 tablet (40 mg total) by mouth daily.    Authorizing Provider: END, CHRISTOPHER    Ordering User: Guerry Minors   Refill sent to new pharmacy at pt request.

## 2022-09-18 NOTE — Telephone Encounter (Signed)
Left voicemail to schedule follow up appt

## 2022-09-19 MED ORDER — BUPROPION HCL ER (XL) 150 MG PO TB24: 150.0000 mg | ORAL_TABLET | Freq: Every morning | ORAL | 0 refills | Status: DC

## 2022-10-18 ENCOUNTER — Encounter: Payer: Self-pay | Admitting: Internal Medicine

## 2022-10-19 ENCOUNTER — Other Ambulatory Visit: Payer: Self-pay

## 2022-10-19 MED ORDER — RANOLAZINE ER 1000 MG PO TB12: 1000.0000 mg | ORAL_TABLET | Freq: Two times a day (BID) | ORAL | 3 refills | Status: AC

## 2022-10-19 NOTE — Telephone Encounter (Signed)
It is fine to resume ranolazine 1000 mg twice daily.  However, we should have Marcus Roberts follow-up with me or an APP in the next couple of weeks to reassess his symptoms and make sure that we do not need to evaluate his recurrent chest pain further.  Yvonne Kendall, MD [facility]

## 2022-10-26 ENCOUNTER — Ambulatory Visit: Payer: PRIVATE HEALTH INSURANCE | Admitting: Family Medicine

## 2022-10-26 ENCOUNTER — Encounter: Payer: Self-pay | Admitting: Family Medicine

## 2022-10-26 VITALS — BP 130/89 | HR 67 | Temp 97.9°F | Wt 220.4 lb

## 2022-10-26 DIAGNOSIS — Z1211 Encounter for screening for malignant neoplasm of colon: Secondary | ICD-10-CM

## 2022-10-26 DIAGNOSIS — F419 Anxiety disorder, unspecified: Secondary | ICD-10-CM | POA: Diagnosis not present

## 2022-10-26 DIAGNOSIS — B001 Herpesviral vesicular dermatitis: Secondary | ICD-10-CM | POA: Diagnosis not present

## 2022-10-26 DIAGNOSIS — F32A Depression, unspecified: Secondary | ICD-10-CM

## 2022-10-26 MED ORDER — BUPROPION HCL ER (XL) 150 MG PO TB24: 150.0000 mg | ORAL_TABLET | Freq: Every morning | ORAL | 1 refills | Status: DC

## 2022-10-26 MED ORDER — LORAZEPAM 1 MG PO TABS: 0.5000 mg | ORAL_TABLET | Freq: Every day | ORAL | 5 refills | Status: AC | PRN

## 2022-10-26 NOTE — Progress Notes (Signed)
Patient ID: Marcus Roberts, male  DOB: 08/01/1975, 47 y.o.   MRN: [id number] Patient Care Team    Relationship Specialty Notifications Start End  Natalia Leatherwood, DO PCP - General Family Medicine  05/30/22   End, Cristal Deer, MD Consulting Physician Cardiology  05/30/18   Riki Altes, MD  Urology  05/30/22     Chief Complaint  Patient presents with   Anxiety    CMC; decline flu had COVID a few weeks ago and still has cough; Needs to schedule CPE for CCS    Subjective: Marcus Roberts is a 47 y.o.  male present for chronic condition management All past medical history, surgical history, allergies, family history, immunizations, medications and social history were updated in the electronic medical record today. All recent labs, ED visits and hospitalizations within the last year were reviewed.  Anxiety/depression: Patient reports compliance with Wellbutrin 150 mg daily and Ativan 0.5-1 mg daily as needed.  Prior note: Patient reports he has been prescribed Ativan 0.5-1 mg as needed daily.  He reports there are many days he does not take the medication at all, but there are some days in which he may need to take a full 1 mg for his anxiety.  He reports he was started on Wellbutrin 150 mg XL 6 months ago.  He has been receiving this medication through an online provider.  He would like to get everything switched over to PCP today.  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  Patient has a history of recurrent cold sores.  He reports he has been prescribed acyclovir half a tab daily.  There was some confusion if he was positive for HSV 2, however EMR review today showed that this was negative.  Patient reports he would get about 3 cold sores a year typically.     10/26/2022    8:23 AM 05/30/2022    2:10 PM 06/07/2021    2:59 PM 10/25/2020   12:04 PM 03/12/2019    9:10 AM  Depression screen PHQ 2/9  Decreased Interest 1 0 0 3 2  Down, Depressed, Hopeless 1 0 1 3 2   PHQ - 2 Score 2 0 1 6 4    Altered sleeping 1   3 2   Tired, decreased energy 1   2 2   Change in appetite 1   3 2   Feeling bad or failure about yourself  1   3 3   Trouble concentrating 2   3 2   Moving slowly or fidgety/restless 0   2 1  Suicidal thoughts 0   0 0  PHQ-9 Score 8   22 16   Difficult doing work/chores Not difficult at all   Very difficult Somewhat difficult      10/26/2022    8:23 AM 10/25/2020   12:05 PM 12/04/2017    2:56 PM  GAD 7 : Generalized Anxiety Score  Nervous, Anxious, on Edge 3 3 3   Control/stop worrying 3 3 2   Worry too much - different things 3 3 2   Trouble relaxing 2 1 3   Restless 0 1 1  Easily annoyed or irritable 2 1 1   Afraid - awful might happen 1 3 1   Total GAD 7 Score 14 15 13   Anxiety Difficulty Somewhat difficult Very difficult Not difficult at all          10/26/2022    8:23 AM 05/30/2022    3:41 PM 06/07/2021    2:59 PM 10/25/2020   12:04 PM 12/24/2019  2:35 PM  Fall Risk   Falls in the past year? 0 0 0 0 0  Number falls in past yr: 0 0 0 0 0  Injury with Fall? 0 0 0 0 0  Risk for fall due to : No Fall Risks  No Fall Risks    Follow up Falls evaluation completed Falls evaluation completed Falls evaluation completed Falls evaluation completed Falls evaluation completed     Immunization History  Administered Date(s) Administered   Influenza,inj,Quad PF,6+ Mos 11/21/2016, 10/21/2017, 11/25/2019, 10/25/2020, 11/17/2021   PFIZER(Purple Top)SARS-COV-2 Vaccination 09/03/2019, 09/24/2019   Tdap 06/12/2017    No results found.  Past Medical History:  Diagnosis Date   Anxiety    Aortic atherosclerosis (HCC)    CAD (coronary artery disease)    a. 12/2018 ETT: Ex time 9:39. 2mm horiz ST dep in V4-V6 with abnormal coronary CTA. b. 02/2019 PCI: LM nl, LAD 60p/m, 46m (3.0x38 Synergy XD DES), 40d, D2 90p, LCX 172m, RCA 30m, 60d (FFR 0.8), RPDA 40, RPAV 100 CTO. EF 55%; c. 02/2021 PCI: LM nl, LAD patent stent, 40d, D2 90, LCX 30ost/p, 122m CTO, OM1/2 20, RCA 30m (3.0x18  Onyx Frontier DES), 60d, RPAV 100 CTO (L->R collats). EF 55-65%.   Cervicalgia 12/24/2019   Chest pain    Depression    Elevated troponin 12/11/2018   Gastroesophageal reflux disease 06/12/2017   GERD (gastroesophageal reflux disease)    History of echocardiogram    a. 12/2018 Echo: EF 60-65%, no rwma. Mildly dil LA.   Hyperlipidemia    Kidney stones    Multiple nevi 06/12/2017   Allergies  Allergen Reactions   Imdur [Isosorbide Nitrate]     Did not tolerate due to headache   Zetia [Ezetimibe] Other (See Comments)    Head pressure/headaches Chest discomfort   Past Surgical History:  Procedure Laterality Date   CHOLECYSTECTOMY  2013   CORONARY ATHERECTOMY N/A 02/13/2019   Procedure: CORONARY ATHERECTOMY;  Surgeon: Yvonne Kendall, MD;  [facility];  Service: Cardiovascular;  Laterality: N/A;   CORONARY PRESSURE/FFR STUDY N/A 02/13/2019   Procedure: INTRAVASCULAR PRESSURE WIRE/FFR STUDY;  Surgeon: Yvonne Kendall, MD;  Location: [facility];  Service: Cardiovascular;  Laterality: N/A;   CORONARY PRESSURE/FFR STUDY N/A 07/11/2021   Procedure: INTRAVASCULAR PRESSURE WIRE/FFR STUDY;  Surgeon: Yvonne Kendall, MD;  Location: [facility];  Service: Cardiovascular;  Laterality: N/A;   CORONARY STENT INTERVENTION N/A 02/13/2019   Procedure: CORONARY STENT INTERVENTION;  Surgeon: Yvonne Kendall, MD;  Location: [facility];  Service: Cardiovascular;  Laterality: N/A;   CORONARY STENT INTERVENTION N/A 03/03/2021   Procedure: CORONARY STENT INTERVENTION;  Surgeon: Yvonne Kendall, MD;  Location: [facility];  Service: Cardiovascular;  Laterality: N/A;   CORONARY ULTRASOUND/IVUS N/A 02/13/2019   Procedure: Intravascular Ultrasound/IVUS;  Surgeon: Yvonne Kendall, MD;  Location: [facility];  Service: Cardiovascular;  Laterality: N/A;   LEFT HEART CATH AND CORONARY ANGIOGRAPHY N/A 02/13/2019   Procedure: LEFT HEART CATH AND CORONARY  ANGIOGRAPHY;  Surgeon: Yvonne Kendall, MD;  Location: [facility];  Service: Cardiovascular;  Laterality: N/A;   LEFT HEART CATH AND CORONARY ANGIOGRAPHY Left 03/03/2021   Procedure: LEFT HEART CATH AND CORONARY ANGIOGRAPHY;  Surgeon: Yvonne Kendall, MD;  Location: [facility];  Service: Cardiovascular;  Laterality: Left;   LEFT HEART CATH AND CORONARY ANGIOGRAPHY Left 07/11/2021   Procedure: LEFT HEART CATH AND CORONARY ANGIOGRAPHY;  Surgeon: Yvonne Kendall, MD;  Location:  [facility];  Service: Cardiovascular;  Laterality: Left;   VASECTOMY     vertical sleeve gastrectomy     2013 85% stomach removed was 280 lbs before surgery    Family History  Problem Relation Age of Onset   Diabetes Father    Heart disease Father        x2   Hyperlipidemia Father    Hypertension Father    Heart attack Father 27       x2; CABG x 3 11/2019   Heart disease Brother        MI in 26s   Heart attack Brother 51   Social History   Social History Narrative   Marital status/children/pets: divorced. From [place].    Education/employment: works as a Firefighter:      -smoke alarm in the home:Yes     - wears seatbelt: Yes     - Feels safe in their relationships: Yes          Allergies as of 10/26/2022       Reactions   Imdur [isosorbide Nitrate]    Did not tolerate due to headache   Zetia [ezetimibe] Other (See Comments)   Head pressure/headaches Chest discomfort        Medication List        Accurate as of October 26, 2022  1:15 PM. If you have any questions, ask your nurse or doctor.          amLODipine 5 MG tablet Commonly known as: NORVASC Take 1 tablet (5 mg total) by mouth daily.   aspirin EC 81 MG tablet Take 81 mg by mouth daily.   Biotin 65784 MCG Tabs Take 10,000 mcg by mouth in the morning.   buPROPion 150 MG 24 hr tablet Commonly known as: WELLBUTRIN XL Take 1 tablet (150 mg total) by mouth in the morning.    clopidogrel 75 MG tablet Commonly known as: PLAVIX Take 1 tablet (75 mg total) by mouth daily.   furosemide 20 MG tablet Commonly known as: LASIX Take 1 tablet (20 mg total) by mouth daily as needed.   LORazepam 1 MG tablet Commonly known as: ATIVAN Take 0.5-1 tablets (0.5-1 mg total) by mouth daily as needed for anxiety. What changed: Another medication with the same name was removed. Continue taking this medication, and follow the directions you see here. Changed by: Felix Pacini   nitroGLYCERIN 0.4 MG SL tablet Commonly known as: NITROSTAT Place 1 tablet (0.4 mg total) under the tongue every 5 (five) minutes as needed for chest pain.   ranolazine 1000 MG SR tablet Commonly known as: RANEXA Take 1 tablet (1,000 mg total) by mouth 2 (two) times daily.   rosuvastatin 40 MG tablet Commonly known as: CRESTOR Take 1 tablet (40 mg total) by mouth daily.   sildenafil 100 MG tablet Commonly known as: VIAGRA Take 1 tablet (100 mg total) by mouth daily as needed for erectile dysfunction.   tadalafil 20 MG tablet Commonly known as: CIALIS 1 tab 1 hour prior to intercourse.  Do not take nitroglycerin within 36 hours of taking tadalafil   valACYclovir 1000 MG tablet Commonly known as: VALTREX 2 tabs PO at onset of symptoms, repeat dose once in 12 hours   Vitamin D3 125 MCG (5000 UT) Tabs Take 5,000 Units by mouth in the morning.        All past medical history, surgical history, allergies, family history, immunizations andmedications were updated in the  EMR today and reviewed under the history and medication portions of their EMR.     ROS 14 pt review of systems performed and negative (unless mentioned in an HPI)  Objective: BP 130/89   Pulse 67   Temp 97.9 F (36.6 C)   Wt 220 lb 6.4 oz (100 kg)   SpO2 99%   BMI 34.52 kg/m  Physical Exam Vitals and nursing note reviewed.  Constitutional:      General: He is not in acute distress.    Appearance: Normal  appearance. He is not ill-appearing, toxic-appearing or diaphoretic.  HENT:     Head: Normocephalic and atraumatic.  Eyes:     General: No scleral icterus.       Right eye: No discharge.        Left eye: No discharge.     Extraocular Movements: Extraocular movements intact.     Pupils: Pupils are equal, round, and reactive to light.  Skin:    General: Skin is warm and dry.     Coloration: Skin is not jaundiced or pale.     Findings: No rash.  Neurological:     Mental Status: He is alert and oriented to person, place, and time. Mental status is at baseline.  Psychiatric:        Mood and Affect: Mood normal.        Behavior: Behavior normal.        Thought Content: Thought content normal.        Judgment: Judgment normal.     Assessment/plan: Marcus Roberts is a 47 y.o. male present for chronic condition management Anxiety and depression Stable Continue Ativan 0.5-1 mg daily as needed.  [place] controlled substance database reviewed today Continue Wellbutrin 150 mg XL daily.  Recurrent cold sores We discussed his lab results reviewed in the EMR from 04/18/2020.  He had a positive HSV 1 with a history of cold sores.  His HSV-2 was negative. Continue Valtrex 2 g at onset of symptoms, repeat dose once in 12 hours.  Colon cancer screen: Discussed cologuard> ordered.    Return in about 11 weeks (around 01/11/2023) for cpe (20 min), Routine chronic condition follow-up.  Orders Placed This Encounter  Procedures   Cologuard   Meds ordered this encounter  Medications   buPROPion (WELLBUTRIN XL) 150 MG 24 hr tablet    Sig: Take 1 tablet (150 mg total) by mouth in the morning.    Dispense:  90 tablet    Refill:  1   LORazepam (ATIVAN) 1 MG tablet    Sig: Take 0.5-1 tablets (0.5-1 mg total) by mouth daily as needed for anxiety.    Dispense:  30 tablet    Refill:  5   Referral Orders  No referral(s) requested today     Note is dictated utilizing voice recognition  software. Although note has been proof read prior to signing, occasional typographical errors still can be missed. If any questions arise, please do not hesitate to call for verification.  Electronically signed by: Felix Pacini, DO [facility] [place]

## 2022-10-26 NOTE — Patient Instructions (Addendum)
Return in about 11 weeks (around 01/11/2023) for cpe (20 min), Routine chronic condition follow-up.   Senokot-S and miralax will help with constipation.      Great to see you today.  I have refilled the medication(s) we provide.   If labs were collected or images ordered, we will inform you of  results once we have received them and reviewed. We will contact you either by echart message, or telephone call.  Please give ample time to the testing facility, and our office to run,  receive and review results. Please do not call inquiring of results, even if you can see them in your chart. We will contact you as soon as we are able. If it has been over 1 week since the test was completed, and you have not yet heard from [place], then please call us.    - echart message- for normal results that have been seen by the patient already.   - telephone call: abnormal results or if patient has not viewed results in their echart.  If a referral to a specialist was entered for you, please call us in 2 weeks if you have not heard from the specialist office to schedule.

## 2022-10-29 ENCOUNTER — Encounter: Payer: Self-pay | Admitting: Urology

## 2022-10-29 ENCOUNTER — Other Ambulatory Visit: Payer: Self-pay | Admitting: Urology

## 2022-10-29 DIAGNOSIS — N529 Male erectile dysfunction, unspecified: Secondary | ICD-10-CM

## 2022-10-29 MED ORDER — SILDENAFIL CITRATE 100 MG PO TABS: 100.0000 mg | ORAL_TABLET | Freq: Every day | ORAL | 11 refills | Status: DC | PRN

## 2022-11-04 ENCOUNTER — Encounter: Payer: Self-pay | Admitting: Family Medicine

## 2022-11-05 NOTE — Telephone Encounter (Signed)
Ok to place referral.

## 2022-11-09 ENCOUNTER — Encounter: Payer: Self-pay | Admitting: Internal Medicine

## 2022-11-09 ENCOUNTER — Ambulatory Visit: Payer: PRIVATE HEALTH INSURANCE | Attending: Internal Medicine | Admitting: Internal Medicine

## 2022-11-09 VITALS — BP 110/78 | HR 72 | Ht 67.0 in | Wt 223.1 lb

## 2022-11-09 DIAGNOSIS — E785 Hyperlipidemia, unspecified: Secondary | ICD-10-CM

## 2022-11-09 DIAGNOSIS — I25110 Atherosclerotic heart disease of native coronary artery with unstable angina pectoris: Secondary | ICD-10-CM | POA: Diagnosis not present

## 2022-11-09 DIAGNOSIS — Z79899 Other long term (current) drug therapy: Secondary | ICD-10-CM | POA: Diagnosis not present

## 2022-11-09 NOTE — Progress Notes (Unsigned)
Cardiology Office Note:  .   Date:  11/10/2022  ID:  Marcus Roberts, DOB [birth year], MRN [id number] PCP: Natalia Leatherwood, DO  [facility] Providers Cardiologist:  Yvonne Kendall, MD     History of Present Illness: .   Discussed the use of AI scribe software for clinical note transcription with the patient, who gave verbal consent to proceed.  Marcus Roberts is a 47 y.o. male with history of coronary artery disease, hyperlipidemia, GERD, obesity status post gastric sleeve, depression, and nephrolithiasis, who presents for follow-up of coronary artery disease.  I last saw him in February, which time he was feeling fairly well.  He had run out of ranolazine due to financial constraints and reported that he did not feel any worse off the medication.  We therefore agreed to defer resumption of ranolazine, continuing amlodipine alone for antianginal therapy.  He subsequently reached out to our office last month requesting a refill of ranolazine, which he had restarted due to sporadic chest pain.  Today, Marcus Roberts raises concerns about lingering chest symptoms following a suspected COVID-19 infection approximately 4-6 weeks ago. He did not get tested for COVID-19 during this recent illness but reported symptoms similar to a previous confirmed COVID-19 infection. Marcus Roberts described the chest symptoms as discomfort and shortness of breath, which were inconsistent and varied in intensity. The symptoms were not consistently provoked by physical activity, as he reported being able to climb three flights of stairs without chest pain. However, he did note episodes of chest discomfort during sexual activity. He also reports a persistent cough, which was more pronounced in the morning and at night. The cough has been mostly dry, but there are periods when he is able to expectorate some mucus. The patient did not report any noticeable swelling in the legs or any abnormal heart rhythms.     ROS: See  HPI  Studies Reviewed: Marland Kitchen   EKG Interpretation Date/Time:  Friday November 09 2022 09:27:15 EDT Ventricular Rate:  72 PR Interval:  168 QRS Duration:  84 QT Interval:  398 QTC Calculation: 435 R Axis:   30  Text Interpretation: Normal sinus rhythm Normal ECG When compared with ECG of 15-Mar-2022 No significant change was found Confirmed by Guerino Caporale, Cristal Deer [phone]) on 11/10/2022 7:54:24 PM    LHC (07/11/2021): Stable appearance of multivessel coronary artery disease since completion of cath/PCI in 02/2021.  60% distal RCA stenosis is not hemodynamically significant (iFR = 1.0). Normal left ventricular contraction (LVEF > 65%) with mildly elevated filling pressure (LVEDP 20 mmHg).  Risk Assessment/Calculations:             Physical Exam:   VS:  BP 110/78 (BP Location: Left Arm, Patient Position: Sitting, Cuff Size: Normal)   Pulse 72   Ht 5\' 7"  (1.702 m)   Wt 223 lb 2 oz (101.2 kg)   SpO2 99%   BMI 34.95 kg/m    Wt Readings from Last 3 Encounters:  11/09/22 223 lb 2 oz (101.2 kg)  10/26/22 220 lb 6.4 oz (100 kg)  09/11/22 210 lb (95.3 kg)    General:  NAD. Neck: No JVD or HJR. Lungs: Clear to auscultation bilaterally without wheezes or crackles. Heart: Regular rate and rhythm without murmurs, rubs, or gallops. Abdomen: Soft, nontender, nondistended. Extremities: No lower extremity edema.  ASSESSMENT AND PLAN: .    Coronary artery disease with angina pectoris: Marcus Roberts reports that he had been doing well until he contracted a respiratory illness  about 6 weeks ago that was reminiscent of COVID-19.  Since then, he has been having some intermittent chest pain.  The pain has both atypical and typical symptoms.  However, given the fact that it coincided with onset of respiratory symptoms and is improving with resolution of his respiratory symptoms, I suspect it is most likely noncardiac in nature.  However, he has a history of multivessel CAD and has undergone 2 prior coronary  interventions.  We discussed close monitoring with continuation of antianginal therapy with amlodipine and ranolazine, noninvasive ischemia testing with MPI, and repeat catheterization.  Marcus Roberts wishes to monitor his symptoms and defer additional testing for now.  If symptoms have not resolved at our next follow-up visit or worsen in the meantime, we will have a low threshold for moving forward with catheterization.  We will check a CBC, CMP, and lipid panel today.  Hyperlipidemia: Lipids have been well-controlled, though Lp(a) checked at a prior visit was noted to be quite elevated, conferring additional cardiovascular risk.  I will recheck a lipid panel and CMP today and consider reducing atorvastatin and adding a PCSK9 to better in the setting of elevated LP(a) to try to reduce Marcus Roberts's cardiovascular risk.    Dispo: Return to clinic in 3 months.  Signed, Yvonne Kendall, MD

## 2022-11-09 NOTE — Patient Instructions (Signed)
Medication Instructions:  Your physician recommends that you continue on your current medications as directed. Please refer to the Current Medication list given to you today.   *If you need a refill on your cardiac medications before your next appointment, please call your pharmacy*   Lab Work: Your provider would like for you to have following labs drawn today (CBC, CMP, Lipid).     Testing/Procedures: No test ordered today    Follow-Up: At [facility], you and your health needs are our priority.  As part of our continuing mission to provide you with exceptional heart care, we have created designated Provider Care Teams.  These Care Teams include your primary Cardiologist (physician) and Advanced Practice Providers (APPs -  Physician Assistants and Nurse Practitioners) who all work together to provide you with the care you need, when you need it.  We recommend signing up for the patient portal called "MyChart".  Sign up information is provided on this After Visit Summary.  MyChart is used to connect with patients for Virtual Visits (Telemedicine).  Patients are able to view lab/test results, encounter notes, upcoming appointments, etc.  Non-urgent messages can be sent to your provider as well.   To learn more about what you can do with MyChart, go to [email or web address].    Your next appointment:   3 month(s)  Provider:   You may see Yvonne Kendall, MD or one of the following Advanced Practice Providers on your designated Care Team:   Nicolasa Ducking, NP Eula Listen, PA-C Cadence Fransico Michael, PA-C Charlsie Quest, NP

## 2022-11-10 ENCOUNTER — Encounter: Payer: Self-pay | Admitting: Internal Medicine

## 2022-11-10 LAB — COMPREHENSIVE METABOLIC PANEL
ALT: 18 [IU]/L (ref 0–44)
AST: 19 [IU]/L (ref 0–40)
Albumin: 4.2 g/dL (ref 4.1–5.1)
Alkaline Phosphatase: 73 [IU]/L (ref 44–121)
BUN/Creatinine Ratio: 11 (ref 9–20)
BUN: 11 mg/dL (ref 6–24)
Bilirubin Total: 0.6 mg/dL (ref 0.0–1.2)
CO2: 26 mmol/L (ref 20–29)
Calcium: 9.7 mg/dL (ref 8.7–10.2)
Chloride: 104 mmol/L (ref 96–106)
Creatinine, Ser: 1.01 mg/dL (ref 0.76–1.27)
Globulin, Total: 2.5 g/dL (ref 1.5–4.5)
Glucose: 78 mg/dL (ref 70–99)
Potassium: 4.2 mmol/L (ref 3.5–5.2)
Sodium: 141 mmol/L (ref 134–144)
Total Protein: 6.7 g/dL (ref 6.0–8.5)
eGFR: 93 mL/min/{1.73_m2} (ref >59)

## 2022-11-10 LAB — LIPID PANEL
Chol/HDL Ratio: 3 {ratio} (ref 0.0–5.0)
Cholesterol, Total: 145 mg/dL (ref 100–199)
HDL: 49 mg/dL (ref >39)
LDL Chol Calc (NIH): 74 mg/dL (ref 0–99)
Triglycerides: 122 mg/dL (ref 0–149)
VLDL Cholesterol Cal: 22 mg/dL (ref 5–40)

## 2022-11-10 LAB — CBC
Hematocrit: 44 % (ref 37.5–51.0)
Hemoglobin: 14.9 g/dL (ref 13.0–17.7)
MCH: 32.1 pg (ref 26.6–33.0)
MCHC: 33.9 g/dL (ref 31.5–35.7)
MCV: 95 fL (ref 79–97)
Platelets: 223 10*3/uL (ref 150–450)
RBC: 4.64 x10E6/uL (ref 4.14–5.80)
RDW: 12.8 % (ref 11.6–15.4)
WBC: 5.6 10*3/uL (ref 3.4–10.8)

## 2022-11-13 ENCOUNTER — Encounter: Payer: Self-pay | Admitting: Internal Medicine

## 2022-11-13 DIAGNOSIS — Z79899 Other long term (current) drug therapy: Secondary | ICD-10-CM

## 2022-11-15 MED ORDER — REPATHA SURECLICK 140 MG/ML ~~LOC~~ SOAJ: 140.0000 mg | SUBCUTANEOUS | 3 refills | Status: AC

## 2022-11-15 MED ORDER — ROSUVASTATIN CALCIUM 20 MG PO TABS: 20.0000 mg | ORAL_TABLET | Freq: Every day | ORAL | 3 refills | Status: AC

## 2022-11-15 NOTE — Addendum Note (Signed)
Addended by: Parke Poisson on: 11/15/2022 11:09 AM   Modules accepted: Orders

## 2022-11-16 ENCOUNTER — Other Ambulatory Visit (HOSPITAL_COMMUNITY): Payer: Self-pay

## 2022-11-16 ENCOUNTER — Telehealth: Payer: Self-pay | Admitting: Pharmacist

## 2022-11-16 NOTE — Telephone Encounter (Signed)
Pharmacy Patient Advocate Encounter   Received notification from CoverMyMeds that prior authorization for Repatha SureClick 140MG /ML auto-injectors is required/requested.   Insurance verification completed.   The patient is insured through [facility] .   Per test claim: PA required; PA submitted to [facility] via CoverMyMeds Key/confirmation #/EOC [id number] Status is pending

## 2022-11-16 NOTE — Telephone Encounter (Signed)
Pharmacy Patient Advocate Encounter  Received notification from [facility] that Prior Authorization for Repatha SureClick 140MG /ML auto-injectors has been APPROVED from 11/16/2023 to 05/17/2023. Ran test claim, Copay is $45.00 for a month supply. This test claim was processed through [facility]- copay amounts may vary at other pharmacies due to pharmacy/plan contracts, or as the patient moves through the different stages of their insurance plan.   PA #/Case ID/Reference #: [id number]

## 2022-11-19 LAB — COLOGUARD: Cologuard: NEGATIVE

## 2022-11-24 LAB — COLOGUARD: COLOGUARD: NEGATIVE

## 2022-12-20 ENCOUNTER — Other Ambulatory Visit: Payer: Self-pay | Admitting: Internal Medicine

## 2023-01-17 ENCOUNTER — Encounter: Payer: PRIVATE HEALTH INSURANCE | Admitting: Family Medicine

## 2023-02-11 NOTE — Progress Notes (Deleted)
  Cardiology Office Note:  .   Date:  02/11/2023  ID:  Marcus Roberts, DOB 1975/06/02, MRN [id number] PCP: Catherine Charlies LABOR, DO  [facility] Providers Cardiologist:  Lonni Hanson, MD { Click to update primary MD,subspecialty MD or APP then REFRESH:1}    History of Present Illness: .   Marcus Roberts is a 48 y.o. male with history of coronary artery disease, hyperlipidemia, GERD, obesity status post gastric sleeve, depression, and nephrolithiasis, who presents for follow-up of CAD.  I last saw him in October at which time he complained of lingering chest symptoms after suspected COVID-19 infection 4 to 6 weeks earlier.  We discussed continued clinical monitoring, noninvasive ischemia evaluation, and catheterization.  Marcus Roberts wished to defer additional testing at that time.  We checked an LP(a) for risk stratification; this returned significantly elevated and prompted us  to add Repatha .  ROS: See HPI  Studies Reviewed: .        *** Risk Assessment/Calculations:   {Does this patient have ATRIAL FIBRILLATION?:[id number]} No BP recorded.  {Refresh Note OR Click here to enter BP  :1}***       Physical Exam:   VS:  There were no vitals taken for this visit.   Wt Readings from Last 3 Encounters:  11/09/22 223 lb 2 oz (101.2 kg)  10/26/22 220 lb 6.4 oz (100 kg)  09/11/22 210 lb (95.3 kg)    General:  NAD. Neck: No JVD or HJR. Lungs: Clear to auscultation bilaterally without wheezes or crackles. Heart: Regular rate and rhythm without murmurs, rubs, or gallops. Abdomen: Soft, nontender, nondistended. Extremities: No lower extremity edema.  ASSESSMENT AND PLAN: .    ***    {Are you ordering a CV Procedure (e.g. stress test, cath, DCCV, TEE, etc)?   Press F2        :[id number]}  Dispo: ***  Signed, Lonni Hanson, MD

## 2023-02-13 ENCOUNTER — Ambulatory Visit: Payer: PRIVATE HEALTH INSURANCE | Admitting: Internal Medicine

## 2023-03-13 ENCOUNTER — Other Ambulatory Visit: Payer: Self-pay | Admitting: Urology

## 2023-03-13 ENCOUNTER — Encounter: Payer: Self-pay | Admitting: Urology

## 2023-03-13 MED ORDER — TADALAFIL 20 MG PO TABS: ORAL_TABLET | ORAL | 5 refills | Status: AC

## 2023-03-28 NOTE — Progress Notes (Deleted)
  Cardiology Office Note:  .   Date:  03/28/2023  ID:  Marcus Roberts, DOB 1975/07/23, MRN [id number] PCP: Natalia Leatherwood, DO  [facility] Providers Cardiologist:  Yvonne Kendall, MD { Click to update primary MD,subspecialty MD or APP then REFRESH:1}    History of Present Illness: .   Marcus Roberts is a 48 y.o. male  with history of coronary artery disease status post PCI x 2, hyperlipidemia, GERD, obesity status post gastric sleeve, depression, and nephrolithiasis, who presents for follow-up of coronary artery disease.  I last saw him in 11/2022, at which time Marcus Roberts was concerned about lingering chest discomfort following suspected COVID-19 infection about a month earlier.  Chest discomfort seem to happen randomly and was not consistently present with exertion.  He agreed to defer medication changes and additional testing.  Due to LDL being above goal and significantly elevated LP(a), we agreed to add evolocumab and reduce rosuvastatin to 20 mg daily.  ROS: See HPI  Studies Reviewed: .        *** Risk Assessment/Calculations:   {Does this patient have ATRIAL FIBRILLATION?:[id number]} No BP recorded.  {Refresh Note OR Click here to enter BP  :1}***       Physical Exam:   VS:  There were no vitals taken for this visit.   Wt Readings from Last 3 Encounters:  11/09/22 223 lb 2 oz (101.2 kg)  10/26/22 220 lb 6.4 oz (100 kg)  09/11/22 210 lb (95.3 kg)    General:  NAD. Neck: No JVD or HJR. Lungs: Clear to auscultation bilaterally without wheezes or crackles. Heart: Regular rate and rhythm without murmurs, rubs, or gallops. Abdomen: Soft, nontender, nondistended. Extremities: No lower extremity edema.  ASSESSMENT AND PLAN: .    ***    {Are you ordering a CV Procedure (e.g. stress test, cath, DCCV, TEE, etc)?   Press F2        :[id number]}  Dispo: ***  Signed, Yvonne Kendall, MD

## 2023-03-29 ENCOUNTER — Ambulatory Visit: Payer: PRIVATE HEALTH INSURANCE | Attending: Internal Medicine | Admitting: Internal Medicine

## 2023-04-16 ENCOUNTER — Other Ambulatory Visit: Payer: Self-pay | Admitting: Internal Medicine

## 2023-04-16 NOTE — Telephone Encounter (Signed)
LVM to schedule appt, please schedule 

## 2023-04-16 NOTE — Telephone Encounter (Signed)
 Good Morning,  Could you schedule this patient an overdue 3 month follow up visit? The patient was last seen by Dr. Okey Dupre on 11-09-2022. Thank you so much.

## 2023-05-14 ENCOUNTER — Encounter: Payer: Self-pay | Admitting: Internal Medicine

## 2023-05-14 ENCOUNTER — Other Ambulatory Visit: Payer: Self-pay | Admitting: Emergency Medicine

## 2023-05-14 DIAGNOSIS — I25110 Atherosclerotic heart disease of native coronary artery with unstable angina pectoris: Secondary | ICD-10-CM

## 2023-05-14 DIAGNOSIS — Z955 Presence of coronary angioplasty implant and graft: Secondary | ICD-10-CM

## 2023-05-14 NOTE — Telephone Encounter (Signed)
 Can we refer him to [facility], as he requested?  Thanks.  Thayer Ohm

## 2023-05-28 ENCOUNTER — Other Ambulatory Visit: Payer: Self-pay | Admitting: Family Medicine

## 2023-05-28 DIAGNOSIS — F32A Depression, unspecified: Secondary | ICD-10-CM

## 2023-05-29 NOTE — Telephone Encounter (Signed)
 All controlled substance refill requests must have an up-to-date visit in person Patient last seen for his anxiety and depression was 10/26/2022.  Please call patient and schedule him for his chronic condition management if he would like refills on his medication.
